# Patient Record
Sex: Male | Born: 1940 | Race: Black or African American | Hispanic: No | Marital: Single | Smoking: Former smoker
Health system: Southern US, Community
[De-identification: ages and names within clinical notes are randomized; demographics above are authoritative.]

## PROBLEM LIST (undated history)

## (undated) DIAGNOSIS — E785 Hyperlipidemia, unspecified: Secondary | ICD-10-CM

## (undated) DIAGNOSIS — H409 Unspecified glaucoma: Secondary | ICD-10-CM

## (undated) DIAGNOSIS — I10 Essential (primary) hypertension: Secondary | ICD-10-CM

## (undated) DIAGNOSIS — E119 Type 2 diabetes mellitus without complications: Secondary | ICD-10-CM

## (undated) HISTORY — PX: EYE SURGERY: SHX253

---

## 2008-04-17 ENCOUNTER — Emergency Department: Payer: Self-pay | Admitting: Emergency Medicine

## 2010-05-10 ENCOUNTER — Emergency Department: Payer: Self-pay | Admitting: Unknown Physician Specialty

## 2012-05-04 ENCOUNTER — Encounter: Payer: Self-pay | Admitting: Geriatric Medicine

## 2012-05-17 ENCOUNTER — Encounter: Payer: Self-pay | Admitting: Geriatric Medicine

## 2012-08-17 ENCOUNTER — Encounter (INDEPENDENT_AMBULATORY_CARE_PROVIDER_SITE_OTHER): Payer: Medicare Other | Admitting: Ophthalmology

## 2012-08-17 DIAGNOSIS — I10 Essential (primary) hypertension: Secondary | ICD-10-CM

## 2012-08-17 DIAGNOSIS — H2510 Age-related nuclear cataract, unspecified eye: Secondary | ICD-10-CM

## 2012-08-17 DIAGNOSIS — H348192 Central retinal vein occlusion, unspecified eye, stable: Secondary | ICD-10-CM

## 2012-08-17 DIAGNOSIS — H43819 Vitreous degeneration, unspecified eye: Secondary | ICD-10-CM

## 2012-08-17 DIAGNOSIS — E1139 Type 2 diabetes mellitus with other diabetic ophthalmic complication: Secondary | ICD-10-CM

## 2012-08-17 DIAGNOSIS — E11319 Type 2 diabetes mellitus with unspecified diabetic retinopathy without macular edema: Secondary | ICD-10-CM

## 2012-08-17 DIAGNOSIS — H35039 Hypertensive retinopathy, unspecified eye: Secondary | ICD-10-CM

## 2012-09-19 ENCOUNTER — Encounter (INDEPENDENT_AMBULATORY_CARE_PROVIDER_SITE_OTHER): Payer: Medicare Other | Admitting: Ophthalmology

## 2012-09-19 DIAGNOSIS — E1165 Type 2 diabetes mellitus with hyperglycemia: Secondary | ICD-10-CM

## 2012-09-19 DIAGNOSIS — H43819 Vitreous degeneration, unspecified eye: Secondary | ICD-10-CM

## 2012-09-19 DIAGNOSIS — H2510 Age-related nuclear cataract, unspecified eye: Secondary | ICD-10-CM

## 2012-09-19 DIAGNOSIS — E1139 Type 2 diabetes mellitus with other diabetic ophthalmic complication: Secondary | ICD-10-CM

## 2012-09-19 DIAGNOSIS — H348192 Central retinal vein occlusion, unspecified eye, stable: Secondary | ICD-10-CM

## 2012-09-19 DIAGNOSIS — E11319 Type 2 diabetes mellitus with unspecified diabetic retinopathy without macular edema: Secondary | ICD-10-CM

## 2012-09-19 DIAGNOSIS — H35039 Hypertensive retinopathy, unspecified eye: Secondary | ICD-10-CM

## 2012-09-19 DIAGNOSIS — I10 Essential (primary) hypertension: Secondary | ICD-10-CM

## 2012-09-20 ENCOUNTER — Encounter (INDEPENDENT_AMBULATORY_CARE_PROVIDER_SITE_OTHER): Payer: Medicare Other | Admitting: Ophthalmology

## 2012-10-21 ENCOUNTER — Encounter (INDEPENDENT_AMBULATORY_CARE_PROVIDER_SITE_OTHER): Payer: Medicare Other | Admitting: Ophthalmology

## 2012-10-21 DIAGNOSIS — E1139 Type 2 diabetes mellitus with other diabetic ophthalmic complication: Secondary | ICD-10-CM

## 2012-10-21 DIAGNOSIS — H35039 Hypertensive retinopathy, unspecified eye: Secondary | ICD-10-CM

## 2012-10-21 DIAGNOSIS — H348192 Central retinal vein occlusion, unspecified eye, stable: Secondary | ICD-10-CM

## 2012-10-21 DIAGNOSIS — H43819 Vitreous degeneration, unspecified eye: Secondary | ICD-10-CM

## 2012-10-21 DIAGNOSIS — E11319 Type 2 diabetes mellitus with unspecified diabetic retinopathy without macular edema: Secondary | ICD-10-CM

## 2012-10-21 DIAGNOSIS — I10 Essential (primary) hypertension: Secondary | ICD-10-CM

## 2012-12-05 ENCOUNTER — Encounter (INDEPENDENT_AMBULATORY_CARE_PROVIDER_SITE_OTHER): Payer: Medicare Other | Admitting: Ophthalmology

## 2012-12-05 DIAGNOSIS — H35039 Hypertensive retinopathy, unspecified eye: Secondary | ICD-10-CM

## 2012-12-05 DIAGNOSIS — I10 Essential (primary) hypertension: Secondary | ICD-10-CM

## 2012-12-05 DIAGNOSIS — H348192 Central retinal vein occlusion, unspecified eye, stable: Secondary | ICD-10-CM

## 2012-12-05 DIAGNOSIS — E1139 Type 2 diabetes mellitus with other diabetic ophthalmic complication: Secondary | ICD-10-CM

## 2012-12-05 DIAGNOSIS — H2510 Age-related nuclear cataract, unspecified eye: Secondary | ICD-10-CM

## 2012-12-05 DIAGNOSIS — E11319 Type 2 diabetes mellitus with unspecified diabetic retinopathy without macular edema: Secondary | ICD-10-CM

## 2012-12-05 DIAGNOSIS — H43819 Vitreous degeneration, unspecified eye: Secondary | ICD-10-CM

## 2013-12-20 ENCOUNTER — Ambulatory Visit (INDEPENDENT_AMBULATORY_CARE_PROVIDER_SITE_OTHER): Payer: Medicare Other | Admitting: Ophthalmology

## 2013-12-20 DIAGNOSIS — E11329 Type 2 diabetes mellitus with mild nonproliferative diabetic retinopathy without macular edema: Secondary | ICD-10-CM | POA: Diagnosis not present

## 2013-12-20 DIAGNOSIS — E11319 Type 2 diabetes mellitus with unspecified diabetic retinopathy without macular edema: Secondary | ICD-10-CM | POA: Diagnosis not present

## 2013-12-20 DIAGNOSIS — H35033 Hypertensive retinopathy, bilateral: Secondary | ICD-10-CM | POA: Diagnosis not present

## 2013-12-20 DIAGNOSIS — H34811 Central retinal vein occlusion, right eye: Secondary | ICD-10-CM

## 2013-12-20 DIAGNOSIS — H43813 Vitreous degeneration, bilateral: Secondary | ICD-10-CM | POA: Diagnosis not present

## 2013-12-20 DIAGNOSIS — I10 Essential (primary) hypertension: Secondary | ICD-10-CM

## 2015-01-04 ENCOUNTER — Ambulatory Visit (INDEPENDENT_AMBULATORY_CARE_PROVIDER_SITE_OTHER): Payer: Medicare Other | Admitting: Ophthalmology

## 2015-01-22 ENCOUNTER — Ambulatory Visit (INDEPENDENT_AMBULATORY_CARE_PROVIDER_SITE_OTHER): Payer: Medicare Other | Admitting: Ophthalmology

## 2015-01-22 DIAGNOSIS — H348112 Central retinal vein occlusion, right eye, stable: Secondary | ICD-10-CM | POA: Diagnosis not present

## 2015-01-22 DIAGNOSIS — H43813 Vitreous degeneration, bilateral: Secondary | ICD-10-CM | POA: Diagnosis not present

## 2015-01-22 DIAGNOSIS — H35033 Hypertensive retinopathy, bilateral: Secondary | ICD-10-CM

## 2015-01-22 DIAGNOSIS — I10 Essential (primary) hypertension: Secondary | ICD-10-CM

## 2015-01-22 DIAGNOSIS — E11319 Type 2 diabetes mellitus with unspecified diabetic retinopathy without macular edema: Secondary | ICD-10-CM | POA: Diagnosis not present

## 2015-01-22 DIAGNOSIS — E113293 Type 2 diabetes mellitus with mild nonproliferative diabetic retinopathy without macular edema, bilateral: Secondary | ICD-10-CM | POA: Diagnosis not present

## 2015-08-29 DIAGNOSIS — Z9181 History of falling: Secondary | ICD-10-CM | POA: Insufficient documentation

## 2015-09-18 DIAGNOSIS — I10 Essential (primary) hypertension: Secondary | ICD-10-CM | POA: Insufficient documentation

## 2015-09-18 DIAGNOSIS — I671 Cerebral aneurysm, nonruptured: Secondary | ICD-10-CM | POA: Insufficient documentation

## 2015-09-18 DIAGNOSIS — H8113 Benign paroxysmal vertigo, bilateral: Secondary | ICD-10-CM | POA: Insufficient documentation

## 2015-10-22 DIAGNOSIS — I700 Atherosclerosis of aorta: Secondary | ICD-10-CM | POA: Insufficient documentation

## 2015-10-28 ENCOUNTER — Encounter (INDEPENDENT_AMBULATORY_CARE_PROVIDER_SITE_OTHER): Payer: Medicare Other | Admitting: Ophthalmology

## 2015-12-04 ENCOUNTER — Ambulatory Visit: Payer: Medicare Other | Admitting: Podiatry

## 2016-01-23 ENCOUNTER — Ambulatory Visit (INDEPENDENT_AMBULATORY_CARE_PROVIDER_SITE_OTHER): Payer: Medicare Other | Admitting: Ophthalmology

## 2016-01-23 DIAGNOSIS — H43813 Vitreous degeneration, bilateral: Secondary | ICD-10-CM | POA: Diagnosis not present

## 2016-01-23 DIAGNOSIS — I10 Essential (primary) hypertension: Secondary | ICD-10-CM | POA: Diagnosis not present

## 2016-01-23 DIAGNOSIS — E11319 Type 2 diabetes mellitus with unspecified diabetic retinopathy without macular edema: Secondary | ICD-10-CM | POA: Diagnosis not present

## 2016-01-23 DIAGNOSIS — H348112 Central retinal vein occlusion, right eye, stable: Secondary | ICD-10-CM

## 2016-01-23 DIAGNOSIS — E113293 Type 2 diabetes mellitus with mild nonproliferative diabetic retinopathy without macular edema, bilateral: Secondary | ICD-10-CM | POA: Diagnosis not present

## 2016-01-23 DIAGNOSIS — H35033 Hypertensive retinopathy, bilateral: Secondary | ICD-10-CM | POA: Diagnosis not present

## 2017-01-27 ENCOUNTER — Ambulatory Visit (INDEPENDENT_AMBULATORY_CARE_PROVIDER_SITE_OTHER): Payer: Medicare Other | Admitting: Ophthalmology

## 2017-01-27 ENCOUNTER — Encounter (INDEPENDENT_AMBULATORY_CARE_PROVIDER_SITE_OTHER): Payer: Medicare Other | Admitting: Ophthalmology

## 2017-02-07 ENCOUNTER — Encounter: Payer: Self-pay | Admitting: Emergency Medicine

## 2017-02-07 ENCOUNTER — Ambulatory Visit
Admission: EM | Admit: 2017-02-07 | Discharge: 2017-02-07 | Disposition: A | Discharge status: Home / Self Care | Payer: Medicare Other | Attending: Emergency Medicine | Admitting: Emergency Medicine

## 2017-02-07 ENCOUNTER — Other Ambulatory Visit: Payer: Self-pay

## 2017-02-07 ENCOUNTER — Ambulatory Visit (INDEPENDENT_AMBULATORY_CARE_PROVIDER_SITE_OTHER): Payer: Medicare Other

## 2017-02-07 DIAGNOSIS — J069 Acute upper respiratory infection, unspecified: Secondary | ICD-10-CM | POA: Diagnosis not present

## 2017-02-07 DIAGNOSIS — R05 Cough: Secondary | ICD-10-CM | POA: Diagnosis not present

## 2017-02-07 HISTORY — DX: Type 2 diabetes mellitus without complications: E11.9

## 2017-02-07 HISTORY — DX: Unspecified glaucoma: H40.9

## 2017-02-07 HISTORY — DX: Hyperlipidemia, unspecified: E78.5

## 2017-02-07 HISTORY — DX: Essential (primary) hypertension: I10

## 2017-02-07 MED ORDER — BENZONATATE 200 MG PO CAPS: 200.0000 mg | ORAL_CAPSULE | Freq: Three times a day (TID) | ORAL | 0 refills | Status: DC | PRN

## 2017-02-07 MED ORDER — FLUTICASONE PROPIONATE 50 MCG/ACT NA SUSP: 2.0000 | Freq: Every day | NASAL | 0 refills | Status: DC

## 2017-02-07 MED ORDER — DOXYCYCLINE HYCLATE 100 MG PO CAPS: 100.0000 mg | ORAL_CAPSULE | Freq: Two times a day (BID) | ORAL | 0 refills | Status: AC

## 2017-02-07 NOTE — ED Provider Notes (Signed)
HPI  SUBJECTIVE:  Evan Gonzalez is a 76 y.o. male who presents with a nonproductive cough, nasal congestion starting yesterday.  He reports feeling feverish but has no documented fevers as he did not take his temperature.  He reports shortness of breath with the cough only denies any other shortness of breath.  He reports lower abdominal soreness from the cough.  States that he had a burning throat yesterday while coughing.  He tried Alka-Seltzer cold and flu and Vicks VapoRub.  The Alka-Seltzer helped.  No aggravating factors.  No rhinorrhea, postnasal drip, sinus pain or pressure.  No wheezing, chest pain.  No burning chest pain, belching, water brash.  No body aches, headaches.  No antibiotics in the past month.  No antipyretic in the past 6-8 hours.  Past medical history of obesity, diabetes, HTN for which he takes lisinopril.  No history of GERD, asthma, emphysema, COPD, smoking, CHF.  PMD: Harlow Ohms, MD  Past Medical History:  Diagnosis Date  . Diabetes mellitus without complication ([place])   . Glaucoma   . Hyperlipidemia   . Hypertension     Past Surgical History:  Procedure Laterality Date  . EYE SURGERY      Family History  Problem Relation Age of Onset  . Heart attack Mother   . Diabetes Father     Social History   Tobacco Use  . Smoking status: Former Smoker    Last attempt to quit: 02/08/1987    Years since quitting: 30.0  . Smokeless tobacco: Never Used  Substance Use Topics  . Alcohol use: No    Frequency: Never  . Drug use: No    No current facility-administered medications for this encounter.   Current Outpatient Medications:  .  dipyridamole-aspirin (AGGRENOX) 200-25 MG 12hr capsule, Take 1 capsule by mouth 2 (two) times daily., Disp: , Rfl:  .  dorzolamide (TRUSOPT) 2 % ophthalmic solution, Place 1 drop into both eyes 2 (two) times daily., Disp: , Rfl:  .  glipiZIDE (GLUCOTROL) 5 MG tablet, Take 5 mg by mouth daily before breakfast., Disp: , Rfl:   .  latanoprost (XALATAN) 0.005 % ophthalmic solution, Place 1 drop into both eyes at bedtime., Disp: , Rfl:  .  lisinopril (PRINIVIL,ZESTRIL) 5 MG tablet, Take 5 mg by mouth daily., Disp: , Rfl:  .  metFORMIN (GLUCOPHAGE) 500 MG tablet, Take 500 mg by mouth 2 (two) times daily with a meal., Disp: , Rfl:  .  simvastatin (ZOCOR) 20 MG tablet, Take 20 mg by mouth at bedtime., Disp: , Rfl:  .  benzonatate (TESSALON) 200 MG capsule, Take 1 capsule (200 mg total) by mouth 3 (three) times daily as needed for cough., Disp: 30 capsule, Rfl: 0 .  doxycycline (VIBRAMYCIN) 100 MG capsule, Take 1 capsule (100 mg total) by mouth 2 (two) times daily for 7 days., Disp: 14 capsule, Rfl: 0 .  fluticasone (FLONASE) 50 MCG/ACT nasal spray, Place 2 sprays into both nostrils daily., Disp: 16 g, Rfl: 0  No Known Allergies   ROS  As noted in HPI.   Physical Exam  BP 127/70 (BP Location: Left Arm)   Pulse 91   Temp 99.1 F (37.3 C) (Oral)   Resp 16   Ht 5\' 11"  (1.803 m)   Wt 266 lb (120.7 kg)   SpO2 98%   BMI 37.10 kg/m   Constitutional: Well developed, well nourished, no acute distress, obese Eyes:  EOMI, conjunctiva normal bilaterally HENT: Normocephalic, atraumatic,mucus membranes moist  positive clear rhinorrhea.  Slightly erythematous, swollen turbinates.  No sinus tenderness.  Positive cobblestoning with postnasal drip. Respiratory: Normal inspiratory effort, lungs clear bilaterally.  Good air movement.  No chest wall tenderness. Cardiovascular: Normal rate regular rhythm, no murmurs, rubs, gallops GI: nondistended soft.  No abdominal wall tenderness skin: No rash, skin intact Musculoskeletal: no deformities Neurologic: Alert & oriented x 3, no focal neuro deficits Psychiatric: Speech and behavior appropriate   ED Course   Medications - No data to display  Orders Placed This Encounter  Procedures  . DG Chest 2 View    Standing Status:   Standing    Number of Occurrences:   1    Order  Specific Question:   Reason for Exam (SYMPTOM  OR DIAGNOSIS REQUIRED)    Answer:   r/o PNA pulm edema effusion    No results found for this or any previous visit (from the past 24 hour(s)). Dg Chest 2 View  Result Date: 02/07/2017 CLINICAL DATA:  Patient in today c/o cough since yesterday. Patient states it was productive (green) last night, but has been a dry cough today. Patient felt feverish yesterday, but didn't check his temperature. Patient has tried Copywriter, advertising Flu/Cold but unsure if that has helped. Pt also states he has coughed so much his lower abdomen hurts. Former smoker. EXAM: CHEST  2 VIEW COMPARISON:  None. FINDINGS: Cardiac silhouette is normal in size. No mediastinal or hilar masses. No evidence of adenopathy. Lungs are clear.  No pleural effusion or pneumothorax. The skeletal structures are intact. IMPRESSION: No active cardiopulmonary disease. Electronically Signed   By: Lajean Manes M.D.   On: 02/07/2017 16:15    ED Clinical Impression  Upper respiratory tract infection, unspecified type   ED Assessment/Plan  Reviewed imaging independently and discussed with rads.  No acute cardiopulmonary disease per radiology.  see radiology report for full details. However I see a hyperdensity on the lateral x ray-may be chronic bronchitic changes per radiology.  Patient afebrile, satting well on room air, has not taken antipyretic in the past 6-8 hours.  Presentation consistent with a URI, will send home with Tessalon, Flonase.  We will send home with a wait-and-see prescription of doxycycline 100 mg p.o. twice daily for 7 days because of the x-ray.  He will follow-up with his primary care physician in several days, and will go to the ER if he gets worse despite the Tessalon, Flonase and the doxycycline.  Discussed imaging, MDM, plan and followup with patient. Discussed sn/sx that should prompt return to the ED. patient agrees with plan.   Meds ordered this encounter  Medications   . doxycycline (VIBRAMYCIN) 100 MG capsule    Sig: Take 1 capsule (100 mg total) by mouth 2 (two) times daily for 7 days.    Dispense:  14 capsule    Refill:  0  . benzonatate (TESSALON) 200 MG capsule    Sig: Take 1 capsule (200 mg total) by mouth 3 (three) times daily as needed for cough.    Dispense:  30 capsule    Refill:  0  . fluticasone (FLONASE) 50 MCG/ACT nasal spray    Sig: Place 2 sprays into both nostrils daily.    Dispense:  16 g    Refill:  0    *This clinic note was created using [employer]. Therefore, there may be occasional mistakes despite careful proofreading.   ?   Melynda Ripple, MD 02/07/17 1729

## 2017-02-07 NOTE — ED Triage Notes (Signed)
Patient states he has coughed so much, that his abdomen is sore. Rates pain 10/10

## 2017-02-07 NOTE — ED Triage Notes (Signed)
Patient in today c/o cough since yesterday. Patient states it was productive (green) last night, but has been a dry cough today. Patient felt feverish yesterday, but didn't check his temperature. Patient has tried Copywriter, advertising Flu/Cold but unsure if that has helped.

## 2017-02-07 NOTE — Discharge Instructions (Signed)
Start some Mucinex in addition to your cold medicines.  Tessalon for the cough, Flonase for the nasal congestion.  Wait to start the doxycycline.

## 2017-02-10 DIAGNOSIS — M75101 Unspecified rotator cuff tear or rupture of right shoulder, not specified as traumatic: Secondary | ICD-10-CM | POA: Insufficient documentation

## 2017-03-04 ENCOUNTER — Encounter (INDEPENDENT_AMBULATORY_CARE_PROVIDER_SITE_OTHER): Payer: Medicare Other | Admitting: Ophthalmology

## 2017-03-04 DIAGNOSIS — E113293 Type 2 diabetes mellitus with mild nonproliferative diabetic retinopathy without macular edema, bilateral: Secondary | ICD-10-CM | POA: Diagnosis not present

## 2017-03-04 DIAGNOSIS — I10 Essential (primary) hypertension: Secondary | ICD-10-CM | POA: Diagnosis not present

## 2017-03-04 DIAGNOSIS — H35033 Hypertensive retinopathy, bilateral: Secondary | ICD-10-CM | POA: Diagnosis not present

## 2017-03-04 DIAGNOSIS — E11319 Type 2 diabetes mellitus with unspecified diabetic retinopathy without macular edema: Secondary | ICD-10-CM

## 2017-03-04 DIAGNOSIS — H348112 Central retinal vein occlusion, right eye, stable: Secondary | ICD-10-CM | POA: Diagnosis not present

## 2017-03-04 DIAGNOSIS — H43813 Vitreous degeneration, bilateral: Secondary | ICD-10-CM | POA: Diagnosis not present

## 2017-09-14 ENCOUNTER — Ambulatory Visit
Admission: EM | Admit: 2017-09-14 | Discharge: 2017-09-14 | Disposition: A | Discharge status: Home / Self Care | Payer: Medicare Other | Attending: Family Medicine | Admitting: Family Medicine

## 2017-09-14 ENCOUNTER — Ambulatory Visit (INDEPENDENT_AMBULATORY_CARE_PROVIDER_SITE_OTHER): Payer: Medicare Other

## 2017-09-14 ENCOUNTER — Other Ambulatory Visit: Payer: Self-pay

## 2017-09-14 ENCOUNTER — Encounter: Payer: Self-pay | Admitting: Emergency Medicine

## 2017-09-14 DIAGNOSIS — M25559 Pain in unspecified hip: Secondary | ICD-10-CM

## 2017-09-14 DIAGNOSIS — M25551 Pain in right hip: Secondary | ICD-10-CM | POA: Diagnosis not present

## 2017-09-14 NOTE — ED Triage Notes (Signed)
Patient c/o right hip pain that goes down his right leg for the past 3 days.

## 2017-09-14 NOTE — ED Provider Notes (Signed)
[facility]    CSN: [id number] Arrival date & time: 09/14/17  1449     History   Chief Complaint Chief Complaint  Patient presents with  . Hip Pain    right    HPI Evan Gonzalez is a 77 y.o. male.   77 yo male with a c/o right hip pain radiating down the leg for 3 days. Denies any falls or other traumatic injury. Denies fevers, chills, or rash.   The history is provided by the patient.  Hip Pain     Past Medical History:  Diagnosis Date  . Diabetes mellitus without complication ([place])   . Glaucoma   . Hyperlipidemia   . Hypertension     There are no active problems to display for this patient.   Past Surgical History:  Procedure Laterality Date  . EYE SURGERY         Home Medications    Prior to Admission medications   Medication Sig Start Date End Date Taking? Authorizing Provider  dipyridamole-aspirin (AGGRENOX) 200-25 MG 12hr capsule Take 1 capsule by mouth 2 (two) times daily.   Yes [provider]  dorzolamide (TRUSOPT) 2 % ophthalmic solution Place 1 drop into both eyes 2 (two) times daily.   Yes [provider]  glipiZIDE (GLUCOTROL) 5 MG tablet Take 5 mg by mouth daily before breakfast.   Yes [provider]  latanoprost (XALATAN) 0.005 % ophthalmic solution Place 1 drop into both eyes at bedtime.   Yes [provider]  lisinopril (PRINIVIL,ZESTRIL) 5 MG tablet Take 5 mg by mouth daily.   Yes [provider]  metFORMIN (GLUCOPHAGE) 500 MG tablet Take 500 mg by mouth 2 (two) times daily with a meal.   Yes [provider]  simvastatin (ZOCOR) 20 MG tablet Take 20 mg by mouth at bedtime.   Yes [provider]  benzonatate (TESSALON) 200 MG capsule Take 1 capsule (200 mg total) by mouth 3 (three) times daily as needed for cough. 02/07/17   Melynda Ripple, MD  fluticasone (FLONASE) 50 MCG/ACT nasal spray Place 2 sprays into both nostrils daily. 02/07/17   Melynda Ripple,  MD    Family History Family History  Problem Relation Age of Onset  . Heart attack Mother   . Diabetes Father     Social History Social History   Tobacco Use  . Smoking status: Former Smoker    Last attempt to quit: 02/08/1987    Years since quitting: 30.6  . Smokeless tobacco: Never Used  Substance Use Topics  . Alcohol use: No    Frequency: Never  . Drug use: No     Allergies   Patient has no known allergies.   Review of Systems Review of Systems   Physical Exam Triage Vital Signs ED Triage Vitals  Enc Vitals Group     BP 09/14/17 1519 (!) 130/94     Pulse Rate 09/14/17 1519 63     Resp 09/14/17 1519 16     Temp 09/14/17 1519 98 F (36.7 C)     Temp Source 09/14/17 1519 Oral     SpO2 09/14/17 1519 98 %     Weight 09/14/17 1517 272 lb (123.4 kg)     Height 09/14/17 1517 5\' 11"  (1.803 m)     Head Circumference --      Peak Flow --      Pain Score 09/14/17 1517 10     Pain Loc --  Pain Edu? --      Excl. in [place]? --    No data found.  Updated Vital Signs BP (!) 130/94 (BP Location: Right Arm)   Pulse 63   Temp 98 F (36.7 C) (Oral)   Resp 16   Ht 5\' 11"  (1.803 m)   Wt 272 lb (123.4 kg)   SpO2 98%   BMI 37.94 kg/m   Visual Acuity Right Eye Distance:   Left Eye Distance:   Bilateral Distance:    Right Eye Near:   Left Eye Near:    Bilateral Near:     Physical Exam  Constitutional: He appears well-developed and well-nourished. No distress.  Musculoskeletal:       Right hip: He exhibits tenderness. He exhibits normal range of motion, normal strength, no bony tenderness, no swelling, no crepitus, no deformity and no laceration.  Skin: He is not diaphoretic.  Nursing note and vitals reviewed.    UC Treatments / Results  Labs (all labs ordered are listed, but only abnormal results are displayed) Labs Reviewed - No data to display  EKG None  Radiology Dg Hip Unilat With Pelvis 2-3 Views Right  Result Date: 09/14/2017 CLINICAL  DATA:  Right posterior hip pain EXAM: DG HIP (WITH OR WITHOUT PELVIS) 2-3V RIGHT COMPARISON:  None. FINDINGS: Mild SI joint degenerative change. No fracture or malalignment. Joint space is within normal limits. Pubic symphysis and rami are intact. IMPRESSION: No acute osseous abnormality. Electronically Signed   By: Donavan Foil M.D.   On: 09/14/2017 15:59    Procedures Procedures (including critical care time)  Medications Ordered in UC Medications - No data to display  Initial Impression / Assessment and Plan / UC Course  I have reviewed the triage vital signs and the nursing notes.  Pertinent labs & imaging results that were available during my care of the patient were reviewed by me and considered in my medical decision making (see chart for details).      Final Clinical Impressions(s) / UC Diagnoses   Final diagnoses:  Hip pain     Discharge Instructions     Over the counter tylenol/advil  Heat to area    ED Prescriptions    None     1. x-ray results and diagnosis reviewed with patient 2. rx as per orders above; reviewed possible side effects, interactions, risks and benefits  3. Recommend supportive treatment as above 4. Follow-up prn if symptoms worsen or don't improve   Controlled Substance Prescriptions [place] Controlled Substance Registry consulted? Not Applicable   Norval Gable, MD 09/14/17 [phone]

## 2017-09-14 NOTE — Discharge Instructions (Signed)
Over the counter tylenol/advil  Heat to area

## 2017-09-24 ENCOUNTER — Emergency Department
Admission: EM | Admit: 2017-09-24 | Discharge: 2017-09-24 | Disposition: A | Discharge status: Home / Self Care | Payer: Medicare Other | Attending: Emergency Medicine | Admitting: Emergency Medicine

## 2017-09-24 ENCOUNTER — Other Ambulatory Visit: Payer: Self-pay

## 2017-09-24 ENCOUNTER — Emergency Department: Payer: Medicare Other

## 2017-09-24 DIAGNOSIS — Z87891 Personal history of nicotine dependence: Secondary | ICD-10-CM | POA: Insufficient documentation

## 2017-09-24 DIAGNOSIS — I10 Essential (primary) hypertension: Secondary | ICD-10-CM | POA: Diagnosis not present

## 2017-09-24 DIAGNOSIS — Z7984 Long term (current) use of oral hypoglycemic drugs: Secondary | ICD-10-CM | POA: Diagnosis not present

## 2017-09-24 DIAGNOSIS — E119 Type 2 diabetes mellitus without complications: Secondary | ICD-10-CM | POA: Insufficient documentation

## 2017-09-24 DIAGNOSIS — R42 Dizziness and giddiness: Secondary | ICD-10-CM | POA: Diagnosis present

## 2017-09-24 LAB — URINALYSIS, ROUTINE W REFLEX MICROSCOPIC
BILIRUBIN URINE: NEGATIVE
GLUCOSE, UA: NEGATIVE mg/dL
HGB URINE DIPSTICK: NEGATIVE
Ketones, ur: NEGATIVE mg/dL
Leukocytes, UA: NEGATIVE
Nitrite: NEGATIVE
PROTEIN: NEGATIVE mg/dL
Specific Gravity, Urine: 1.031 — ABNORMAL HIGH (ref 1.005–1.030)
pH: 5 (ref 5.0–8.0)

## 2017-09-24 LAB — COMPREHENSIVE METABOLIC PANEL
ALBUMIN: 3.8 g/dL (ref 3.5–5.0)
ALT: 22 U/L (ref 0–44)
AST: 24 U/L (ref 15–41)
Alkaline Phosphatase: 36 U/L — ABNORMAL LOW (ref 38–126)
Anion gap: 7 (ref 5–15)
BUN: 10 mg/dL (ref 8–23)
CO2: 25 mmol/L (ref 22–32)
CREATININE: 0.88 mg/dL (ref 0.61–1.24)
Calcium: 8.8 mg/dL — ABNORMAL LOW (ref 8.9–10.3)
Chloride: 110 mmol/L (ref 98–111)
GFR calc non Af Amer: >60 mL/min (ref >60)
Glucose, Bld: 121 mg/dL — ABNORMAL HIGH (ref 70–99)
Potassium: 4 mmol/L (ref 3.5–5.1)
SODIUM: 142 mmol/L (ref 135–145)
TOTAL PROTEIN: 7.6 g/dL (ref 6.5–8.1)
Total Bilirubin: 0.7 mg/dL (ref 0.3–1.2)

## 2017-09-24 LAB — CBC
HCT: 41.7 % (ref 40.0–52.0)
Hemoglobin: 14.3 g/dL (ref 13.0–18.0)
MCH: 31.6 pg (ref 26.0–34.0)
MCHC: 34.2 g/dL (ref 32.0–36.0)
MCV: 92.2 fL (ref 80.0–100.0)
PLATELETS: 202 10*3/uL (ref 150–440)
RBC: 4.52 MIL/uL (ref 4.40–5.90)
RDW: 14.6 % — AB (ref 11.5–14.5)
WBC: 6 10*3/uL (ref 3.8–10.6)

## 2017-09-24 LAB — TROPONIN I: Troponin I: <0.03 ng/mL (ref <0.03)

## 2017-09-24 MED ORDER — MECLIZINE HCL 25 MG PO TABS: 25.0000 mg | ORAL_TABLET | Freq: Three times a day (TID) | ORAL | 0 refills | Status: DC | PRN

## 2017-09-24 MED ORDER — MECLIZINE HCL 25 MG PO TABS
25.0000 mg | ORAL_TABLET | Freq: Once | ORAL | Status: AC
  Administered 2017-09-24: 25 mg via ORAL
  Filled 2017-09-24: qty 1

## 2017-09-24 MED ORDER — SODIUM CHLORIDE 0.9 % IV BOLUS
1000.0000 mL | Freq: Once | INTRAVENOUS | Status: AC
  Administered 2017-09-24: 1000 mL via INTRAVENOUS

## 2017-09-24 NOTE — ED Notes (Signed)
This RN to bedside at this time, pt given another cup of water and encouraged to urinate. This RN offered in and out cath to get urine if patient was unable to provide specimen. Pt states he will drink water and attempt for urine sample.

## 2017-09-24 NOTE — ED Provider Notes (Signed)
[facility] Emergency Department Provider Note  Time seen: 9:23 AM  I have reviewed the triage vital signs and the nursing notes.   HISTORY  Chief Complaint Dizziness    HPI KYSON KUPPER is a 77 y.o. male with a past medical history of diabetes, hypertension, hyperlipidemia, CVA, presents to the emergency department for dizziness.  According to the patient he woke this morning around 6:00 with dizziness.  States he felt fine when he went to bed last night.  States he woke up and sat up on the side of the bed and felt very dizzy became nauseated and had an episode of vomiting.  Describes the dizziness as the room spinning around him.  Denies any headache, weakness or numbness of any arm or leg, confusion or difficulty speaking.  Patient states approximately 30 years ago he was told he had a small stroke which affected his right side at that time but completely resolved.  He states at that time he was also having dizziness but has not had any significant dizziness since then until this morning.  Patient denies any chest pain or trouble breathing, does state right hip pain but states this is a chronic issue for him.   Past Medical History:  Diagnosis Date  . Diabetes mellitus without complication ([place])   . Glaucoma   . Hyperlipidemia   . Hypertension     There are no active problems to display for this patient.   Past Surgical History:  Procedure Laterality Date  . EYE SURGERY      Prior to Admission medications   Medication Sig Start Date End Date Taking? Authorizing Provider  benzonatate (TESSALON) 200 MG capsule Take 1 capsule (200 mg total) by mouth 3 (three) times daily as needed for cough. 02/07/17   Melynda Ripple, MD  dipyridamole-aspirin (AGGRENOX) 200-25 MG 12hr capsule Take 1 capsule by mouth 2 (two) times daily.    [provider]  dorzolamide (TRUSOPT) 2 % ophthalmic solution Place 1 drop into both eyes 2 (two) times daily.     [provider]  fluticasone (FLONASE) 50 MCG/ACT nasal spray Place 2 sprays into both nostrils daily. 02/07/17   Melynda Ripple, MD  glipiZIDE (GLUCOTROL) 5 MG tablet Take 5 mg by mouth daily before breakfast.    [provider]  latanoprost (XALATAN) 0.005 % ophthalmic solution Place 1 drop into both eyes at bedtime.    [provider]  lisinopril (PRINIVIL,ZESTRIL) 5 MG tablet Take 5 mg by mouth daily.    [provider]  metFORMIN (GLUCOPHAGE) 500 MG tablet Take 500 mg by mouth 2 (two) times daily with a meal.    [provider]  simvastatin (ZOCOR) 20 MG tablet Take 20 mg by mouth at bedtime.    [provider]    No Known Allergies  Family History  Problem Relation Age of Onset  . Heart attack Mother   . Diabetes Father     Social History Social History   Tobacco Use  . Smoking status: Former Smoker    Last attempt to quit: 02/08/1987    Years since quitting: 30.6  . Smokeless tobacco: Never Used  Substance Use Topics  . Alcohol use: No    Frequency: Never  . Drug use: No    Review of Systems Constitutional: Negative for fever.  Positive for dizziness. Eyes: Felt like the room is spinning. ENT: Negative for recent illness/congestion or ear pain. Cardiovascular: Negative for chest pain. Respiratory: Negative for  shortness of breath. Gastrointestinal: Negative for abdominal pain.  One episode of nausea vomiting this morning. Genitourinary: Negative for urinary compaints Musculoskeletal: Chronic right hip pain Skin: Negative for skin complaints  Neurological: Negative for headache, denies weakness or numbness. All other ROS negative  ____________________________________________   PHYSICAL EXAM:  VITAL SIGNS: ED Triage Vitals  Enc Vitals Group     BP 09/24/17 0907 132/74     Pulse Rate 09/24/17 0905 (!) 57     Resp 09/24/17 0905 16     Temp 09/24/17 0905 98.5 F (36.9 C)     Temp Source 09/24/17 0905  Oral     SpO2 09/24/17 0905 97 %     Weight 09/24/17 0906 278 lb (126.1 kg)     Height 09/24/17 0906 5\' 11"  (1.803 m)     Head Circumference --      Peak Flow --      Pain Score 09/24/17 0906 0     Pain Loc --      Pain Edu? --      Excl. in [place]? --    Constitutional: Alert and oriented. Well appearing and in no distress. Eyes: Normal exam ENT   Head: Normocephalic and atraumatic.   Mouth/Throat: Mucous membranes are moist. Cardiovascular: Normal rate, regular rhythm. No murmur Respiratory: Normal respiratory effort without tachypnea nor retractions. Breath sounds are clear Gastrointestinal: Soft and nontender. No distention.  Musculoskeletal: Nontender with normal range of motion in all extremities.  Neurologic:  Normal speech and language. No gross focal neurologic deficits.  Equal grip strengths.  No pronator drift.  5/5 motor in all extremities.  Cranial nerves intact. Skin:  Skin is warm, dry and intact.  Psychiatric: Mood and affect are normal.   ____________________________________________    EKG  EKG reviewed and interpreted by myself shows normal sinus rhythm at 61 bpm with a narrow QRS, left axis deviation, largely normal intervals with no concerning ST changes.  ____________________________________________    RADIOLOGY  CT had negative  ____________________________________________   INITIAL IMPRESSION / ASSESSMENT AND PLAN / ED COURSE  Pertinent labs & imaging results that were available during my care of the patient were reviewed by me and considered in my medical decision making (see chart for details).  Patient presents to the emergency department acute onset of dizziness after sitting up on the side of his bed this morning.  Describes the dizziness as a room spinning sensation.  Denies any headache confusion slurred speech weakness or numbness.  Normal neurological examination.  NIH stroke scale of 0 currently.  We will check labs, CT scan of the head  and continue to closely monitor.  Overall the patient appears well.  We will dose meclizine in the emergency department.  CT had negative for acute abnormality.  Labs are largely at baseline.  Highly suspect vertigo given the patient's description of symptoms.  States the dizziness has completely resolved at this time.  We will discharge with a course of vertigo.  I did discuss strict return precautions.  ____________________________________________   FINAL CLINICAL IMPRESSION(S) / ED DIAGNOSES  Dizziness    Harvest Dark, MD 09/24/17 1447

## 2017-09-24 NOTE — ED Notes (Signed)
Return from CT.  AAOx3.  Skin warm and dry. NAD

## 2017-09-24 NOTE — ED Notes (Signed)
This RN to bedside, pt back to bed, states unable to produce urine sample. Pt is alert and oriented, NAD noted at this time. Pt denies any needs. Will continue to monitor for further patient needs.

## 2017-09-24 NOTE — ED Notes (Signed)
Bladder scan performed per MD request, 109cc's in patient's bladder at this time. Pt denies any further needs. Encouraged urine sample when patient is able. Pt states understanding. MD made aware.

## 2017-09-24 NOTE — ED Notes (Signed)
UA collected by this RN, noted to be amber in appearance. MD notified.

## 2017-09-24 NOTE — ED Triage Notes (Signed)
Pt states when he was getting up out of bed this morning he had dizziness with N/V.Marland Kitchen Denies any pain or numbness.

## 2017-09-24 NOTE — ED Notes (Signed)
NAD noted at time of D/C. Pt denies questions or concerns. Pt ambulatory to the lobby at this time. PT refused wheelchair to the lobby.

## 2017-09-24 NOTE — ED Notes (Signed)
This RN to bedside to introduce self to patient, patient currently still in the bathroom with family at bedside. Will continue to monitor for further patient needs.

## 2017-10-06 DIAGNOSIS — M5441 Lumbago with sciatica, right side: Secondary | ICD-10-CM | POA: Insufficient documentation

## 2017-10-06 DIAGNOSIS — H6122 Impacted cerumen, left ear: Secondary | ICD-10-CM | POA: Insufficient documentation

## 2017-10-13 ENCOUNTER — Ambulatory Visit: Payer: Medicare Other | Attending: Gastroenterology | Admitting: Physical Therapy

## 2017-10-13 DIAGNOSIS — M25651 Stiffness of right hip, not elsewhere classified: Secondary | ICD-10-CM

## 2017-10-13 DIAGNOSIS — M5441 Lumbago with sciatica, right side: Secondary | ICD-10-CM | POA: Diagnosis present

## 2017-10-13 DIAGNOSIS — G8929 Other chronic pain: Secondary | ICD-10-CM | POA: Diagnosis present

## 2017-10-13 DIAGNOSIS — R262 Difficulty in walking, not elsewhere classified: Secondary | ICD-10-CM

## 2017-10-13 DIAGNOSIS — M6281 Muscle weakness (generalized): Secondary | ICD-10-CM | POA: Diagnosis present

## 2017-10-13 NOTE — Patient Instructions (Signed)
Access Code: [email or web address]  URL: [email or web address]  Date: 10/13/2017  Prepared by: Dorcas Carrow   Exercises  Standing Hamstring Stretch with Step - 3 reps - 1 sets - 20 hold - 1x daily - 7x weekly  Supine Active Straight Leg Raise - 15 reps - 2 sets - 1x daily - 7x weekly  Supine Lower Trunk Rotation - 10 reps - 1 sets - 1x daily - 7x weekly  Supine Figure 4 Piriformis Stretch - 3 reps - 1 sets - 20 hold - 1x daily - 7x weekly  Supine Piriformis Stretch with Leg Straight - 3 reps - 1 sets - 20 hold - 1x daily - 7x weekly

## 2017-10-15 NOTE — Therapy (Addendum)
[facility] [address] [place], [place], [address] Phone: [phone]   Fax:  [phone]  Physical Therapy Evaluation  Patient Details  Name: Evan Gonzalez MRN: [id number] Date of Birth: [birth year] Referring Provider: Rolene Arbour, NP   Encounter Date: 10/13/2017  PT End of Session - 10/24/17 1744    Visit Number  1    Number of Visits  8    Date for PT Re-Evaluation  11/10/17    PT Start Time  2878    PT Stop Time  1619    PT Time Calculation (min)  67 min    Activity Tolerance  Patient tolerated treatment well;No increased pain    Behavior During Therapy  WFL for tasks assessed/performed       Past Medical History:  Diagnosis Date  . Diabetes mellitus without complication ([place])   . Glaucoma   . Hyperlipidemia   . Hypertension     Past Surgical History:  Procedure Laterality Date  . EYE SURGERY      There were no vitals filed for this visit.   Subjective Assessment - 10/24/17 1248    Subjective  Pt. reports increase R sided low back pain.  Pt. reports no MOI and states he exercises at [employer] several days/week.  Pt. reports using 2 pillows on couch and plays X-box on a regular basis.      Pertinent History  Pt. received PT in past for shoulder issues with good results.  Pt. very motivated to get better/ pain-free.      Limitations  Sitting;Lifting;Standing;Walking    Patient Stated Goals  Decrease back pain/ return to gym with no limitations.      Currently in Pain?  Yes    Pain Score  2     Pain Location  Back    Pain Orientation  Right           See HEP    PT Education - 10/24/17 1744    Education Details  See HEP    Person(s) Educated  Patient    Methods  Explanation;Demonstration;Handout    Comprehension  Verbalized understanding;Returned demonstration          PT Long Term Goals - 10/24/17 1755      PT LONG TERM GOAL #1   Title  Pt. I with HEP to increase B hip  flexion strength 1/2 muscle grade to improve walking/ functional mobility.     Baseline  B hip flexion strength grossly 4/5 MMT    Time  4    Period  Weeks    Status  New    Target Date  11/10/17      PT LONG TERM GOAL #2   Title  Pt. will increase FOTO to 68 to improve pain-free mobility.     Baseline  FOTO baseline on 8/28 is 52.      Time  4    Period  Weeks    Status  New    Target Date  11/10/17      PT LONG TERM GOAL #3   Title  Pt. able to complete gym based ex. program at [employer] with no c/o R low back pain to promote return to PLOF.      Baseline  pain limited with lumbar flexion/ certain ex. at gym.     Time  4    Period  Weeks    Status  New    Target Date  11/10/17  Plan - 10/24/17 1745    Clinical Impression Statement  Pt. is a pleasant 77 y/o active male with acute right-sided low back pain with right-sided sciatica.  Pt. c/o 0-2/10 R low back pain at rest and 10/10 at worst.  Lumbar AROM: flexion 25% limited/ extension 50% limited/ increase R hip discomfort with lumbar rotn/ lateral flexion.  B LE AROM WFL and strength grossly 5/5 MMT except hip flexion 4/5 MMT.  (-) SLR.  B hamstring flexibility 50 deg.  Minimal tenderness to R lumbar paraspinals/ R hip greater trochanter.  Slight R antalgic gait pattern with initial steps after standing.  PT addressed proper seated/standing/sleeping posture.  Pt. prefers L side sleeping.  Pt. will benefit from short-term skilled PT services to increase lumbar/LE flexibility and core stability to improve pain-free mobility.      Clinical Presentation  Stable    Clinical Decision Making  Low    Rehab Potential  Good    PT Frequency  2x / week    PT Duration  4 weeks    PT Treatment/Interventions  ADLs/Self Care Home Management;Aquatic Therapy;Cryotherapy;Electrical Stimulation;Moist Heat;Stair training;Functional mobility training;Gait training;Neuromuscular re-education;Balance training;Therapeutic  exercise;Therapeutic activities;Patient/family education;Manual techniques;Dry needling;Passive range of motion    PT Next Visit Plan  Reassess HEP/ progress as tolerated.      PT Home Exercise Plan  See handouts.        Patient will benefit from skilled therapeutic intervention in order to improve the following deficits and impairments:  Abnormal gait, Decreased endurance, Decreased mobility, Hypomobility, Obesity, Decreased activity tolerance, Decreased strength, Impaired flexibility, Postural dysfunction, Pain  Visit Diagnosis: Chronic right-sided low back pain with right-sided sciatica  Difficulty in walking, not elsewhere classified  Muscle weakness (generalized)  Hip joint stiffness, right     Problem List There are no active problems to display for this patient.  Pura Spice, PT, DPT # [phone] 10/24/2017, 5:59 PM  [facility] [address] [place], [place], [address] Phone: [phone]   Fax:  [phone]  Name: Evan Gonzalez MRN: [id number] Date of Birth: [birth year]

## 2017-10-20 ENCOUNTER — Ambulatory Visit: Payer: Medicare Other | Attending: Gastroenterology | Admitting: Physical Therapy

## 2017-10-20 DIAGNOSIS — R262 Difficulty in walking, not elsewhere classified: Secondary | ICD-10-CM | POA: Insufficient documentation

## 2017-10-20 DIAGNOSIS — M25651 Stiffness of right hip, not elsewhere classified: Secondary | ICD-10-CM | POA: Diagnosis present

## 2017-10-20 DIAGNOSIS — M5441 Lumbago with sciatica, right side: Secondary | ICD-10-CM | POA: Insufficient documentation

## 2017-10-20 DIAGNOSIS — G8929 Other chronic pain: Secondary | ICD-10-CM | POA: Insufficient documentation

## 2017-10-20 DIAGNOSIS — M6281 Muscle weakness (generalized): Secondary | ICD-10-CM | POA: Insufficient documentation

## 2017-10-24 ENCOUNTER — Encounter: Payer: Self-pay | Admitting: Physical Therapy

## 2017-10-24 NOTE — Addendum Note (Signed)
Addended by: Pura Spice on: 10/24/2017 06:03 PM   Modules accepted: Orders

## 2017-10-24 NOTE — Therapy (Signed)
[facility] [address] [place], [place], [address] Phone: [phone]   Fax:  [phone]  Physical Therapy Treatment  Patient Details  Name: Evan Gonzalez MRN: [id number] Date of Birth: [birth year] Referring Provider: Rolene Arbour, NP   Encounter Date: 10/20/2017  PT End of Session - 10/24/17 1811    Visit Number  2    Number of Visits  8    Date for PT Re-Evaluation  11/10/17    PT Start Time  0807    PT Stop Time  0902    PT Time Calculation (min)  55 min    Activity Tolerance  Patient tolerated treatment well;No increased pain    Behavior During Therapy  WFL for tasks assessed/performed       Past Medical History:  Diagnosis Date  . Diabetes mellitus without complication ([place])   . Glaucoma   . Hyperlipidemia   . Hypertension     Past Surgical History:  Procedure Laterality Date  . EYE SURGERY      There were no vitals filed for this visit.  Subjective Assessment - 10/24/17 1808    Subjective  Pt. states he is doing better since starting HEP/ stretches.  Pt. went to [employer] yesterday and had several questions (PT answered).      Pertinent History  Pt. received PT in past for shoulder issues with good results.  Pt. very motivated to get better/ pain-free.      Limitations  Sitting;Lifting;Standing;Walking    Patient Stated Goals  Decrease back pain/ return to gym with no limitations.      Currently in Pain?  No/denies           Manual tx.:  Supine LE/lumbar stretches (generalized with focus on R hip mobility)- 19 min.    There.ex.:  Reviewed HEP.  Scifit L7 10 min. B UE/LE (consistent cadence)- no pain reported.     PT Long Term Goals - 10/24/17 1755      PT LONG TERM GOAL #1   Title  Pt. I with HEP to increase B hip flexion strength 1/2 muscle grade to improve walking/ functional mobility.     Baseline  B hip flexion strength grossly 4/5 MMT    Time  4    Period  Weeks    Status  New     Target Date  11/10/17      PT LONG TERM GOAL #2   Title  Pt. will increase FOTO to 68 to improve pain-free mobility.     Baseline  FOTO baseline on 8/28 is 52.      Time  4    Period  Weeks    Status  New    Target Date  11/10/17      PT LONG TERM GOAL #3   Title  Pt. able to complete gym based ex. program at [employer] with no c/o R low back pain to promote return to PLOF.      Baseline  pain limited with lumbar flexion/ certain ex. at gym.     Time  4    Period  Weeks    Status  New    Target Date  11/10/17            Plan - 10/24/17 1811    Clinical Impression Statement  Good technique with hip/lumbar stretches in supine position.  Pt. had several questions about gym based ex./ [employer] machines that PT answered to ensure proper  posture/ back support.  No change to HEP at this time.      Clinical Presentation  Stable    Clinical Decision Making  Low    Rehab Potential  Good    PT Frequency  2x / week    PT Duration  4 weeks    PT Treatment/Interventions  ADLs/Self Care Home Management;Aquatic Therapy;Cryotherapy;Electrical Stimulation;Moist Heat;Stair training;Functional mobility training;Gait training;Neuromuscular re-education;Balance training;Therapeutic exercise;Therapeutic activities;Patient/family education;Manual techniques;Dry needling;Passive range of motion    PT Next Visit Plan  Reassess HEP/ progress as tolerated.  Discuss pool ex. program.      PT Home Exercise Plan  See handouts.        Patient will benefit from skilled therapeutic intervention in order to improve the following deficits and impairments:  Abnormal gait, Decreased endurance, Decreased mobility, Hypomobility, Obesity, Decreased activity tolerance, Decreased strength, Impaired flexibility, Postural dysfunction, Pain  Visit Diagnosis: Chronic right-sided low back pain with right-sided sciatica  Difficulty in walking, not elsewhere classified  Muscle weakness (generalized)  Hip  joint stiffness, right     Problem List There are no active problems to display for this patient.  Pura Spice, PT, DPT # [phone] 10/24/2017, 6:14 PM  [facility] [address] [place], [place], [address] Phone: [phone]   Fax:  [phone]  Name: Evan Gonzalez MRN: [id number] Date of Birth: May 30, 1940

## 2017-10-25 ENCOUNTER — Ambulatory Visit: Payer: Medicare Other | Admitting: Physical Therapy

## 2017-10-25 ENCOUNTER — Encounter: Payer: Self-pay | Admitting: Physical Therapy

## 2017-10-25 DIAGNOSIS — M5441 Lumbago with sciatica, right side: Principal | ICD-10-CM

## 2017-10-25 DIAGNOSIS — R262 Difficulty in walking, not elsewhere classified: Secondary | ICD-10-CM

## 2017-10-25 DIAGNOSIS — G8929 Other chronic pain: Secondary | ICD-10-CM

## 2017-10-25 DIAGNOSIS — M25651 Stiffness of right hip, not elsewhere classified: Secondary | ICD-10-CM

## 2017-10-25 DIAGNOSIS — M6281 Muscle weakness (generalized): Secondary | ICD-10-CM

## 2017-10-25 NOTE — Therapy (Addendum)
[facility] [address] [place], [place], [address] Phone: [phone]   Fax:  [phone]  Physical Therapy Treatment  Patient Details  Name: Evan Gonzalez MRN: [id number] Date of Birth: 1940/12/28 Referring Provider: Rolene Arbour, NP   Encounter Date: 10/25/2017    Treatment: 3 of 8.  Recert date:  1/95/09  8:08AM to 9:02AM    Past Medical History:  Diagnosis Date  . Diabetes mellitus without complication ([place])   . Glaucoma   . Hyperlipidemia   . Hypertension     Past Surgical History:  Procedure Laterality Date  . EYE SURGERY      There were no vitals filed for this visit.     Pt. reports no low back pain at this time. Pt. states when he sits too long watching TV his back pain with increase to 5/10.        There.ex.:  Scifit L7 10 min. B UE/LE (no charge/ warm-up). Standing lumbar AROM (all planes) Standing hip ex. Program (no wt.) focusing on proper technique (hip flexion/ abduction/ extension/ knee flexion/ heel raises) 20x each. Reviewed gym based ex. At [place] tx.:  Supine LE/lumbar stretches (19 min.) L/R sidelying STM to lumbar paraspinals.  Pt. Limited with prone position.            Pt. has slight back discomfort with bed mobility on mat table and returning to seated posture. PT instructed pt. in proper rolling technique with UE assist to sit on edge of mat table with proper posture. Pt. able to ambulate around PT clinic with no c/o back discomfort reported. PT reviewed gym based ex. program and discussed the importance of trying aquatic ex.     PT Long Term Goals - 10/24/17 1755      PT LONG TERM GOAL #1   Title  Pt. I with HEP to increase B hip flexion strength 1/2 muscle grade to improve walking/ functional mobility.     Baseline  B hip flexion strength grossly 4/5 MMT    Time  4    Period  Weeks    Status  New    Target Date  11/10/17      PT LONG  TERM GOAL #2   Title  Pt. will increase FOTO to 68 to improve pain-free mobility.     Baseline  FOTO baseline on 8/28 is 52.      Time  4    Period  Weeks    Status  New    Target Date  11/10/17      PT LONG TERM GOAL #3   Title  Pt. able to complete gym based ex. program at [employer] with no c/o R low back pain to promote return to PLOF.      Baseline  pain limited with lumbar flexion/ certain ex. at gym.     Time  4    Period  Weeks    Status  New    Target Date  11/10/17        Patient will benefit from skilled therapeutic intervention in order to improve the following deficits and impairments:  Abnormal gait, Decreased endurance, Decreased mobility, Hypomobility, Obesity, Decreased activity tolerance, Decreased strength, Impaired flexibility, Postural dysfunction, Pain  Visit Diagnosis: Chronic right-sided low back pain with right-sided sciatica  Difficulty in walking, not elsewhere classified  Muscle weakness (generalized)  Hip joint stiffness, right     Problem List There are no active problems  to display for this patient.  Pura Spice, PT, DPT # [phone] 11/03/2017, 7:10 AM  [facility] [address] [place], [place], [address] Phone: [phone]   Fax:  [phone]  Name: Evan Gonzalez MRN: [id number] Date of Birth: [birth year]

## 2017-11-01 ENCOUNTER — Ambulatory Visit: Payer: Medicare Other | Admitting: Physical Therapy

## 2017-11-01 ENCOUNTER — Encounter: Payer: Self-pay | Admitting: Physical Therapy

## 2017-11-01 DIAGNOSIS — M5441 Lumbago with sciatica, right side: Principal | ICD-10-CM

## 2017-11-01 DIAGNOSIS — M25651 Stiffness of right hip, not elsewhere classified: Secondary | ICD-10-CM

## 2017-11-01 DIAGNOSIS — G8929 Other chronic pain: Secondary | ICD-10-CM

## 2017-11-01 DIAGNOSIS — R262 Difficulty in walking, not elsewhere classified: Secondary | ICD-10-CM

## 2017-11-01 DIAGNOSIS — M6281 Muscle weakness (generalized): Secondary | ICD-10-CM

## 2017-11-03 NOTE — Therapy (Signed)
[place] [facility] [address] [place], [place], [address] Phone: [phone]   Fax:  [phone]  Physical Therapy Treatment  Patient Details  Name: Evan Gonzalez MRN: [id number] Date of Birth: 06-16-1940 Referring Provider: Rolene Arbour, NP   Encounter Date: 11/01/2017  PT End of Session - 11/03/17 0743    Visit Number  4    Number of Visits  8    Date for PT Re-Evaluation  11/10/17    PT Start Time  0814    PT Stop Time  0910    PT Time Calculation (min)  56 min    Activity Tolerance  Patient tolerated treatment well;No increased pain    Behavior During Therapy  WFL for tasks assessed/performed       Past Medical History:  Diagnosis Date  . Diabetes mellitus without complication ([place])   . Glaucoma   . Hyperlipidemia   . Hypertension     Past Surgical History:  Procedure Laterality Date  . EYE SURGERY      There were no vitals filed for this visit.  Subjective Assessment - 11/03/17 0716    Subjective  Pt. reports going to [employer] yesterday (Sunday) morning to complete bike/ LE ex. program.  No pain at this time.      Pertinent History  Pt. received PT in past for shoulder issues with good results.  Pt. very motivated to get better/ pain-free.      Limitations  Sitting;Lifting;Standing;Walking    Patient Stated Goals  Decrease back pain/ return to gym with no limitations.      Currently in Pain?  No/denies        There.ex.:  Scifit L7 10 min. B UE/LE (no charge/ warm-up). Standing hip ex. 4# ankle wts. (all planes)- 20x each Supine TrA ex. Program (page #1-2).     Manual tx.:  Supine LE/lumbar stretches (16 min.)       PT Long Term Goals - 10/24/17 1755      PT LONG TERM GOAL #1   Title  Pt. I with HEP to increase B hip flexion strength 1/2 muscle grade to improve walking/ functional mobility.     Baseline  B hip flexion strength grossly 4/5 MMT    Time  4    Period  Weeks    Status   New    Target Date  11/10/17      PT LONG TERM GOAL #2   Title  Pt. will increase FOTO to 68 to improve pain-free mobility.     Baseline  FOTO baseline on 8/28 is 52.      Time  4    Period  Weeks    Status  New    Target Date  11/10/17      PT LONG TERM GOAL #3   Title  Pt. able to complete gym based ex. program at [employer] with no c/o R low back pain to promote return to PLOF.      Baseline  pain limited with lumbar flexion/ certain ex. at gym.     Time  4    Period  Weeks    Status  New    Target Date  11/10/17          Plan - 11/03/17 0743    Clinical Impression Statement  Great tx. session with progression of core/LE ex. program.  Pt. requires cuing for TrA muscle contraction to progression of LE ex.  Pt. improving  B hamstring flexibility with no increase c/o back pain.  Minimal tenderness with palpation along L4-5 belt line.      Clinical Presentation  Stable    Clinical Decision Making  Low    Rehab Potential  Good    PT Frequency  2x / week    PT Duration  4 weeks    PT Treatment/Interventions  ADLs/Self Care Home Management;Aquatic Therapy;Cryotherapy;Electrical Stimulation;Moist Heat;Stair training;Functional mobility training;Gait training;Neuromuscular re-education;Balance training;Therapeutic exercise;Therapeutic activities;Patient/family education;Manual techniques;Dry needling;Passive range of motion    PT Next Visit Plan  Reassess HEP/ progress as tolerated.  Discuss pool ex. program.      PT Home Exercise Plan  See handouts.        Patient will benefit from skilled therapeutic intervention in order to improve the following deficits and impairments:  Abnormal gait, Decreased endurance, Decreased mobility, Hypomobility, Obesity, Decreased activity tolerance, Decreased strength, Impaired flexibility, Postural dysfunction, Pain  Visit Diagnosis: Chronic right-sided low back pain with right-sided sciatica  Difficulty in walking, not elsewhere  classified  Muscle weakness (generalized)  Hip joint stiffness, right     Problem List There are no active problems to display for this patient.  Pura Spice, PT, DPT # [phone] 11/03/2017, 7:48 AM  [place] [facility] [address] [place], [place], [address] Phone: [phone]   Fax:  [phone]  Name: Evan Gonzalez MRN: [id number] Date of Birth: Jun 23, 1940

## 2017-11-08 ENCOUNTER — Encounter: Payer: Self-pay | Admitting: Physical Therapy

## 2017-11-08 ENCOUNTER — Ambulatory Visit: Payer: Medicare Other | Admitting: Physical Therapy

## 2017-11-08 DIAGNOSIS — M6281 Muscle weakness (generalized): Secondary | ICD-10-CM

## 2017-11-08 DIAGNOSIS — M5441 Lumbago with sciatica, right side: Secondary | ICD-10-CM | POA: Diagnosis not present

## 2017-11-08 DIAGNOSIS — R262 Difficulty in walking, not elsewhere classified: Secondary | ICD-10-CM

## 2017-11-08 DIAGNOSIS — G8929 Other chronic pain: Secondary | ICD-10-CM

## 2017-11-08 DIAGNOSIS — M25651 Stiffness of right hip, not elsewhere classified: Secondary | ICD-10-CM

## 2017-11-08 NOTE — Therapy (Signed)
[place] [facility] [address] [place], [place], [address] Phone: [phone]   Fax:  [phone]  Physical Therapy Treatment  Patient Details  Name: Evan Gonzalez MRN: [id number] Date of Birth: 09/11/1940 Referring Provider: Elizabeth Prata, NP   Encounter Date: 11/08/2017  PT End of Session - 11/08/17 0807    Visit Number  5    Number of Visits  8    Date for PT Re-Evaluation  11/10/17    PT Start Time  0807    PT Stop Time  0850    PT Time Calculation (min)  43 min    Activity Tolerance  Patient tolerated treatment well;No increased pain    Behavior During Therapy  WFL for tasks assessed/performed       Past Medical History:  Diagnosis Date  . Diabetes mellitus without complication ([facility])   . Glaucoma   . Hyperlipidemia   . Hypertension     Past Surgical History:  Procedure Laterality Date  . EYE SURGERY      There were no vitals filed for this visit.  Subjective Assessment - 11/08/17 0802    Subjective  Pt. states he is doing well.  Pt. went to [employer] this morning already and reports no pain issue.      Pertinent History  Pt. received PT in past for shoulder issues with good results.  Pt. very motivated to get better/ pain-free.      Limitations  Sitting;Lifting;Standing;Walking    Patient Stated Goals  Decrease back pain/ return to gym with no limitations.      Currently in Pain?  No/denies         There.ex.:  Scifit L7 8 min. B UE/LE (no charge/ warm-up). Standing lumbar flexion/ ext./ rotn.   Resisted walking in //-bars: 2BTB 7x lateral/ forward with light to no UE assist. Supine TrA ex. Program (page #1-2)- reviewed HEP.   Discuss [employer] ex. Equipment/ Aquatic ex.       Manual tx.:  Supine LE/lumbar stretches (14 min.).  Piriformis/ hamstring/ ITB/ trunk rotn.   STM to R hip/low back pain 5 min.     PT Long Term Goals - 11/08/17 0850      PT LONG TERM GOAL #1   Title  Pt. I  with HEP to increase B hip flexion strength 1/2 muscle grade to improve walking/ functional mobility.     Baseline  B hip flexion strength grossly 4+/5 MMT.  Pt. independent with [employer] ex. program.      Time  4    Period  Weeks    Status  Achieved    Target Date  11/08/17      PT LONG TERM GOAL #2   Title  Pt. will increase FOTO to 68 to improve pain-free mobility.     Baseline  FOTO baseline on 8/28 is 52.   9/23: 75 (marked improvement above goal)    Time  4    Period  Weeks    Status  Achieved    Target Date  11/08/17      PT LONG TERM GOAL #3   Title  Pt. able to complete gym based ex. program at [employer] with no c/o R low back pain to promote return to PLOF.      Baseline  no pain over past week    Time  4    Period  Weeks    Status  Achieved    Target Date    11/08/17         Plan - 11/08/17 0807    Clinical Impression Statement  Pt. moving around clinic with no issues and no c/o pain.  Pt. has shown marked improvement in FOTO and is able to ambulate/ move around PT clinic with no c/o R hip/back pain.  Pt. very motivated in losing wt. and working out at gym.  Goals met.  Pt. instructed to contact PT over next several weeks if any issues/ regression in pain symptoms.      Clinical Presentation  Stable    Clinical Decision Making  Low    Rehab Potential  Good    PT Frequency  2x / week    PT Duration  4 weeks    PT Treatment/Interventions  ADLs/Self Care Home Management;Aquatic Therapy;Cryotherapy;Electrical Stimulation;Moist Heat;Stair training;Functional mobility training;Gait training;Neuromuscular re-education;Balance training;Therapeutic exercise;Therapeutic activities;Patient/family education;Manual techniques;Dry needling;Passive range of motion    PT Next Visit Plan  Discharge visit       Patient will benefit from skilled therapeutic intervention in order to improve the following deficits and impairments:  Abnormal gait, Decreased endurance, Decreased  mobility, Hypomobility, Obesity, Decreased activity tolerance, Decreased strength, Impaired flexibility, Postural dysfunction, Pain  Visit Diagnosis: Chronic right-sided low back pain with right-sided sciatica  Difficulty in walking, not elsewhere classified  Muscle weakness (generalized)  Hip joint stiffness, right     Problem List There are no active problems to display for this patient.  Michael C Sherk, PT, DPT # [id number] 11/08/2017, 9:00 AM  [place] [facility] [address] [place], [place], [address] Phone: [phone]   Fax:  [phone]  Name: Evan Gonzalez MRN: [id number] Date of Birth: 02/12/1941   

## 2018-03-09 ENCOUNTER — Encounter (INDEPENDENT_AMBULATORY_CARE_PROVIDER_SITE_OTHER): Payer: Medicare Other | Admitting: Ophthalmology

## 2018-03-09 DIAGNOSIS — E113293 Type 2 diabetes mellitus with mild nonproliferative diabetic retinopathy without macular edema, bilateral: Secondary | ICD-10-CM

## 2018-03-09 DIAGNOSIS — H35033 Hypertensive retinopathy, bilateral: Secondary | ICD-10-CM

## 2018-03-09 DIAGNOSIS — E11319 Type 2 diabetes mellitus with unspecified diabetic retinopathy without macular edema: Secondary | ICD-10-CM

## 2018-03-09 DIAGNOSIS — I10 Essential (primary) hypertension: Secondary | ICD-10-CM

## 2018-03-09 DIAGNOSIS — H348112 Central retinal vein occlusion, right eye, stable: Secondary | ICD-10-CM

## 2018-03-09 DIAGNOSIS — H43813 Vitreous degeneration, bilateral: Secondary | ICD-10-CM

## 2018-03-23 DIAGNOSIS — R42 Dizziness and giddiness: Secondary | ICD-10-CM | POA: Insufficient documentation

## 2019-01-30 DIAGNOSIS — R109 Unspecified abdominal pain: Secondary | ICD-10-CM | POA: Insufficient documentation

## 2019-01-30 DIAGNOSIS — R10A1 Flank pain, right side: Secondary | ICD-10-CM | POA: Insufficient documentation

## 2019-02-06 ENCOUNTER — Ambulatory Visit: Payer: Medicare Other | Attending: Geriatric Medicine | Admitting: Physical Therapy

## 2019-02-06 ENCOUNTER — Encounter: Payer: Self-pay | Admitting: Physical Therapy

## 2019-02-06 ENCOUNTER — Other Ambulatory Visit: Payer: Self-pay

## 2019-02-06 DIAGNOSIS — G8929 Other chronic pain: Secondary | ICD-10-CM | POA: Insufficient documentation

## 2019-02-06 DIAGNOSIS — R262 Difficulty in walking, not elsewhere classified: Secondary | ICD-10-CM | POA: Diagnosis present

## 2019-02-06 DIAGNOSIS — M25651 Stiffness of right hip, not elsewhere classified: Secondary | ICD-10-CM | POA: Diagnosis present

## 2019-02-06 DIAGNOSIS — M5441 Lumbago with sciatica, right side: Secondary | ICD-10-CM | POA: Insufficient documentation

## 2019-02-06 DIAGNOSIS — M6281 Muscle weakness (generalized): Secondary | ICD-10-CM | POA: Insufficient documentation

## 2019-02-06 NOTE — Therapy (Signed)
[facility] [address] [place], [place], [address] Phone: [phone]   Fax:  [phone]  Physical Therapy Evaluation  Patient Details  Name: Evan Gonzalez MRN: [id number] Date of Birth: May 29, 1940 Referring Provider (PT): Dr. Joella Prince   Encounter Date: 02/06/2019  PT End of Session - 02/06/19 1046    Visit Number  1    Number of Visits  8    Date for PT Re-Evaluation  03/06/19    Authorization - Visit Number  1    Authorization - Number of Visits  10    PT Start Time  0902    PT Stop Time  1000    PT Time Calculation (min)  58 min    Activity Tolerance  Patient tolerated treatment well;Patient limited by pain    Behavior During Therapy  [facility] for tasks assessed/performed       Past Medical History:  Diagnosis Date  . Diabetes mellitus without complication ([place])   . Glaucoma   . Hyperlipidemia   . Hypertension     Past Surgical History:  Procedure Laterality Date  . EYE SURGERY      There were no vitals filed for this visit.   Subjective Assessment - 02/06/19 0903    Subjective  Pt. reports significant R sided low back pain over past couple weeks.  Pt. has chronic h/o R sided LBP, esp. while getting out of bed in morning.  Pt. states he likes to sleep on R side but unable to secondary to pain.  Pt. states he hasn't been able to attend the gym due to Covid virus.    Pertinent History  Pt. received PT in past for shoulder issues with good results.  Pt. very motivated to get better/ pain-free.      Limitations  Sitting;Lifting;Standing;Walking    Diagnostic tests  Recent X-ray (see [facility])    Patient Stated Goals  Decrease back pain/ return to gym with no limitations.      Currently in Pain?  Yes    Pain Score  5     Pain Location  Back    Pain Orientation  Right    Pain Descriptors / Indicators  Aching         Unable to go to gym due to Covid.  Pts. MD is retiring at end of month.     LLD: R 96 cm/ L 95 cm.    SLR: R 62 deg./ 62 deg.   Objective measurements completed on examination: See above findings.    There.ex.:  Nustep L4 10 min. B UE/LE  Supine trunk rotn/ SLR 10x each. Supine bridging (partial) 10x - limited by R sided discomfort  Reviewed standing there.ex. at home.       PT Long Term Goals - 02/06/19 1123      PT LONG TERM GOAL #1   Title  Pt. I with HEP to increase B hip flexion strength 1/2 muscle grade to improve walking/ functional mobility.     Baseline  B hip flexion strength grossly 4+/5 MMT.  Pt. currently not participating with ex. program secondary to not going to gym due to Covid    Time  4    Period  Weeks    Status  New    Target Date  03/06/19      PT LONG TERM GOAL #2   Title  Pt. will increase FOTO to 73 to improve pain-free mobility.    Baseline  Initial  FOTO: 60    Time  4    Period  Weeks    Status  New    Target Date  03/06/19      PT LONG TERM GOAL #3   Title  Pt. able to roll in bed/ get out of bed with no c/o R low back pain to promote return to PLOF.    Baseline  10/10 R sided low back pain reported while getting out of bed    Time  4    Period  Weeks    Status  New    Target Date  03/06/19             Plan - 02/06/19 1048    Clinical Impression Statement  Pt. is a pleasant 78 y/o male with chronic right-sided low back pain with right-sided sciatica. Pt. c/o 5/10 R low back pain at rest and 10/10 at worst (getting out of bed last week). Lumbar AROM: flexion 25% limited/ extension 50% limited/ increase R hip/side discomfort with lumbar rotn/ lateral flexion. B LE AROM WFL and strength grossly 5/5 MMT except hip flexion 4+/5 MMT. (-) SLR.  B hamstring flexibility 62 deg.  Moderate B hip hypomobility noted, esp. with adduction/ IR.  Minimal tenderness to R lumbar paraspinals/ R hip (generalized).  Slight LLD noted with R>L by 1 cm. and increase R hip height noted with standing/ gait.  R antalgic gait pattern with initial steps after  standing. FOTO: initial 60/ goal 73.  Pt. will benefit from skilled PT services to increase lumbar/LE flexibility and core stability to improve pain-free mobility.    Stability/Clinical Decision Making  Stable/Uncomplicated    Clinical Decision Making  Low    Rehab Potential  Good    PT Frequency  2x / week    PT Duration  4 weeks    PT Treatment/Interventions  ADLs/Self Care Home Management;Aquatic Therapy;Cryotherapy;Electrical Stimulation;Moist Heat;Stair training;Functional mobility training;Gait training;Neuromuscular re-education;Balance training;Therapeutic exercise;Therapeutic activities;Patient/family education;Manual techniques;Dry needling;Passive range of motion    PT Next Visit Plan  Issue HEP next tx. session/  Assess prone position       Patient will benefit from skilled therapeutic intervention in order to improve the following deficits and impairments:  Abnormal gait, Decreased endurance, Decreased mobility, Hypomobility, Obesity, Decreased activity tolerance, Decreased strength, Impaired flexibility, Postural dysfunction, Pain  Visit Diagnosis: Chronic right-sided low back pain with right-sided sciatica  Difficulty in walking, not elsewhere classified  Muscle weakness (generalized)  Hip joint stiffness, right     Problem List There are no problems to display for this patient.  Pura Spice, PT, DPT # [phone] 02/06/2019, 11:27 AM  [facility] [address] [place], [place], [address] Phone: [phone]   Fax:  [phone]  Name: Evan Gonzalez MRN: [id number] Date of Birth: 1941-01-18

## 2019-02-08 ENCOUNTER — Ambulatory Visit: Payer: Medicare Other | Admitting: Physical Therapy

## 2019-02-08 ENCOUNTER — Encounter: Payer: Self-pay | Admitting: Physical Therapy

## 2019-02-08 ENCOUNTER — Other Ambulatory Visit: Payer: Self-pay

## 2019-02-08 DIAGNOSIS — M6281 Muscle weakness (generalized): Secondary | ICD-10-CM

## 2019-02-08 DIAGNOSIS — M5441 Lumbago with sciatica, right side: Secondary | ICD-10-CM

## 2019-02-08 DIAGNOSIS — M25651 Stiffness of right hip, not elsewhere classified: Secondary | ICD-10-CM

## 2019-02-08 DIAGNOSIS — R262 Difficulty in walking, not elsewhere classified: Secondary | ICD-10-CM

## 2019-02-08 DIAGNOSIS — G8929 Other chronic pain: Secondary | ICD-10-CM

## 2019-02-08 NOTE — Patient Instructions (Signed)
Access Code: [email or web address]  URL: [email or web address]  Date: 02/08/2019  Prepared by: Dorcas Carrow   Exercises  Standing Hamstring Stretch with Step - 3 reps - 1 sets - 20 seconds hold - 1x daily - 7x weekly  Supine Piriformis Stretch with Leg Straight - 3 reps - 1 sets - 20 seconds hold - 1x daily - 7x weekly  Hooklying Transversus Abdominis Palpation - 10 reps - 1 sets - 1x daily - 7x weekly  Supine Transversus Abdominis Bracing with Double Leg Fallout - 10 reps - 1 sets - 1x daily - 7x weekly  Supine March - 10 reps - 1 sets - 1x daily - 7x weekly  Supine Bridge - 10 reps - 1 sets - 1x daily - 7x weekly  Supine Lower Trunk Rotation - 10 reps - 1 sets - 1x daily - 7x weekly

## 2019-02-08 NOTE — Therapy (Signed)
[facility] [address] [place], [place], [address] Phone: [phone]   Fax:  [phone]  Physical Therapy Treatment  Patient Details  Name: Evan Gonzalez MRN: [id number] Date of Birth: [birth year] Referring Provider (PT): Dr. Joella Prince   Encounter Date: 02/08/2019  PT End of Session - 02/08/19 0928    Visit Number  2    Number of Visits  8    Date for PT Re-Evaluation  03/06/19    Authorization - Visit Number  2    Authorization - Number of Visits  10    PT Start Time  0844    PT Stop Time  0928    PT Time Calculation (min)  44 min    Activity Tolerance  Patient tolerated treatment well;Patient limited by pain    Behavior During Therapy  [facility] for tasks assessed/performed       Past Medical History:  Diagnosis Date  . Diabetes mellitus without complication ([place])   . Glaucoma   . Hyperlipidemia   . Hypertension     Past Surgical History:  Procedure Laterality Date  . EYE SURGERY      There were no vitals filed for this visit.  Subjective Assessment - 02/08/19 0857    Subjective  Pt. states R side of back/ hip are doing some better since initial PT evaluation.  Pt. reports a little soreness in LE after Nustep the other day but "felt good".  Pt. misses being able to work out at the gym.    Pertinent History  Pt. received PT in past for shoulder issues with good results.  Pt. very motivated to get better/ pain-free.      Limitations  Sitting;Lifting;Standing;Walking    Diagnostic tests  Recent X-ray (see [facility])    Patient Stated Goals  Decrease back pain/ return to gym with no limitations.      Currently in Pain?  Yes    Pain Score  8     Pain Location  Back    Pain Orientation  Right    Pain Descriptors / Indicators  Aching        Manual tx.:  Supine LE/lumbar stretches (focus on proximal hamstring/ piriformis/ trunk rotn.)- 10 min. L sidelying R flank/hip STM (as tolerated)- minimal c/o tenderness.    There.ex.:  See HEP (handouts provided) Nustep L5 10 min. B UE/LE   PT Education - 02/08/19 1696    Education Details  See HEP    Person(s) Educated  Patient    Methods  Explanation;Demonstration;Handout    Comprehension  Verbalized understanding;Returned demonstration          PT Long Term Goals - 02/06/19 1123      PT LONG TERM GOAL #1   Title  Pt. I with HEP to increase B hip flexion strength 1/2 muscle grade to improve walking/ functional mobility.     Baseline  B hip flexion strength grossly 4+/5 MMT.  Pt. currently not participating with ex. program secondary to not going to gym due to Covid    Time  4    Period  Weeks    Status  New    Target Date  03/06/19      PT LONG TERM GOAL #2   Title  Pt. will increase FOTO to 73 to improve pain-free mobility.    Baseline  Initial FOTO: 60    Time  4    Period  Weeks    Status  New  Target Date  03/06/19      PT LONG TERM GOAL #3   Title  Pt. able to roll in bed/ get out of bed with no c/o R low back pain to promote return to PLOF.    Baseline  10/10 R sided low back pain reported while getting out of bed    Time  4    Period  Weeks    Status  New    Target Date  03/06/19           Plan - 02/08/19 7425    Clinical Impression Statement  R sided flank discomfort while lying completely supine with knees extended.  Pts. pain improves quickly with knees in flexed posture.  R flank during L LE rotn. on mat table as compared to R LE rotn.  Pt. demonstrates good technique with stretching/ core stability ex.    Stability/Clinical Decision Making  Stable/Uncomplicated    Clinical Decision Making  Low    Rehab Potential  Good    PT Frequency  2x / week    PT Duration  4 weeks    PT Treatment/Interventions  ADLs/Self Care Home Management;Aquatic Therapy;Cryotherapy;Electrical Stimulation;Moist Heat;Stair training;Functional mobility training;Gait training;Neuromuscular re-education;Balance training;Therapeutic  exercise;Therapeutic activities;Patient/family education;Manual techniques;Dry needling;Passive range of motion    PT Next Visit Plan  Assess prone position/ discuss HEP       Patient will benefit from skilled therapeutic intervention in order to improve the following deficits and impairments:  Abnormal gait, Decreased endurance, Decreased mobility, Hypomobility, Obesity, Decreased activity tolerance, Decreased strength, Impaired flexibility, Postural dysfunction, Pain  Visit Diagnosis: Chronic right-sided low back pain with right-sided sciatica  Difficulty in walking, not elsewhere classified  Muscle weakness (generalized)  Hip joint stiffness, right     Problem List There are no problems to display for this patient.  Pura Spice, PT, DPT # [phone] 02/08/2019, 9:31 AM  [facility] [address] [place], [place], [address] Phone: [phone]   Fax:  [phone]  Name: STANFORD STRAUCH MRN: [id number] Date of Birth: [birth year]

## 2019-02-13 ENCOUNTER — Ambulatory Visit: Payer: Medicare Other | Admitting: Physical Therapy

## 2019-02-15 ENCOUNTER — Ambulatory Visit: Payer: Medicare Other | Admitting: Physical Therapy

## 2019-02-15 ENCOUNTER — Ambulatory Visit
Admission: EM | Admit: 2019-02-15 | Discharge: 2019-02-15 | Disposition: A | Discharge status: Home / Self Care | Payer: Medicare Other | Attending: Family Medicine | Admitting: Family Medicine

## 2019-02-15 ENCOUNTER — Encounter: Payer: Self-pay | Admitting: Emergency Medicine

## 2019-02-15 ENCOUNTER — Ambulatory Visit (INDEPENDENT_AMBULATORY_CARE_PROVIDER_SITE_OTHER)
Admit: 2019-02-15 | Discharge: 2019-02-15 | Disposition: A | Discharge status: Home / Self Care | Payer: Medicare Other | Attending: Family Medicine | Admitting: Family Medicine

## 2019-02-15 ENCOUNTER — Other Ambulatory Visit: Payer: Self-pay

## 2019-02-15 DIAGNOSIS — R109 Unspecified abdominal pain: Secondary | ICD-10-CM

## 2019-02-15 DIAGNOSIS — R590 Localized enlarged lymph nodes: Secondary | ICD-10-CM | POA: Insufficient documentation

## 2019-02-15 DIAGNOSIS — M899 Disorder of bone, unspecified: Secondary | ICD-10-CM | POA: Insufficient documentation

## 2019-02-15 DIAGNOSIS — R935 Abnormal findings on diagnostic imaging of other abdominal regions, including retroperitoneum: Secondary | ICD-10-CM | POA: Insufficient documentation

## 2019-02-15 LAB — URINALYSIS, COMPLETE (UACMP) WITH MICROSCOPIC
Bacteria, UA: NONE SEEN
Bilirubin Urine: NEGATIVE
Glucose, UA: NEGATIVE mg/dL
Hgb urine dipstick: NEGATIVE
Ketones, ur: NEGATIVE mg/dL
Leukocytes,Ua: NEGATIVE
Nitrite: NEGATIVE
Protein, ur: NEGATIVE mg/dL
Specific Gravity, Urine: 1.025 (ref 1.005–1.030)
WBC, UA: NONE SEEN WBC/hpf (ref 0–5)
pH: 5.5 (ref 5.0–8.0)

## 2019-02-15 LAB — CBC WITH DIFFERENTIAL/PLATELET
Abs Immature Granulocytes: 0.01 10*3/uL (ref 0.00–0.07)
Basophils Absolute: 0 10*3/uL (ref 0.0–0.1)
Basophils Relative: 1 %
Eosinophils Absolute: 0.1 10*3/uL (ref 0.0–0.5)
Eosinophils Relative: 2 %
HCT: 42.6 % (ref 39.0–52.0)
Hemoglobin: 14.1 g/dL (ref 13.0–17.0)
Immature Granulocytes: 0 %
Lymphocytes Relative: 40 %
Lymphs Abs: 2.6 10*3/uL (ref 0.7–4.0)
MCH: 31.2 pg (ref 26.0–34.0)
MCHC: 33.1 g/dL (ref 30.0–36.0)
MCV: 94.2 fL (ref 80.0–100.0)
Monocytes Absolute: 0.6 10*3/uL (ref 0.1–1.0)
Monocytes Relative: 9 %
Neutro Abs: 3.2 10*3/uL (ref 1.7–7.7)
Neutrophils Relative %: 48 %
Platelets: 204 10*3/uL (ref 150–400)
RBC: 4.52 MIL/uL (ref 4.22–5.81)
RDW: 13.4 % (ref 11.5–15.5)
WBC: 6.5 10*3/uL (ref 4.0–10.5)
nRBC: 0 % (ref 0.0–0.2)

## 2019-02-15 LAB — COMPREHENSIVE METABOLIC PANEL
ALT: 22 U/L (ref 0–44)
AST: 26 U/L (ref 15–41)
Albumin: 4.1 g/dL (ref 3.5–5.0)
Alkaline Phosphatase: 39 U/L (ref 38–126)
Anion gap: 5 (ref 5–15)
BUN: 10 mg/dL (ref 8–23)
CO2: 24 mmol/L (ref 22–32)
Calcium: 8.7 mg/dL — ABNORMAL LOW (ref 8.9–10.3)
Chloride: 104 mmol/L (ref 98–111)
Creatinine, Ser: 0.95 mg/dL (ref 0.61–1.24)
GFR calc Af Amer: >60 mL/min (ref >60)
GFR calc non Af Amer: >60 mL/min (ref >60)
Glucose, Bld: 93 mg/dL (ref 70–99)
Potassium: 4.6 mmol/L (ref 3.5–5.1)
Sodium: 133 mmol/L — ABNORMAL LOW (ref 135–145)
Total Bilirubin: 0.9 mg/dL (ref 0.3–1.2)
Total Protein: 7.8 g/dL (ref 6.5–8.1)

## 2019-02-15 MED ORDER — KETOROLAC TROMETHAMINE 60 MG/2ML IM SOLN
30.0000 mg | Freq: Once | INTRAMUSCULAR | Status: AC
  Administered 2019-02-15: 12:00:00 30 mg via INTRAMUSCULAR

## 2019-02-15 MED ORDER — KETOROLAC TROMETHAMINE 30 MG/ML IJ SOLN: 30.0000 mg | Freq: Once | INTRAMUSCULAR | Status: DC

## 2019-02-15 MED ORDER — IOHEXOL 300 MG/ML  SOLN
100.0000 mL | Freq: Once | INTRAMUSCULAR | Status: AC | PRN
  Administered 2019-02-15: 100 mL via INTRAVENOUS

## 2019-02-15 MED ORDER — HYDROCODONE-ACETAMINOPHEN 5-325 MG PO TABS: ORAL_TABLET | ORAL | 0 refills | Status: DC

## 2019-02-15 NOTE — ED Triage Notes (Signed)
Pt c/o right sided flank pain. Started about 10 days ago after having PT. He had hematuria that same day but has not had any since then.  Later during triage pt states that he has seen his PCP for this and has had xrays done and this was the reason he was at PT

## 2019-02-15 NOTE — ED Provider Notes (Signed)
[facility]    CSN: [id number] Arrival date & time: 02/15/19  0847      History   Chief Complaint Chief Complaint  Patient presents with  . Flank Pain    HPI Evan Gonzalez is a 78 y.o. male.   78 yo male with a c/o right sided flank pain, back pain and right lower abdominal pain for approximately the past 2 weeks. States about 10 days ago he had one day that he noticed blood in the urine then resolved spontaneously. Denies any falls or injuries, dysuria, diarrhea, constipation, cough, fevers, chills. States he saw his PCP for the last time 2 weeks ago, had a lumbar spine x-ray done, and referred for physical therapy. States his PCP was leaving, so he's looking for a new PCP.      Past Medical History:  Diagnosis Date  . Diabetes mellitus without complication ([place])   . Glaucoma   . Hyperlipidemia   . Hypertension     There are no problems to display for this patient.   Past Surgical History:  Procedure Laterality Date  . EYE SURGERY         Home Medications    Prior to Admission medications   Medication Sig Start Date End Date Taking? Authorizing Provider  acetaminophen (TYLENOL) 650 MG CR tablet Take 650 mg by mouth 2 (two) times daily.   Yes [provider]  dipyridamole-aspirin (AGGRENOX) 200-25 MG 12hr capsule Take 1 capsule by mouth 2 (two) times daily.   Yes [provider]  dorzolamide (TRUSOPT) 2 % ophthalmic solution Place 1 drop into both eyes 2 (two) times daily.   Yes [provider]  fluticasone (FLONASE) 50 MCG/ACT nasal spray Place 2 sprays into both nostrils daily. 02/07/17  Yes Melynda Ripple, MD  glipiZIDE (GLUCOTROL) 5 MG tablet Take 5 mg by mouth daily before breakfast.   Yes [provider]  latanoprost (XALATAN) 0.005 % ophthalmic solution Place 1 drop into both eyes at bedtime.   Yes [provider]  lisinopril (PRINIVIL,ZESTRIL) 5 MG tablet Take 5 mg by mouth daily as needed  (hypertension).    Yes [provider]  meclizine (ANTIVERT) 25 MG tablet Take 1 tablet (25 mg total) by mouth 3 (three) times daily as needed for dizziness. 09/24/17  Yes Harvest Dark, MD  metFORMIN (GLUCOPHAGE) 500 MG tablet Take 500 mg by mouth 2 (two) times daily with a meal.   Yes [provider]  simvastatin (ZOCOR) 20 MG tablet Take 20 mg by mouth at bedtime.   Yes [provider]  benzonatate (TESSALON) 200 MG capsule Take 1 capsule (200 mg total) by mouth 3 (three) times daily as needed for cough. Patient not taking: Reported on 09/24/2017 02/07/17   Melynda Ripple, MD  HYDROcodone-acetaminophen (NORCO/VICODIN) 5-325 MG tablet 1-2 tabs po q 6 hours prn 02/15/19   Norval Gable, MD    Family History Family History  Problem Relation Age of Onset  . Heart attack Mother   . Diabetes Father     Social History Social History   Tobacco Use  . Smoking status: Former Smoker    Quit date: 02/08/1987    Years since quitting: 32.0  . Smokeless tobacco: Never Used  Substance Use Topics  . Alcohol use: No  . Drug use: No     Allergies   Patient has no known allergies.   Review of Systems Review of Systems   Physical Exam Triage Vital Signs ED Triage Vitals  Enc Vitals Group     BP 02/15/19 0926 (!) 149/83     Pulse Rate 02/15/19 0926 75     Resp 02/15/19 0926 18     Temp 02/15/19 0926 98 F (36.7 C)     Temp Source 02/15/19 0926 Oral     SpO2 02/15/19 0926 99 %     Weight 02/15/19 0920 260 lb (117.9 kg)     Height 02/15/19 0920 5\' 11"  (1.803 m)     Head Circumference --      Peak Flow --      Pain Score 02/15/19 0919 10     Pain Loc --      Pain Edu? --      Excl. in [place]? --    No data found.  Updated Vital Signs BP (!) 149/83 (BP Location: Left Arm)   Pulse 75   Temp 98 F (36.7 C) (Oral)   Resp 18   Ht 5\' 11"  (1.803 m)   Wt 117.9 kg   SpO2 99%   BMI 36.26 kg/m   Visual Acuity Right Eye Distance:   Left Eye  Distance:   Bilateral Distance:    Right Eye Near:   Left Eye Near:    Bilateral Near:     Physical Exam Vitals and nursing note reviewed.  Constitutional:      General: He is not in acute distress.    Appearance: He is not toxic-appearing or diaphoretic.  Cardiovascular:     Rate and Rhythm: Regular rhythm.  Pulmonary:     Effort: Pulmonary effort is normal.     Breath sounds: Normal breath sounds.  Abdominal:     General: Bowel sounds are normal. There is no distension.     Palpations: Abdomen is soft. There is no mass.     Tenderness: There is abdominal tenderness (right lower quadrant). There is right CVA tenderness. There is no left CVA tenderness, guarding or rebound.     Hernia: No hernia is present.  Neurological:     Mental Status: He is alert.      UC Treatments / Results  Labs (all labs ordered are listed, but only abnormal results are displayed) Labs Reviewed  COMPREHENSIVE METABOLIC PANEL - Abnormal; Notable for the following components:      Result Value   Sodium 133 (*)    Calcium 8.7 (*)    All other components within normal limits  URINALYSIS, COMPLETE (UACMP) WITH MICROSCOPIC  CBC WITH DIFFERENTIAL/PLATELET    EKG   Radiology No results found.  Procedures Procedures (including critical care time)  Medications Ordered in UC Medications  ketorolac (TORADOL) injection 30 mg (30 mg Intramuscular Given 02/15/19 1206)    Initial Impression / Assessment and Plan / UC Course  I have reviewed the triage vital signs and the nursing notes.  Pertinent labs & imaging results that were available during my care of the patient were reviewed by me and considered in my medical decision making (see chart for details).      Final Clinical Impressions(s) / UC Diagnoses   Final diagnoses:  Abdominal lymphadenopathy  Lesion of thoracic vertebra  Abnormal CT of the abdomen     Discharge Instructions     Follow up with oncologist (cancer  specialist)    ED Prescriptions    Medication Sig Dispense Auth. Provider   HYDROcodone-acetaminophen (NORCO/VICODIN) 5-325 MG tablet 1-2 tabs po q 6 hours prn 15 tablet Norval Gable, MD      1.  Abdominal/pelvic CT scan results and diagnosis reviewed with patient 2. rx as per orders above; reviewed possible side effects, interactions, risks and benefits  3. Recommend patient establish care with a new PCP 4. Recommend follow up with Oncology for further evaluation and management; patient will be seen next week at [facility]     I have reviewed the PDMP during this encounter.   Norval Gable, MD 02/15/19 1640

## 2019-02-15 NOTE — Discharge Instructions (Signed)
Follow up with oncologist (cancer specialist)

## 2019-02-15 NOTE — ED Notes (Signed)
Called [facility] [employer] for prior authorization for CPT [phone]. Patient does not require prior authorization per [facility].

## 2019-02-20 ENCOUNTER — Ambulatory Visit: Payer: Medicare Other | Admitting: Physical Therapy

## 2019-02-21 ENCOUNTER — Other Ambulatory Visit: Payer: Self-pay

## 2019-02-21 ENCOUNTER — Encounter: Payer: Self-pay | Admitting: Oncology

## 2019-02-21 ENCOUNTER — Inpatient Hospital Stay: Payer: Medicare Other

## 2019-02-21 ENCOUNTER — Inpatient Hospital Stay: Payer: Medicare Other | Attending: Oncology | Admitting: Oncology

## 2019-02-21 VITALS — BP 141/90 | Temp 96.0°F | Ht 71.0 in | Wt 268.4 lb

## 2019-02-21 DIAGNOSIS — C61 Malignant neoplasm of prostate: Secondary | ICD-10-CM | POA: Diagnosis not present

## 2019-02-21 DIAGNOSIS — Z79899 Other long term (current) drug therapy: Secondary | ICD-10-CM | POA: Insufficient documentation

## 2019-02-21 DIAGNOSIS — E119 Type 2 diabetes mellitus without complications: Secondary | ICD-10-CM | POA: Insufficient documentation

## 2019-02-21 DIAGNOSIS — I700 Atherosclerosis of aorta: Secondary | ICD-10-CM | POA: Diagnosis not present

## 2019-02-21 DIAGNOSIS — I2510 Atherosclerotic heart disease of native coronary artery without angina pectoris: Secondary | ICD-10-CM | POA: Diagnosis not present

## 2019-02-21 DIAGNOSIS — Z7984 Long term (current) use of oral hypoglycemic drugs: Secondary | ICD-10-CM | POA: Insufficient documentation

## 2019-02-21 DIAGNOSIS — Z87891 Personal history of nicotine dependence: Secondary | ICD-10-CM | POA: Diagnosis not present

## 2019-02-21 DIAGNOSIS — E785 Hyperlipidemia, unspecified: Secondary | ICD-10-CM | POA: Diagnosis not present

## 2019-02-21 DIAGNOSIS — I10 Essential (primary) hypertension: Secondary | ICD-10-CM | POA: Insufficient documentation

## 2019-02-21 DIAGNOSIS — M899 Disorder of bone, unspecified: Secondary | ICD-10-CM | POA: Insufficient documentation

## 2019-02-21 DIAGNOSIS — R591 Generalized enlarged lymph nodes: Secondary | ICD-10-CM | POA: Insufficient documentation

## 2019-02-21 DIAGNOSIS — C7951 Secondary malignant neoplasm of bone: Secondary | ICD-10-CM | POA: Diagnosis not present

## 2019-02-21 DIAGNOSIS — K760 Fatty (change of) liver, not elsewhere classified: Secondary | ICD-10-CM | POA: Diagnosis not present

## 2019-02-21 DIAGNOSIS — R972 Elevated prostate specific antigen [PSA]: Secondary | ICD-10-CM | POA: Diagnosis not present

## 2019-02-21 LAB — PSA: Prostatic Specific Antigen: 228 ng/mL — ABNORMAL HIGH (ref 0.00–4.00)

## 2019-02-21 MED ORDER — HYDROCODONE-ACETAMINOPHEN 5-325 MG PO TABS: 1.0000 | ORAL_TABLET | Freq: Four times a day (QID) | ORAL | 0 refills | Status: DC | PRN

## 2019-02-21 NOTE — Progress Notes (Signed)
[place]  [phone] [phone]  ID: [name] OB: 07/16/1940  MR#: [id number]  PPI#:[id number]  Patient Care Team: Harlow Ohms, MD as PCP - General (Geriatric Medicine)  CHIEF COMPLAINT: Lymphadenopathy, T12 bony lesion  INTERVAL HISTORY: Patient is a 79 year old male who recently presented to the emergency room with increasing right flank pain.  Subsequent imaging revealed a destructive T12 lesion as well as a large external iliac lymph node.  He continues to have pain, but otherwise feels well.  He denies any other pain.  He has no neurologic complaints.  He denies any recent fevers or illnesses.  He has a good appetite and denies weight loss.  He has no chest pain, shortness of breath, cough, or hemoptysis.  He denies any nausea, vomiting, constipation, or diarrhea.  He has no urinary complaints.  Patient otherwise feels well and offers no further specific complaints today.  REVIEW OF SYSTEMS:   Review of Systems  Constitutional: Negative.  Negative for fever and malaise/fatigue.  Respiratory: Negative.  Negative for cough and shortness of breath.   Cardiovascular: Negative.  Negative for chest pain and leg swelling.  Gastrointestinal: Negative.  Negative for abdominal pain.  Genitourinary: Positive for flank pain.  Musculoskeletal: Negative for back pain.  Skin: Negative.  Negative for rash.  Neurological: Negative.  Negative for dizziness, focal weakness, weakness and headaches.  Psychiatric/Behavioral: Negative.  The patient is not nervous/anxious.     As per HPI. Otherwise, a complete review of systems is negative.  PAST MEDICAL HISTORY: Past Medical History:  Diagnosis Date  . Diabetes mellitus without complication ([place])   . Glaucoma   . Hyperlipidemia   . Hypertension     PAST SURGICAL HISTORY: Past Surgical History:  Procedure Laterality Date  . EYE SURGERY      FAMILY HISTORY: Family History  Problem  Relation Age of Onset  . Heart attack Mother   . Diabetes Father     ADVANCED DIRECTIVES (Y/N):  N  HEALTH MAINTENANCE: Social History   Tobacco Use  . Smoking status: Former Smoker    Quit date: 02/08/1987    Years since quitting: 32.0  . Smokeless tobacco: Never Used  Substance Use Topics  . Alcohol use: No  . Drug use: No     Colonoscopy:  PAP:  Bone density:  Lipid panel:  No Known Allergies  Current Outpatient Medications  Medication Sig Dispense Refill  . acetaminophen (TYLENOL) 650 MG CR tablet Take 650 mg by mouth 2 (two) times daily.    . Brinzolamide-Brimonidine (SIMBRINZA) 1-0.2 % SUSP Simbrinza 1 %-0.2 % eye drops,suspension    . dipyridamole-aspirin (AGGRENOX) 200-25 MG 12hr capsule Take 1 capsule by mouth 2 (two) times daily.    . dorzolamide (TRUSOPT) 2 % ophthalmic solution Place 1 drop into both eyes 2 (two) times daily.    . fluticasone (FLONASE) 50 MCG/ACT nasal spray Place 2 sprays into both nostrils daily. 16 g 0  . glipiZIDE (GLUCOTROL) 5 MG tablet Take 5 mg by mouth daily before breakfast.    . latanoprost (XALATAN) 0.005 % ophthalmic solution Place 1 drop into both eyes at bedtime.    Marland Kitchen lisinopril (PRINIVIL,ZESTRIL) 5 MG tablet Take 5 mg by mouth daily as needed (hypertension).     . metFORMIN (GLUCOPHAGE) 500 MG tablet Take 500 mg by mouth 2 (two) times daily with a meal.    . simvastatin (ZOCOR) 20 MG tablet Take 20 mg by mouth at bedtime.    Marland Kitchen  benzonatate (TESSALON) 200 MG capsule Take 1 capsule (200 mg total) by mouth 3 (three) times daily as needed for cough. (Patient not taking: Reported on 02/21/2019) 30 capsule 0  . HYDROcodone-acetaminophen (NORCO) 5-325 MG tablet Take 1-2 tablets by mouth every 6 (six) hours as needed for moderate pain. 40 tablet 0  . meclizine (ANTIVERT) 25 MG tablet Take 1 tablet (25 mg total) by mouth 3 (three) times daily as needed for dizziness. (Patient not taking: Reported on 02/21/2019) 30 tablet 0   No current  facility-administered medications for this visit.    OBJECTIVE: Vitals:   02/21/19 1108  BP: (!) 141/90  Temp: (!) 96 F (35.6 C)     Body mass index is 37.43 kg/m.    ECOG FS:1 - Symptomatic but completely ambulatory  General: Well-developed, well-nourished, no acute distress. Eyes: Pink conjunctiva, anicteric sclera. HEENT: Normocephalic, moist mucous membranes. Lungs: No audible wheezing or coughing. Heart: Regular rate and rhythm. Abdomen: Soft, nontender, no obvious distention. Musculoskeletal: No edema, cyanosis, or clubbing. Neuro: Alert, answering all questions appropriately. Cranial nerves grossly intact. Skin: No rashes or petechiae noted. Psych: Normal affect. Lymphatics: No cervical, calvicular, axillary or inguinal LAD.   LAB RESULTS:  Lab Results  Component Value Date   NA 133 (L) 02/15/2019   K 4.6 02/15/2019   CL 104 02/15/2019   CO2 24 02/15/2019   GLUCOSE 93 02/15/2019   BUN 10 02/15/2019   CREATININE 0.95 02/15/2019   CALCIUM 8.7 (L) 02/15/2019   PROT 7.8 02/15/2019   ALBUMIN 4.1 02/15/2019   AST 26 02/15/2019   ALT 22 02/15/2019   ALKPHOS 39 02/15/2019   BILITOT 0.9 02/15/2019   GFRNONAA >60 02/15/2019   GFRAA >60 02/15/2019    Lab Results  Component Value Date   WBC 6.5 02/15/2019   NEUTROABS 3.2 02/15/2019   HGB 14.1 02/15/2019   HCT 42.6 02/15/2019   MCV 94.2 02/15/2019   PLT 204 02/15/2019     STUDIES: CT ABDOMEN PELVIS W CONTRAST  Result Date: 02/15/2019 CLINICAL DATA:  Right flank pain for approximately 10 days. The patient had hematuria at the onset of flank pain which has subsequently resolved. EXAM: CT ABDOMEN AND PELVIS WITH CONTRAST TECHNIQUE: Multidetector CT imaging of the abdomen and pelvis was performed using the standard protocol following bolus administration of intravenous contrast. CONTRAST:  100 mL OMNIPAQUE IOHEXOL 300 MG/ML  SOLN COMPARISON:  None. FINDINGS: Lower chest: No pleural or pericardial effusion. Lung  bases are clear. Hepatobiliary: The liver is diffusely low attenuating consistent with fatty infiltration. A cyst or hemangioma in the dome of the liver measuring 2.6 cm is noted. There is also a second small cyst inferior in the right hepatic lobe measuring 0.8 cm. No other focal liver lesion. Gallbladder and biliary tree appear normal. Pancreas: Unremarkable. No pancreatic ductal dilatation or surrounding inflammatory changes. Spleen: Normal in size without focal abnormality. Adrenals/Urinary Tract: The adrenal glands appear normal. There is a 0.5 cm nonobstructing stone in the lower pole the left kidney. No other urinary tract stones are identified. The urinary bladder is unremarkable. Stomach/Bowel: Stomach is within normal limits. Appendix appears normal. No evidence of bowel wall thickening, distention, or inflammatory changes. Diverticulosis without diverticulitis noted. Vascular/Lymphatic: There is a left external iliac lymph node measuring 3.9 x 4.1 cm on image 6 series 2. No other lymphadenopathy is identified. Atherosclerosis noted. Reproductive: The prostate gland is enlarged with an irregular, lobulated border throughout. Other: None. Musculoskeletal: There is a destructive lesion in  the T12 vertebral body with an associated mild pathologic compression fracture. No bony retropulsion or involvement of the posterior elements. No other focal bony lesion. IMPRESSION: Findings consistent with neoplastic process including a large left external iliac lymph node and a destructive lesion in T12. Prostate cancer is suspected but the primary lesion is not definitely identified. Punctate nonobstructing stone lower pole left kidney. Atherosclerosis. Fatty infiltration of the liver. Diverticulosis without diverticulitis. These results will be called to the ordering clinician or representative by the Radiologist Assistant, and communication documented in the PACS or zVision Dashboard. Electronically Signed   By:  Inge Rise M.D.   On: 02/15/2019 12:13    ASSESSMENT: Lymphadenopathy, T12 bony lesion  PLAN:    1. Lymphadenopathy, T12 bony lesion: Highly suspicious for underlying malignancy, possibly prostate cancer.  PSA, SPEP, CA 19-9, and CEA are pending at time of dictation.  We will also get a CT of the chest as well as a nuclear med bone scan to complete the staging work-up.  Ultimately, patient will likely require a biopsy to confirm the diagnosis.  Return to clinic in 1 to 2 weeks after his imaging is completed to discuss the results and treatment planning if necessary. 2.  Pain: Patient was given a refill of his Vicodin today.  Ultimately, he likely will require XRT to the T12 lesion.  I spent a total of 60 minutes face-to-face with the patient and reviewing chart data of which greater than 50% of the visit was spent in counseling and coordination of care as detailed above.   Patient expressed understanding and was in agreement with this plan. He also understands that He can call clinic at any time with any questions, concerns, or complaints.   Cancer Staging No matching staging information was found for the patient.  Lloyd Huger, MD   02/21/2019 12:22 PM

## 2019-02-21 NOTE — Progress Notes (Signed)
Pt reports having pain in right iliac area, severe pain, finished pain med on Sunday.  Rates pain as a 9 today and a 10 every morning.

## 2019-02-22 ENCOUNTER — Encounter: Payer: Medicaid Other | Admitting: Physical Therapy

## 2019-02-22 LAB — PROTEIN ELECTROPHORESIS, SERUM
A/G Ratio: 1.1 (ref 0.7–1.7)
Albumin ELP: 3.8 g/dL (ref 2.9–4.4)
Alpha-1-Globulin: 0.3 g/dL (ref 0.0–0.4)
Alpha-2-Globulin: 0.7 g/dL (ref 0.4–1.0)
Beta Globulin: 1.1 g/dL (ref 0.7–1.3)
Gamma Globulin: 1.3 g/dL (ref 0.4–1.8)
Globulin, Total: 3.4 g/dL (ref 2.2–3.9)
Total Protein ELP: 7.2 g/dL (ref 6.0–8.5)

## 2019-02-22 LAB — CANCER ANTIGEN 19-9: CA 19-9: 21 U/mL (ref 0–35)

## 2019-02-22 LAB — CEA: CEA: 3.3 ng/mL (ref 0.0–4.7)

## 2019-02-25 NOTE — Progress Notes (Deleted)
[place]  [phone] [phone]  ID: Evan Gonzalez OB: 12-03-1940  MR#: [id number]  GUR#:[id number]  Patient Care Team: Harlow Ohms, MD as PCP - General (Geriatric Medicine)  CHIEF COMPLAINT: Lymphadenopathy, T12 bony lesion  INTERVAL HISTORY: Patient is a 79 year old male who recently presented to the emergency room with increasing right flank pain.  Subsequent imaging revealed a destructive T12 lesion as well as a large external iliac lymph node.  He continues to have pain, but otherwise feels well.  He denies any other pain.  He has no neurologic complaints.  He denies any recent fevers or illnesses.  He has a good appetite and denies weight loss.  He has no chest pain, shortness of breath, cough, or hemoptysis.  He denies any nausea, vomiting, constipation, or diarrhea.  He has no urinary complaints.  Patient otherwise feels well and offers no further specific complaints today.  REVIEW OF SYSTEMS:   Review of Systems  Constitutional: Negative.  Negative for fever and malaise/fatigue.  Respiratory: Negative.  Negative for cough and shortness of breath.   Cardiovascular: Negative.  Negative for chest pain and leg swelling.  Gastrointestinal: Negative.  Negative for abdominal pain.  Genitourinary: Positive for flank pain.  Musculoskeletal: Negative for back pain.  Skin: Negative.  Negative for rash.  Neurological: Negative.  Negative for dizziness, focal weakness, weakness and headaches.  Psychiatric/Behavioral: Negative.  The patient is not nervous/anxious.     As per HPI. Otherwise, a complete review of systems is negative.  PAST MEDICAL HISTORY: Past Medical History:  Diagnosis Date  . Diabetes mellitus without complication ([place])   . Glaucoma   . Hyperlipidemia   . Hypertension     PAST SURGICAL HISTORY: Past Surgical History:  Procedure Laterality Date  . EYE SURGERY      FAMILY HISTORY: Family History  Problem  Relation Age of Onset  . Heart attack Mother   . Diabetes Father     ADVANCED DIRECTIVES (Y/N):  N  HEALTH MAINTENANCE: Social History   Tobacco Use  . Smoking status: Former Smoker    Quit date: 02/08/1987    Years since quitting: 32.0  . Smokeless tobacco: Never Used  Substance Use Topics  . Alcohol use: No  . Drug use: No     Colonoscopy:  PAP:  Bone density:  Lipid panel:  No Known Allergies  Current Outpatient Medications  Medication Sig Dispense Refill  . acetaminophen (TYLENOL) 650 MG CR tablet Take 650 mg by mouth 2 (two) times daily.    . benzonatate (TESSALON) 200 MG capsule Take 1 capsule (200 mg total) by mouth 3 (three) times daily as needed for cough. (Patient not taking: Reported on 02/21/2019) 30 capsule 0  . Brinzolamide-Brimonidine (SIMBRINZA) 1-0.2 % SUSP Simbrinza 1 %-0.2 % eye drops,suspension    . dipyridamole-aspirin (AGGRENOX) 200-25 MG 12hr capsule Take 1 capsule by mouth 2 (two) times daily.    . dorzolamide (TRUSOPT) 2 % ophthalmic solution Place 1 drop into both eyes 2 (two) times daily.    . fluticasone (FLONASE) 50 MCG/ACT nasal spray Place 2 sprays into both nostrils daily. 16 g 0  . glipiZIDE (GLUCOTROL) 5 MG tablet Take 5 mg by mouth daily before breakfast.    . HYDROcodone-acetaminophen (NORCO) 5-325 MG tablet Take 1-2 tablets by mouth every 6 (six) hours as needed for moderate pain. 40 tablet 0  . latanoprost (XALATAN) 0.005 % ophthalmic solution Place 1 drop into both eyes at bedtime.    Marland Kitchen  lisinopril (PRINIVIL,ZESTRIL) 5 MG tablet Take 5 mg by mouth daily as needed (hypertension).     . meclizine (ANTIVERT) 25 MG tablet Take 1 tablet (25 mg total) by mouth 3 (three) times daily as needed for dizziness. (Patient not taking: Reported on 02/21/2019) 30 tablet 0  . metFORMIN (GLUCOPHAGE) 500 MG tablet Take 500 mg by mouth 2 (two) times daily with a meal.    . simvastatin (ZOCOR) 20 MG tablet Take 20 mg by mouth at bedtime.     No current  facility-administered medications for this visit.    OBJECTIVE: There were no vitals filed for this visit.   There is no height or weight on file to calculate BMI.    ECOG FS:1 - Symptomatic but completely ambulatory  General: Well-developed, well-nourished, no acute distress. Eyes: Pink conjunctiva, anicteric sclera. HEENT: Normocephalic, moist mucous membranes. Lungs: No audible wheezing or coughing. Heart: Regular rate and rhythm. Abdomen: Soft, nontender, no obvious distention. Musculoskeletal: No edema, cyanosis, or clubbing. Neuro: Alert, answering all questions appropriately. Cranial nerves grossly intact. Skin: No rashes or petechiae noted. Psych: Normal affect. Lymphatics: No cervical, calvicular, axillary or inguinal LAD.   LAB RESULTS:  Lab Results  Component Value Date   NA 133 (L) 02/15/2019   K 4.6 02/15/2019   CL 104 02/15/2019   CO2 24 02/15/2019   GLUCOSE 93 02/15/2019   BUN 10 02/15/2019   CREATININE 0.95 02/15/2019   CALCIUM 8.7 (L) 02/15/2019   PROT 7.8 02/15/2019   ALBUMIN 4.1 02/15/2019   AST 26 02/15/2019   ALT 22 02/15/2019   ALKPHOS 39 02/15/2019   BILITOT 0.9 02/15/2019   GFRNONAA >60 02/15/2019   GFRAA >60 02/15/2019    Lab Results  Component Value Date   WBC 6.5 02/15/2019   NEUTROABS 3.2 02/15/2019   HGB 14.1 02/15/2019   HCT 42.6 02/15/2019   MCV 94.2 02/15/2019   PLT 204 02/15/2019     STUDIES: CT ABDOMEN PELVIS W CONTRAST  Result Date: 02/15/2019 CLINICAL DATA:  Right flank pain for approximately 10 days. The patient had hematuria at the onset of flank pain which has subsequently resolved. EXAM: CT ABDOMEN AND PELVIS WITH CONTRAST TECHNIQUE: Multidetector CT imaging of the abdomen and pelvis was performed using the standard protocol following bolus administration of intravenous contrast. CONTRAST:  100 mL OMNIPAQUE IOHEXOL 300 MG/ML  SOLN COMPARISON:  None. FINDINGS: Lower chest: No pleural or pericardial effusion. Lung bases are  clear. Hepatobiliary: The liver is diffusely low attenuating consistent with fatty infiltration. A cyst or hemangioma in the dome of the liver measuring 2.6 cm is noted. There is also a second small cyst inferior in the right hepatic lobe measuring 0.8 cm. No other focal liver lesion. Gallbladder and biliary tree appear normal. Pancreas: Unremarkable. No pancreatic ductal dilatation or surrounding inflammatory changes. Spleen: Normal in size without focal abnormality. Adrenals/Urinary Tract: The adrenal glands appear normal. There is a 0.5 cm nonobstructing stone in the lower pole the left kidney. No other urinary tract stones are identified. The urinary bladder is unremarkable. Stomach/Bowel: Stomach is within normal limits. Appendix appears normal. No evidence of bowel wall thickening, distention, or inflammatory changes. Diverticulosis without diverticulitis noted. Vascular/Lymphatic: There is a left external iliac lymph node measuring 3.9 x 4.1 cm on image 6 series 2. No other lymphadenopathy is identified. Atherosclerosis noted. Reproductive: The prostate gland is enlarged with an irregular, lobulated border throughout. Other: None. Musculoskeletal: There is a destructive lesion in the T12 vertebral body with  an associated mild pathologic compression fracture. No bony retropulsion or involvement of the posterior elements. No other focal bony lesion. IMPRESSION: Findings consistent with neoplastic process including a large left external iliac lymph node and a destructive lesion in T12. Prostate cancer is suspected but the primary lesion is not definitely identified. Punctate nonobstructing stone lower pole left kidney. Atherosclerosis. Fatty infiltration of the liver. Diverticulosis without diverticulitis. These results will be called to the ordering clinician or representative by the Radiologist Assistant, and communication documented in the PACS or zVision Dashboard. Electronically Signed   By: Inge Rise M.D.   On: 02/15/2019 12:13    ASSESSMENT: Lymphadenopathy, T12 bony lesion  PLAN:    1. Lymphadenopathy, T12 bony lesion: Highly suspicious for underlying malignancy, possibly prostate cancer.  PSA, SPEP, CA 19-9, and CEA are pending at time of dictation.  We will also get a CT of the chest as well as a nuclear med bone scan to complete the staging work-up.  Ultimately, patient will likely require a biopsy to confirm the diagnosis.  Return to clinic in 1 to 2 weeks after his imaging is completed to discuss the results and treatment planning if necessary. 2.  Pain: Patient was given a refill of his Vicodin today.  Ultimately, he likely will require XRT to the T12 lesion.  I spent a total of 60 minutes face-to-face with the patient and reviewing chart data of which greater than 50% of the visit was spent in counseling and coordination of care as detailed above.   Patient expressed understanding and was in agreement with this plan. He also understands that He can call clinic at any time with any questions, concerns, or complaints.   Cancer Staging No matching staging information was found for the patient.  Lloyd Huger, MD   02/25/2019 8:43 AM

## 2019-02-27 ENCOUNTER — Ambulatory Visit: Payer: Medicare Other | Admitting: Physical Therapy

## 2019-02-28 ENCOUNTER — Ambulatory Visit: Admission: RE | Admit: 2019-02-28 | Payer: Medicare Other | Source: Ambulatory Visit

## 2019-03-01 ENCOUNTER — Ambulatory Visit: Payer: Medicare Other | Admitting: Physical Therapy

## 2019-03-02 ENCOUNTER — Inpatient Hospital Stay: Payer: Medicare Other | Admitting: Oncology

## 2019-03-03 ENCOUNTER — Other Ambulatory Visit: Payer: Self-pay

## 2019-03-03 ENCOUNTER — Ambulatory Visit: Payer: Medicare Other | Admitting: Internal Medicine

## 2019-03-03 ENCOUNTER — Encounter
Admission: RE | Admit: 2019-03-03 | Discharge: 2019-03-03 | Disposition: A | Discharge status: Home / Self Care | Payer: Medicare Other | Source: Ambulatory Visit | Attending: Oncology | Admitting: Oncology

## 2019-03-03 DIAGNOSIS — R591 Generalized enlarged lymph nodes: Secondary | ICD-10-CM | POA: Diagnosis not present

## 2019-03-03 MED ORDER — TECHNETIUM TC 99M MEDRONATE IV KIT
20.0000 | PACK | Freq: Once | INTRAVENOUS | Status: AC | PRN
  Administered 2019-03-03: 22.559 via INTRAVENOUS

## 2019-03-06 ENCOUNTER — Encounter: Payer: Medicaid Other | Admitting: Physical Therapy

## 2019-03-08 ENCOUNTER — Encounter: Payer: Medicaid Other | Admitting: Physical Therapy

## 2019-03-10 ENCOUNTER — Ambulatory Visit
Admission: RE | Admit: 2019-03-10 | Discharge: 2019-03-10 | Disposition: A | Discharge status: Home / Self Care | Payer: Medicare Other | Source: Ambulatory Visit | Attending: Oncology | Admitting: Oncology

## 2019-03-10 ENCOUNTER — Other Ambulatory Visit: Payer: Self-pay

## 2019-03-10 DIAGNOSIS — R591 Generalized enlarged lymph nodes: Secondary | ICD-10-CM | POA: Diagnosis not present

## 2019-03-10 MED ORDER — IOHEXOL 300 MG/ML  SOLN
75.0000 mL | Freq: Once | INTRAMUSCULAR | Status: AC | PRN
  Administered 2019-03-10: 75 mL via INTRAVENOUS

## 2019-03-10 NOTE — Progress Notes (Signed)
Patient pre screened for office appointment, no questions or concerns today. Patient reminded of upcoming appointment time and date. 

## 2019-03-11 DIAGNOSIS — C61 Malignant neoplasm of prostate: Secondary | ICD-10-CM | POA: Insufficient documentation

## 2019-03-11 NOTE — Progress Notes (Signed)
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  INTERVAL HISTORY: Patient returns to clinic today for further evaluation and discussion of his imaging results.  He continues to have back and right flank pain, but otherwise feels well.  He denies any other pain.  He has no neurologic complaints.  He denies any recent fevers or illnesses.  He has a good appetite and denies weight loss.  He has no chest pain, shortness of breath, cough, or hemoptysis.  He denies any nausea, vomiting, constipation, or diarrhea.  He has no urinary complaints.  Patient offers no further specific complaints today.  REVIEW OF SYSTEMS:   Review of Systems  Constitutional: Negative.  Negative for fever and malaise/fatigue.  Respiratory: Negative.  Negative for cough and shortness of breath.   Cardiovascular: Negative.  Negative for chest pain and leg swelling.  Gastrointestinal: Negative.  Negative for abdominal pain.  Genitourinary: Positive for flank pain.  Musculoskeletal: Positive for back pain.  Skin: Negative.  Negative for rash.  Neurological: Negative.  Negative for dizziness, focal weakness, weakness and headaches.  Psychiatric/Behavioral: Negative.  The patient is not nervous/anxious.     As per HPI. Otherwise, a complete review of systems is negative.  PAST MEDICAL HISTORY: Past Medical History:  Diagnosis Date  . Diabetes mellitus without complication ([place])   . Glaucoma   . Hyperlipidemia   . Hypertension     PAST SURGICAL HISTORY: Past Surgical History:  Procedure Laterality Date  . EYE SURGERY      FAMILY HISTORY: Family History  Problem Relation Age of Onset  . Heart attack Mother   . Diabetes Father      ADVANCED DIRECTIVES (Y/N):  N  HEALTH MAINTENANCE: Social History   Tobacco Use  . Smoking status: Former Smoker    Quit date: 02/08/1987    Years since quitting: 32.1  . Smokeless tobacco: Never Used  Substance Use Topics  . Alcohol use: No  . Drug use: No     Colonoscopy:  PAP:  Bone density:  Lipid panel:  No Known Allergies  Current Outpatient Medications  Medication Sig Dispense Refill  . acetaminophen (TYLENOL) 650 MG CR tablet Take 650 mg by mouth 2 (two) times daily.    . benzonatate (TESSALON) 200 MG capsule Take 1 capsule (200 mg total) by mouth 3 (three) times daily as needed for cough. 30 capsule 0  . Brinzolamide-Brimonidine (SIMBRINZA) 1-0.2 % SUSP Simbrinza 1 %-0.2 % eye drops,suspension    . dipyridamole-aspirin (AGGRENOX) 200-25 MG 12hr capsule Take 1 capsule by mouth 2 (two) times daily.    . dorzolamide (TRUSOPT) 2 % ophthalmic solution Place 1 drop into both eyes 2 (two) times daily.    . fluticasone (FLONASE) 50 MCG/ACT nasal spray Place 2 sprays into both nostrils daily. 16 g 0  . glipiZIDE (GLUCOTROL) 5 MG tablet Take 5 mg by mouth daily before breakfast.    . HYDROcodone-acetaminophen (NORCO) 5-325 MG tablet Take 1 tablet by mouth every 6 (six) hours as needed for moderate pain. Take 2 at night as needed. 90 tablet 0  . latanoprost (XALATAN) 0.005 % ophthalmic solution Place 1 drop into both eyes at bedtime.    Marland Kitchen lisinopril (PRINIVIL,ZESTRIL) 5 MG tablet Take 5 mg by mouth daily as needed (hypertension).     Marland Kitchen  meclizine (ANTIVERT) 25 MG tablet Take 1 tablet (25 mg total) by mouth 3 (three) times daily as needed for dizziness. 30 tablet 0  . metFORMIN (GLUCOPHAGE) 500 MG tablet Take 500 mg by mouth 2 (two) times daily with a meal.    . simvastatin (ZOCOR) 20 MG tablet Take 20 mg by mouth at bedtime.     No current facility-administered medications for this visit.    OBJECTIVE: Vitals:   03/13/19 0904  BP: (!) 144/86  Pulse: 66  Resp: 18   Temp: (!) 96.8 F (36 C)     Body mass index is 37.1 kg/m.    ECOG FS:1 - Symptomatic but completely ambulatory  General: Well-developed, well-nourished, no acute distress. Eyes: Pink conjunctiva, anicteric sclera. HEENT: Normocephalic, moist mucous membranes. Lungs: No audible wheezing or coughing. Heart: Regular rate and rhythm. Abdomen: Soft, nontender, no obvious distention. Musculoskeletal: No edema, cyanosis, or clubbing. Neuro: Alert, answering all questions appropriately. Cranial nerves grossly intact. Skin: No rashes or petechiae noted. Psych: Normal affect.  LAB RESULTS:  Lab Results  Component Value Date   NA 133 (L) 02/15/2019   K 4.6 02/15/2019   CL 104 02/15/2019   CO2 24 02/15/2019   GLUCOSE 93 02/15/2019   BUN 10 02/15/2019   CREATININE 0.95 02/15/2019   CALCIUM 8.7 (L) 02/15/2019   PROT 7.8 02/15/2019   ALBUMIN 4.1 02/15/2019   AST 26 02/15/2019   ALT 22 02/15/2019   ALKPHOS 39 02/15/2019   BILITOT 0.9 02/15/2019   GFRNONAA >60 02/15/2019   GFRAA >60 02/15/2019    Lab Results  Component Value Date   WBC 6.5 02/15/2019   NEUTROABS 3.2 02/15/2019   HGB 14.1 02/15/2019   HCT 42.6 02/15/2019   MCV 94.2 02/15/2019   PLT 204 02/15/2019     STUDIES: CT Chest W Contrast  Result Date: 03/10/2019 CLINICAL DATA:  Abdominal CT demonstrating left pelvic adenopathy and a destructive lesion at T12. Abnormal bone scan. EXAM: CT CHEST WITH CONTRAST TECHNIQUE: Multidetector CT imaging of the chest was performed during intravenous contrast administration. CONTRAST:  8mL OMNIPAQUE IOHEXOL 300 MG/ML  SOLN COMPARISON:  Bone scan 03/03/2019. Abdominopelvic CT 02/15/2019. Chest radiograph 02/07/2017. FINDINGS: Cardiovascular: Aortic atherosclerosis. Tortuous thoracic aorta. Borderline cardiomegaly, without pericardial effusion. Multivessel coronary artery atherosclerosis. No central pulmonary embolism, on this non-dedicated study. Pulmonary artery enlargement, outflow  tract 3.6 cm Mediastinum/Nodes: No supraclavicular adenopathy. No mediastinal or hilar adenopathy. Lungs/Pleura: No pleural fluid. Moderate centrilobular and paraseptal emphysema. 3 mm left upper lobe pulmonary nodule on 53/3. A more inferior subpleural left upper lobe 5 mm nodule on 71/3. Mild left hemidiaphragm elevation. Subjacent left lower lobe mild atelectasis and volume loss. More cephalad left lower lobe scarring. Upper Abdomen: Hepatic steatosis. High right hepatic lobe cyst. Smaller right hepatic lobe lesions are less conspicuous today. Normal imaged portions of the spleen, stomach, pancreas, gallbladder, adrenal glands, kidneys. Abdominal aortic atherosclerosis. Musculoskeletal: Lytic lesion within the right-side of T12, including at 4.0 cm on 142/2. Mild secondary compression deformity. This extends into the right pedicle. IMPRESSION: 1. Lytic T12 osseous lesion, as before. 2. No evidence of soft tissue metastasis or primary malignancy within the chest. 3. Small pulmonary nodules, nonspecific. 4. Aortic atherosclerosis (ICD10-I70.0), coronary artery atherosclerosis and emphysema (ICD10-J43.9). 5. Hepatic steatosis. Electronically Signed   By: Abigail Miyamoto M.D.   On: 03/10/2019 15:56   NM Bone Scan Whole Body  Result Date: 03/03/2019 CLINICAL DATA:  Bone lesion and lymphadenopathy in the pelvis. EXAM: NUCLEAR MEDICINE  WHOLE BODY BONE SCAN TECHNIQUE: Whole body anterior and posterior images were obtained approximately 3 hours after intravenous injection of radiopharmaceutical. RADIOPHARMACEUTICALS:  22.7 mCi Technetium-69m MDP IV COMPARISON:  CT 02/15/2019 FINDINGS: Focal uptake in the L1 vertebral body corresponds to lytic lesion on comparison CT and consistent with bone metastasis. Small focus of uptake within the RIGHT scapula body. No additional foci radiotracer uptake to suggest skeletal metastasis. 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 Indeterminate lesion in the RIGHT scapula  body. Electronically Signed   By: Suzy Bouchard M.D.   On: 03/03/2019 16:10   CT ABDOMEN PELVIS W CONTRAST  Result Date: 02/15/2019 CLINICAL DATA:  Right flank pain for approximately 10 days. The patient had hematuria at the onset of flank pain which has subsequently resolved. EXAM: CT ABDOMEN AND PELVIS WITH CONTRAST TECHNIQUE: Multidetector CT imaging of the abdomen and pelvis was performed using the standard protocol following bolus administration of intravenous contrast. CONTRAST:  100 mL OMNIPAQUE IOHEXOL 300 MG/ML  SOLN COMPARISON:  None. FINDINGS: Lower chest: No pleural or pericardial effusion. Lung bases are clear. Hepatobiliary: The liver is diffusely low attenuating consistent with fatty infiltration. A cyst or hemangioma in the dome of the liver measuring 2.6 cm is noted. There is also a second small cyst inferior in the right hepatic lobe measuring 0.8 cm. No other focal liver lesion. Gallbladder and biliary tree appear normal. Pancreas: Unremarkable. No pancreatic ductal dilatation or surrounding inflammatory changes. Spleen: Normal in size without focal abnormality. Adrenals/Urinary Tract: The adrenal glands appear normal. There is a 0.5 cm nonobstructing stone in the lower pole the left kidney. No other urinary tract stones are identified. The urinary bladder is unremarkable. Stomach/Bowel: Stomach is within normal limits. Appendix appears normal. No evidence of bowel wall thickening, distention, or inflammatory changes. Diverticulosis without diverticulitis noted. Vascular/Lymphatic: There is a left external iliac lymph node measuring 3.9 x 4.1 cm on image 6 series 2. No other lymphadenopathy is identified. Atherosclerosis noted. Reproductive: The prostate gland is enlarged with an irregular, lobulated border throughout. Other: None. Musculoskeletal: There is a destructive lesion in the T12 vertebral body with an associated mild pathologic compression fracture. No bony retropulsion or  involvement of the posterior elements. No other focal bony lesion. IMPRESSION: Findings consistent with neoplastic process including a large left external iliac lymph node and a destructive lesion in T12. Prostate cancer is suspected but the primary lesion is not definitely identified. Punctate nonobstructing stone lower pole left kidney. Atherosclerosis. Fatty infiltration of the liver. Diverticulosis without diverticulitis. These results will be called to the ordering clinician or representative by the Radiologist Assistant, and communication documented in the PACS or zVision Dashboard. Electronically Signed   By: Inge Rise M.D.   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  PLAN:    1. Lymphadenopathy, T12 bony lesion: CT scan and bone scan results reviewed independently and reported as above.  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  Patient will have appointments with interventional radiology as well as urology for biopsy and confirmation of diagnosis.  Ultimately, he will require XRT to his T12/L1 lesion for palliative treatment of his pain.  He will also benefit from Lupron in the near future.  Return to clinic approximately 1 week after his biopsy for discussion of his results and treatment planning. 2.  Pain: Patient states it is worse at night, therefore he was given a refill of his  Vicodin today and instructed to take 2 prior to going to bed.  Ultimately he will require XRT as above.  I spent a total of 30 minutes reviewing chart data, face-to-face evaluation with the patient, counseling and coordination of care as detailed above.   Patient expressed understanding and was in agreement with this plan. He also understands that He can call clinic at any time with any questions, concerns, or complaints.   Cancer Staging No matching staging information was found for the patient.  Lloyd Huger, MD   03/13/2019 10:07 AM

## 2019-03-13 ENCOUNTER — Encounter (INDEPENDENT_AMBULATORY_CARE_PROVIDER_SITE_OTHER): Payer: Medicare Other | Admitting: Ophthalmology

## 2019-03-13 ENCOUNTER — Other Ambulatory Visit: Payer: Self-pay | Admitting: Emergency Medicine

## 2019-03-13 ENCOUNTER — Encounter: Payer: Self-pay | Admitting: Oncology

## 2019-03-13 ENCOUNTER — Other Ambulatory Visit: Payer: Self-pay

## 2019-03-13 ENCOUNTER — Inpatient Hospital Stay (HOSPITAL_BASED_OUTPATIENT_CLINIC_OR_DEPARTMENT_OTHER): Payer: Medicare Other | Admitting: Oncology

## 2019-03-13 ENCOUNTER — Other Ambulatory Visit: Payer: Self-pay | Admitting: Interventional Radiology

## 2019-03-13 DIAGNOSIS — S22000A Wedge compression fracture of unspecified thoracic vertebra, initial encounter for closed fracture: Secondary | ICD-10-CM

## 2019-03-13 DIAGNOSIS — M899 Disorder of bone, unspecified: Secondary | ICD-10-CM | POA: Diagnosis not present

## 2019-03-13 DIAGNOSIS — C61 Malignant neoplasm of prostate: Secondary | ICD-10-CM

## 2019-03-13 DIAGNOSIS — S32000A Wedge compression fracture of unspecified lumbar vertebra, initial encounter for closed fracture: Secondary | ICD-10-CM

## 2019-03-13 DIAGNOSIS — R591 Generalized enlarged lymph nodes: Secondary | ICD-10-CM | POA: Diagnosis not present

## 2019-03-13 MED ORDER — HYDROCODONE-ACETAMINOPHEN 5-325 MG PO TABS: 1.0000 | ORAL_TABLET | Freq: Four times a day (QID) | ORAL | 0 refills | Status: DC | PRN

## 2019-03-13 NOTE — Progress Notes (Signed)
Patients only concern today is finding out what is going on with his right hip area pain

## 2019-03-14 ENCOUNTER — Encounter: Payer: Medicaid Other | Admitting: Physical Therapy

## 2019-03-16 ENCOUNTER — Ambulatory Visit: Admission: RE | Admit: 2019-03-16 | Payer: Medicare Other | Source: Ambulatory Visit

## 2019-03-16 ENCOUNTER — Ambulatory Visit: Payer: Medicare Other

## 2019-03-16 ENCOUNTER — Encounter: Payer: Medicaid Other | Admitting: Physical Therapy

## 2019-03-19 ENCOUNTER — Ambulatory Visit: Payer: Medicare Other

## 2019-03-19 ENCOUNTER — Ambulatory Visit
Admission: RE | Admit: 2019-03-19 | Discharge: 2019-03-19 | Disposition: A | Discharge status: Home / Self Care | Payer: Medicare Other | Source: Ambulatory Visit | Attending: Interventional Radiology | Admitting: Interventional Radiology

## 2019-03-19 DIAGNOSIS — S32000A Wedge compression fracture of unspecified lumbar vertebra, initial encounter for closed fracture: Secondary | ICD-10-CM | POA: Insufficient documentation

## 2019-03-19 DIAGNOSIS — S22000A Wedge compression fracture of unspecified thoracic vertebra, initial encounter for closed fracture: Secondary | ICD-10-CM | POA: Insufficient documentation

## 2019-03-19 MED ORDER — GADOBUTROL 1 MMOL/ML IV SOLN
10.0000 mL | Freq: Once | INTRAVENOUS | Status: AC | PRN
  Administered 2019-03-19: 17:00:00 10 mL via INTRAVENOUS

## 2019-03-20 ENCOUNTER — Telehealth: Payer: Self-pay | Admitting: Emergency Medicine

## 2019-03-20 NOTE — Telephone Encounter (Signed)
Called pt to let him know that we would schedule him for f/u once his biopsy is scheduled. Currently, pt has only had MRI and is to have appt with Dr. Pascal Lux tomorrow. Pt verbalized understanding and didn't have any further questions or concerns.

## 2019-03-21 ENCOUNTER — Other Ambulatory Visit (HOSPITAL_COMMUNITY): Payer: Medicare Other

## 2019-03-21 ENCOUNTER — Other Ambulatory Visit (HOSPITAL_COMMUNITY): Payer: Self-pay | Admitting: Interventional Radiology

## 2019-03-21 ENCOUNTER — Encounter: Payer: Self-pay | Admitting: *Deleted

## 2019-03-21 ENCOUNTER — Other Ambulatory Visit: Payer: Self-pay

## 2019-03-21 ENCOUNTER — Ambulatory Visit
Admission: RE | Admit: 2019-03-21 | Discharge: 2019-03-21 | Disposition: A | Discharge status: Home / Self Care | Payer: Medicare Other | Source: Ambulatory Visit | Attending: Interventional Radiology | Admitting: Interventional Radiology

## 2019-03-21 DIAGNOSIS — S32000A Wedge compression fracture of unspecified lumbar vertebra, initial encounter for closed fracture: Secondary | ICD-10-CM

## 2019-03-21 DIAGNOSIS — S22080A Wedge compression fracture of T11-T12 vertebra, initial encounter for closed fracture: Secondary | ICD-10-CM

## 2019-03-21 DIAGNOSIS — S22000A Wedge compression fracture of unspecified thoracic vertebra, initial encounter for closed fracture: Secondary | ICD-10-CM

## 2019-03-21 HISTORY — PX: IR RADIOLOGIST EVAL & MGMT: IMG5224

## 2019-03-21 NOTE — Consult Note (Signed)
Chief Complaint: Back Pain   Referring Physician(s): Dr. Grayland Ormond  History of Present Illness: Evan Gonzalez is a 79 y.o. male presenting as a scheduled consultation to [facility] today, kindly referred by Dr. Grayland Ormond, for evaluation of candidacy for possible RFA and vertebral augmentation, with biopsy, of symptomatic T12 pathologic fracture.   Evan Gonzalez joins [place] today by telemedicine visit.  I confirmed his identify with 2 identifiers.  He tells me that he has severe pain in his lower back, that is referred from the mid low back to the hips, of several weeks now. This pain brought him to the ED on 02/15/2019, and subsequent imaging workup reveals pathologic replacement of T12 vertebral body on MRI.  His suspected diagnosis is prostate carcinoma, with PSA of 228, CEA normal, CA 19-9 normal, and no findings on protein electrophoresis to suggest MxMyeloma.   He describes his pain as 10/10 intensity, most days of the week, which is slightly improved by vicodin.  The pain does affect his day to day activity, by way of not allowing him to perform his ADL's comfortably.     Past Medical History:  Diagnosis Date  . Diabetes mellitus without complication ([place])   . Glaucoma   . Hyperlipidemia   . Hypertension     Past Surgical History:  Procedure Laterality Date  . EYE SURGERY      Allergies: Patient has no known allergies.  Medications: Prior to Admission medications   Medication Sig Start Date End Date Taking? Authorizing Provider  acetaminophen (TYLENOL) 650 MG CR tablet Take 650 mg by mouth 2 (two) times daily.    [provider]  benzonatate (TESSALON) 200 MG capsule Take 1 capsule (200 mg total) by mouth 3 (three) times daily as needed for cough. 02/07/17   Melynda Ripple, MD  Brinzolamide-Brimonidine [facility]) 1-0.2 % SUSP Simbrinza 1 %-0.2 % eye drops,suspension    [provider]  dipyridamole-aspirin (AGGRENOX) 200-25 MG 12hr capsule Take  1 capsule by mouth 2 (two) times daily.    [provider]  dorzolamide (TRUSOPT) 2 % ophthalmic solution Place 1 drop into both eyes 2 (two) times daily.    [provider]  fluticasone (FLONASE) 50 MCG/ACT nasal spray Place 2 sprays into both nostrils daily. 02/07/17   Melynda Ripple, MD  glipiZIDE (GLUCOTROL) 5 MG tablet Take 5 mg by mouth daily before breakfast.    [provider]  HYDROcodone-acetaminophen (NORCO) 5-325 MG tablet Take 1 tablet by mouth every 6 (six) hours as needed for moderate pain. Take 2 at night as needed. 03/13/19   Lloyd Huger, MD  latanoprost (XALATAN) 0.005 % ophthalmic solution Place 1 drop into both eyes at bedtime.    [provider]  lisinopril (PRINIVIL,ZESTRIL) 5 MG tablet Take 5 mg by mouth daily as needed (hypertension).     [provider]  meclizine (ANTIVERT) 25 MG tablet Take 1 tablet (25 mg total) by mouth 3 (three) times daily as needed for dizziness. 09/24/17   Harvest Dark, MD  metFORMIN (GLUCOPHAGE) 500 MG tablet Take 500 mg by mouth 2 (two) times daily with a meal.    [provider]  simvastatin (ZOCOR) 20 MG tablet Take 20 mg by mouth at bedtime.    [provider]     Family History  Problem Relation Age of Onset  . Heart attack Mother   . Diabetes Father     Social History   Socioeconomic History  . Marital status: Single  Spouse name: Not on file  . Number of children: Not on file  . Years of education: Not on file  . Highest education level: Not on file  Occupational History  . Not on file  Tobacco Use  . Smoking status: Former Smoker    Quit date: 02/08/1987    Years since quitting: 32.1  . Smokeless tobacco: Never Used  Substance and Sexual Activity  . Alcohol use: No  . Drug use: No  . Sexual activity: Not on file  Other Topics Concern  . Not on file  Social History Narrative  . Not on file   Social Determinants of Health   Financial  Resource Strain:   . Difficulty of Paying Living Expenses: Not on file  Food Insecurity:   . Worried About Charity fundraiser in the Last Year: Not on file  . Ran Out of Food in the Last Year: Not on file  Transportation Needs:   . Lack of Transportation (Medical): Not on file  . Lack of Transportation (Non-Medical): Not on file  Physical Activity:   . Days of Exercise per Week: Not on file  . Minutes of Exercise per Session: Not on file  Stress:   . Feeling of Stress : Not on file  Social Connections:   . Frequency of Communication with Friends and Family: Not on file  . Frequency of Social Gatherings with Friends and Family: Not on file  . Attends Religious Services: Not on file  . Active Member of Clubs or Organizations: Not on file  . Attends Archivist Meetings: Not on file  . Marital Status: Not on file    ECOG Status: 1 - Symptomatic but completely ambulatory  Review of Systems  Review of Systems: A 12 point ROS discussed and pertinent positives are indicated in the HPI above.  All other systems are negative.  Physical Exam No direct physical exam was performed (except for noted visual exam findings with Video Visits).    Vital Signs: There were no vitals taken for this visit.  Imaging: CT Chest W Contrast  Result Date: 03/10/2019 CLINICAL DATA:  Abdominal CT demonstrating left pelvic adenopathy and a destructive lesion at T12. Abnormal bone scan. EXAM: CT CHEST WITH CONTRAST TECHNIQUE: Multidetector CT imaging of the chest was performed during intravenous contrast administration. CONTRAST:  35mL OMNIPAQUE IOHEXOL 300 MG/ML  SOLN COMPARISON:  Bone scan 03/03/2019. Abdominopelvic CT 02/15/2019. Chest radiograph 02/07/2017. FINDINGS: Cardiovascular: Aortic atherosclerosis. Tortuous thoracic aorta. Borderline cardiomegaly, without pericardial effusion. Multivessel coronary artery atherosclerosis. No central pulmonary embolism, on this non-dedicated study.  Pulmonary artery enlargement, outflow tract 3.6 cm Mediastinum/Nodes: No supraclavicular adenopathy. No mediastinal or hilar adenopathy. Lungs/Pleura: No pleural fluid. Moderate centrilobular and paraseptal emphysema. 3 mm left upper lobe pulmonary nodule on 53/3. A more inferior subpleural left upper lobe 5 mm nodule on 71/3. Mild left hemidiaphragm elevation. Subjacent left lower lobe mild atelectasis and volume loss. More cephalad left lower lobe scarring. Upper Abdomen: Hepatic steatosis. High right hepatic lobe cyst. Smaller right hepatic lobe lesions are less conspicuous today. Normal imaged portions of the spleen, stomach, pancreas, gallbladder, adrenal glands, kidneys. Abdominal aortic atherosclerosis. Musculoskeletal: Lytic lesion within the right-side of T12, including at 4.0 cm on 142/2. Mild secondary compression deformity. This extends into the right pedicle. IMPRESSION: 1. Lytic T12 osseous lesion, as before. 2. No evidence of soft tissue metastasis or primary malignancy within the chest. 3. Small pulmonary nodules, nonspecific. 4. Aortic atherosclerosis (ICD10-I70.0), coronary artery atherosclerosis and  emphysema (ICD10-J43.9). 5. Hepatic steatosis. Electronically Signed   By: Abigail Miyamoto M.D.   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 EXAM: MRI THORACIC AND LUMBAR SPINE WITHOUT AND WITH CONTRAST TECHNIQUE: Multiplanar and multiecho pulse sequences of the thoracic and lumbar spine were obtained without and with intravenous contrast. CONTRAST:  77mL GADAVIST GADOBUTROL 1 MMOL/ML IV SOLN COMPARISON:  Comparison made with prior CT from 02/15/2019 as well as prior bone scan from 03/03/2019. FINDINGS: MRI THORACIC SPINE FINDINGS Alignment: Vertebral bodies normally aligned with preservation of the normal thoracic kyphosis. No  listhesis or subluxation. Vertebrae: Destructive metastatic lesion seen involving the T12 vertebral body, corresponding with findings seen on previous examinations. There is an associated pathologic compression fracture with mild 20% height loss. Mild convex bowing of the posterior margin of the vertebral body related to tumor without significant bony retropulsion. No significant stenosis. Lesion does partially extend into the right pedicle of T12 (series 21, image 5). No epidural or significant paraspinous extension. No other metastatic lesions seen elsewhere within the thoracic spine. Underlying bone marrow signal intensity diffusely heterogeneous. Note made of a 13 mm benign hemangioma within the T1 vertebral body. Adjacent T1 hypointensity without associated STIR correlate or enhancement noted (series 19, image 9), indeterminate, but most likely benign. No other discrete or worrisome osseous lesions. Vertebral body height otherwise maintained without acute or chronic fracture. No other abnormal marrow edema or enhancement. Cord: Signal intensity within the thoracic spinal cord is normal. Normal cord caliber and morphology. No epidural tumor or abnormal enhancement. Paraspinal and other soft tissues: Paraspinous soft tissues demonstrate no acute finding. Partially visualized lungs are grossly clear. Visualized visceral structures within normal limits. Disc levels: Mild for age scattered multilevel disc bulging seen within the mid and lower thoracic spine. Scattered multilevel facet hypertrophy noted as well, most notable at T9-10 through T11-12. No significant canal or foraminal stenosis. No neural impingement. MRI LUMBAR SPINE FINDINGS Segmentation: Transitional lumbosacral anatomy with partial sacralization of the L5 vertebral body. Alignment: Mild levoscoliosis. 3 mm chronic facet mediated anterolisthesis of L4 on L5. Alignment otherwise normal with preservation of the normal lumbar lordosis. Vertebrae:  Vertebral body height maintained without evidence for acute or chronic fracture. Bone marrow signal intensity diffusely heterogeneous. No discrete or worrisome osseous lesions. No abnormal marrow edema or enhancement. No other evidence for metastatic disease within the lumbar spine or visualized sacrum/pelvis. Conus medullaris: Extends to the L2 level and appears normal. No abnormal enhancement or epidural tumor. Paraspinal and other soft tissues: Paraspinous soft tissues demonstrate no acute finding. No paraspinous mass or abnormal enhancement. Enlarged 3.9 cm left external iliac node noted, corresponding with abnormality seen on prior CT. Remainder the visualized visceral structures otherwise unremarkable. Disc levels: L1-2: Normal interspace. Mild bilateral facet hypertrophy. No canal or foraminal stenosis. L2-3: Chronic intervertebral disc space narrowing with diffuse disc bulge and disc desiccation. Associated mild reactive endplate changes. Superimposed shallow left extraforaminal disc protrusion closely approximates the exiting left L2 nerve root as it courses of the left neural foramen (series 4, image 20). Mild to moderate facet hypertrophy. No significant spinal stenosis. Foramina remain patent. L3-4: Chronic intervertebral disc space narrowing with diffuse disc bulge and disc desiccation. Superimposed left foraminal disc protrusion seen at the inferior aspect of the left neural foramen (series 4, image 26). Moderate facet and ligament flavum hypertrophy.  Resultant mild spinal stenosis. Foramina remain patent. L4-5: Trace anterolisthesis. Chronic intervertebral disc space narrowing with diffuse disc bulge and disc desiccation. Reactive endplate changes with marginal endplate osteophytic spurring. Moderate to severe right worse than left facet hypertrophy with ligament flavum thickening. Resultant mild canal with bilateral lateral recess stenosis. Mild right worse than left L4 foraminal narrowing. L5-S1:  Transitional lumbosacral anatomy with somewhat rudimentary L5-S1 disc. Mild disc bulge with reactive endplate changes. Moderate left greater than right facet hypertrophy. Resultant mild left greater than right lateral recess stenosis. Moderate bilateral L5 foraminal narrowing, right greater than left. IMPRESSION: 1. 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 No significant bony retropulsion or stenosis. No significant epidural or paraspinous tumor at this time. 2. No other evidence for osseous metastatic disease within the thoracic or lumbar spine. 3. 3.9 cm left external iliac node, consistent with nodal metastasis. 4. Small left foraminal to extraforaminal disc protrusions at L2-3 and L3-4, closely approximating and potentially irritating either of the exiting left L2 or L3 nerve roots respectively. 5. Multifactorial degenerative changes at L4-5 and L5-S1 with resultant mild to moderate bilateral neural foraminal narrowing, with mild spinal stenosis at the L4-5 level. Electronically Signed   By: Jeannine Boga M.D.   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 EXAM: MRI THORACIC AND LUMBAR SPINE WITHOUT AND WITH CONTRAST TECHNIQUE: Multiplanar and multiecho pulse sequences of the thoracic and lumbar spine were obtained without and with intravenous contrast. CONTRAST:  69mL GADAVIST GADOBUTROL 1 MMOL/ML IV SOLN COMPARISON:  Comparison made with prior CT from 02/15/2019 as well as prior bone scan from 03/03/2019. FINDINGS: MRI THORACIC SPINE FINDINGS Alignment: Vertebral bodies normally aligned with preservation of the normal thoracic kyphosis. No listhesis or subluxation. Vertebrae: Destructive metastatic lesion seen involving the T12 vertebral body,  corresponding with findings seen on previous examinations. There is an associated pathologic compression fracture with mild 20% height loss. Mild convex bowing of the posterior margin of the vertebral body related to tumor without significant bony retropulsion. No significant stenosis. Lesion does partially extend into the right pedicle of T12 (series 21, image 5). No epidural or significant paraspinous extension. No other metastatic lesions seen elsewhere within the thoracic spine. Underlying bone marrow signal intensity diffusely heterogeneous. Note made of a 13 mm benign hemangioma within the T1 vertebral body. Adjacent T1 hypointensity without associated STIR correlate or enhancement noted (series 19, image 9), indeterminate, but most likely benign. No other discrete or worrisome osseous lesions. Vertebral body height otherwise maintained without acute or chronic fracture. No other abnormal marrow edema or enhancement. Cord: Signal intensity within the thoracic spinal cord is normal. Normal cord caliber and morphology. No epidural tumor or abnormal enhancement. Paraspinal and other soft tissues: Paraspinous soft tissues demonstrate no acute finding. Partially visualized lungs are grossly clear. Visualized visceral structures within normal limits. Disc levels: Mild for age scattered multilevel disc bulging seen within the mid and lower thoracic spine. Scattered multilevel facet hypertrophy noted as well, most notable at T9-10 through T11-12. No significant canal or foraminal stenosis. No neural impingement. MRI LUMBAR SPINE FINDINGS Segmentation: Transitional lumbosacral anatomy with partial sacralization of the L5 vertebral body. Alignment: Mild levoscoliosis. 3 mm chronic facet mediated anterolisthesis of L4 on L5. Alignment otherwise normal with preservation of the normal lumbar lordosis.  Vertebrae: Vertebral body height maintained without evidence for acute or chronic fracture. Bone marrow signal intensity  diffusely heterogeneous. No discrete or worrisome osseous lesions. No abnormal marrow edema or enhancement. No other evidence for metastatic disease within the lumbar spine or visualized sacrum/pelvis. Conus medullaris: Extends to the L2 level and appears normal. No abnormal enhancement or epidural tumor. Paraspinal and other soft tissues: Paraspinous soft tissues demonstrate no acute finding. No paraspinous mass or abnormal enhancement. Enlarged 3.9 cm left external iliac node noted, corresponding with abnormality seen on prior CT. Remainder the visualized visceral structures otherwise unremarkable. Disc levels: L1-2: Normal interspace. Mild bilateral facet hypertrophy. No canal or foraminal stenosis. L2-3: Chronic intervertebral disc space narrowing with diffuse disc bulge and disc desiccation. Associated mild reactive endplate changes. Superimposed shallow left extraforaminal disc protrusion closely approximates the exiting left L2 nerve root as it courses of the left neural foramen (series 4, image 20). Mild to moderate facet hypertrophy. No significant spinal stenosis. Foramina remain patent. L3-4: Chronic intervertebral disc space narrowing with diffuse disc bulge and disc desiccation. Superimposed left foraminal disc protrusion seen at the inferior aspect of the left neural foramen (series 4, image 26). Moderate facet and ligament flavum hypertrophy. Resultant mild spinal stenosis. Foramina remain patent. L4-5: Trace anterolisthesis. Chronic intervertebral disc space narrowing with diffuse disc bulge and disc desiccation. Reactive endplate changes with marginal endplate osteophytic spurring. Moderate to severe right worse than left facet hypertrophy with ligament flavum thickening. Resultant mild canal with bilateral lateral recess stenosis. Mild right worse than left L4 foraminal narrowing. 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 Mild disc bulge with reactive endplate  changes. Moderate left greater than right facet hypertrophy. Resultant mild left greater than right lateral recess stenosis. Moderate bilateral L5 foraminal narrowing, right greater than left. IMPRESSION: 1. 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 No significant bony retropulsion or stenosis. No significant epidural or paraspinous tumor at this time. 2. No other evidence for osseous metastatic disease within the thoracic or lumbar spine. 3. 3.9 cm left external iliac node, consistent with nodal metastasis. 4. Small left foraminal to extraforaminal disc protrusions at L2-3 and L3-4, closely approximating and potentially irritating either of the exiting left L2 or L3 nerve roots respectively. 5. Multifactorial degenerative changes at L4-5 and L5-S1 with resultant mild to moderate bilateral neural foraminal narrowing, with mild spinal stenosis at the L4-5 level. Electronically Signed   By: Jeannine Boga M.D.   On: 03/19/2019 19:12   NM Bone Scan Whole Body  Result Date: 03/03/2019 CLINICAL DATA:  Bone lesion and lymphadenopathy in the pelvis. EXAM: NUCLEAR MEDICINE WHOLE BODY BONE SCAN TECHNIQUE: Whole body anterior and posterior images were obtained approximately 3 hours after intravenous injection of radiopharmaceutical. RADIOPHARMACEUTICALS:  22.7 mCi Technetium-46m MDP IV COMPARISON:  CT 02/15/2019 FINDINGS: Focal uptake in the L1 vertebral body corresponds to lytic lesion on comparison CT and consistent with bone metastasis. Small focus of uptake within the RIGHT scapula body. No additional foci radiotracer uptake to suggest skeletal metastasis. 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 Indeterminate lesion in the RIGHT scapula body. Electronically Signed   By: Suzy Bouchard M.D.   On: 03/03/2019 16:10    Labs:  CBC: Recent Labs    02/15/19 1043  WBC 6.5  HGB 14.1  HCT 42.6  PLT 204     COAGS: No results for input(s): INR, APTT in the last 8760 hours.  BMP: Recent Labs    02/15/19 1043  NA 133*  K 4.6  CL 104  CO2 24  GLUCOSE 93  BUN 10  CALCIUM 8.7*  CREATININE 0.95  GFRNONAA >60  GFRAA >60    LIVER FUNCTION TESTS: Recent Labs    02/15/19 1043  BILITOT 0.9  AST 26  ALT 22  ALKPHOS 39  PROT 7.8  ALBUMIN 4.1    TUMOR MARKERS: No results for input(s): AFPTM, CEA, CA199, CHROMGRNA in the last 8760 hours.  Assessment and Plan:  Evan Gonzalez is a 79 yo male with imaging diagnosis of metastatic lesion of T12 as etiology for life-style limiting back pain.    My impression is that he is a good candidate for RFA ablation (Osteocool), with vertebral augmentation.    I had a lengthy discussion with Evan Gonzalez regarding the pathology/pathophysiology, and treatment options for such pathology, focusing on image guided ablation and vertebral augmentation.  Risk/benefit analysis was provided, with specific risks to include: bleeding, infection, local injury including nerve injury, unremitting pain, need for hospitalization, need for further procedure/surgery, embolization, anesthesia risk, cardiopulmonary collapse, death.   I did let him know that a person like Evan Gonzalez with 10/10 non-remitting pain stands to have a very good chance of relief with treatment.  Sometimes the relief takes a few days to take effect, secondary to post-op soreness/inflammation.  After our discussion, he would like to proceed with therapy.   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 Earleen Newport - Biopsy of the lesion at the time of treatment - possible on table confirmation of the site of pain with fluoro guided provocative maneuvers - I have advised him to observe his other physician appointments   Thank you for this interesting consult.  I greatly enjoyed meeting Evan Gonzalez and look forward to participating in their care.   A copy of this report was sent to the requesting provider on this date.  Electronically Signed: Corrie Mckusick 03/21/2019, 9:41 AM   I spent a total of  40 Minutes   in remote  clinical consultation, greater than 50% of which was counseling/coordinating care for T12 pathology fracture causing life-style limiting pain, possible RFA ablation, possible kyphoplasty.    Visit type: Audio only (telephone). Audio (no video) only due to patient's lack of internet/smartphone capability. Alternative for in-person consultation at [facility], [address], [place], [place]. This visit type was conducted due to national recommendations for restrictions regarding the COVID-19 Pandemic (e.g. social distancing).  This format is felt to be most appropriate for this patient at this time.  All issues noted in this document were discussed and addressed.

## 2019-03-22 ENCOUNTER — Telehealth (HOSPITAL_COMMUNITY): Payer: Self-pay

## 2019-03-22 ENCOUNTER — Telehealth: Payer: Self-pay | Admitting: *Deleted

## 2019-03-22 NOTE — Telephone Encounter (Signed)
I see that he is scheduled for RFA on the 9th.

## 2019-03-22 NOTE — Telephone Encounter (Signed)
Pt called with questions regarding ablation. Answered all questions and gave him directions. AW

## 2019-03-22 NOTE — Telephone Encounter (Signed)
We can do a video visit if pt wants.

## 2019-03-22 NOTE — Telephone Encounter (Signed)
Not sure what he is referring to.

## 2019-03-22 NOTE — Telephone Encounter (Signed)
Patient called stating he got a call from his [facility] doctor who wants to do surgery on him. He wants to discuss this with Dr Grayland Ormond before he proceeds and requests to speak or see Dr Grayland Ormond. Please call after 11 this morning

## 2019-03-23 ENCOUNTER — Telehealth: Payer: Medicare Other

## 2019-03-23 ENCOUNTER — Other Ambulatory Visit: Payer: Self-pay

## 2019-03-23 ENCOUNTER — Encounter: Payer: Self-pay | Admitting: Oncology

## 2019-03-23 NOTE — Telephone Encounter (Signed)
Pt doesn't have the ability to complete a Video visit. He would like to come in.

## 2019-03-23 NOTE — Telephone Encounter (Signed)
Pt is scheduled for 10:45 tomorrow.

## 2019-03-23 NOTE — Telephone Encounter (Signed)
Schedule message sent. 

## 2019-03-23 NOTE — Telephone Encounter (Signed)
That's fine

## 2019-03-23 NOTE — Progress Notes (Signed)
Patient pre screened for office appointment, no questions or concerns today. Patient reminded of upcoming appointment time and date. 

## 2019-03-24 ENCOUNTER — Other Ambulatory Visit: Payer: Self-pay

## 2019-03-24 ENCOUNTER — Inpatient Hospital Stay: Payer: Medicare Other | Attending: Oncology | Admitting: Oncology

## 2019-03-24 VITALS — BP 152/77 | HR 89 | Temp 98.1°F | Resp 18 | Wt 265.0 lb

## 2019-03-24 DIAGNOSIS — E785 Hyperlipidemia, unspecified: Secondary | ICD-10-CM | POA: Insufficient documentation

## 2019-03-24 DIAGNOSIS — C61 Malignant neoplasm of prostate: Secondary | ICD-10-CM | POA: Insufficient documentation

## 2019-03-24 DIAGNOSIS — M48061 Spinal stenosis, lumbar region without neurogenic claudication: Secondary | ICD-10-CM | POA: Diagnosis not present

## 2019-03-24 DIAGNOSIS — Z79899 Other long term (current) drug therapy: Secondary | ICD-10-CM | POA: Diagnosis not present

## 2019-03-24 DIAGNOSIS — Z79818 Long term (current) use of other agents affecting estrogen receptors and estrogen levels: Secondary | ICD-10-CM | POA: Diagnosis not present

## 2019-03-24 DIAGNOSIS — Z7984 Long term (current) use of oral hypoglycemic drugs: Secondary | ICD-10-CM | POA: Insufficient documentation

## 2019-03-24 DIAGNOSIS — Z87891 Personal history of nicotine dependence: Secondary | ICD-10-CM | POA: Diagnosis not present

## 2019-03-24 DIAGNOSIS — M549 Dorsalgia, unspecified: Secondary | ICD-10-CM | POA: Diagnosis not present

## 2019-03-24 DIAGNOSIS — I10 Essential (primary) hypertension: Secondary | ICD-10-CM | POA: Diagnosis not present

## 2019-03-24 DIAGNOSIS — C7951 Secondary malignant neoplasm of bone: Secondary | ICD-10-CM | POA: Insufficient documentation

## 2019-03-24 DIAGNOSIS — E119 Type 2 diabetes mellitus without complications: Secondary | ICD-10-CM | POA: Diagnosis not present

## 2019-03-24 DIAGNOSIS — M5126 Other intervertebral disc displacement, lumbar region: Secondary | ICD-10-CM | POA: Diagnosis not present

## 2019-03-24 DIAGNOSIS — I700 Atherosclerosis of aorta: Secondary | ICD-10-CM | POA: Diagnosis not present

## 2019-03-24 DIAGNOSIS — K760 Fatty (change of) liver, not elsewhere classified: Secondary | ICD-10-CM | POA: Insufficient documentation

## 2019-03-24 DIAGNOSIS — I2510 Atherosclerotic heart disease of native coronary artery without angina pectoris: Secondary | ICD-10-CM | POA: Diagnosis not present

## 2019-03-24 NOTE — Progress Notes (Signed)
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  INTERVAL HISTORY: Patient returns to clinic today as an add-on for further discussion of his RFA scheduled for next week.  He continues to have significant back and right flank pain, but otherwise feels well.  He denies any other pain.  He has no neurologic complaints.  He denies any recent fevers or illnesses.  He has a good appetite and denies weight loss.  He has no chest pain, shortness of breath, cough, or hemoptysis.  He denies any nausea, vomiting, constipation, or diarrhea.  He has no urinary complaints.  Patient offers no further specific complaints today.  REVIEW OF SYSTEMS:   Review of Systems  Constitutional: Negative.  Negative for fever and malaise/fatigue.  Respiratory: Negative.  Negative for cough and shortness of breath.   Cardiovascular: Negative.  Negative for chest pain and leg swelling.  Gastrointestinal: Negative.  Negative for abdominal pain.  Genitourinary: Positive for flank pain.  Musculoskeletal: Positive for back pain.  Skin: Negative.  Negative for rash.  Neurological: Negative.  Negative for dizziness, focal weakness, weakness and headaches.  Psychiatric/Behavioral: Negative.  The patient is not nervous/anxious.     As per HPI. Otherwise, a complete review of systems is negative.  PAST MEDICAL HISTORY: Past Medical History:  Diagnosis Date  . Diabetes mellitus without complication ([place])   . Glaucoma   . Hyperlipidemia   . Hypertension     PAST SURGICAL HISTORY: Past Surgical History:  Procedure Laterality Date  . EYE SURGERY    . IR RADIOLOGIST EVAL & MGMT  03/21/2019    FAMILY HISTORY: Family History  Problem Relation Age of  Onset  . Heart attack Mother   . Diabetes Father     ADVANCED DIRECTIVES (Y/N):  N  HEALTH MAINTENANCE: Social History   Tobacco Use  . Smoking status: Former Smoker    Quit date: 02/08/1987    Years since quitting: 32.1  . Smokeless tobacco: Never Used  Substance Use Topics  . Alcohol use: No  . Drug use: No     Colonoscopy:  PAP:  Bone density:  Lipid panel:  No Known Allergies  Current Outpatient Medications  Medication Sig Dispense Refill  . acetaminophen (TYLENOL) 650 MG CR tablet Take 650 mg by mouth 2 (two) times daily.    . benzonatate (TESSALON) 200 MG capsule Take 1 capsule (200 mg total) by mouth 3 (three) times daily as needed for cough. 30 capsule 0  . Brinzolamide-Brimonidine (SIMBRINZA) 1-0.2 % SUSP Simbrinza 1 %-0.2 % eye drops,suspension    . dipyridamole-aspirin (AGGRENOX) 200-25 MG 12hr capsule Take 1 capsule by mouth 2 (two) times daily.    . dorzolamide (TRUSOPT) 2 % ophthalmic solution Place 1 drop into both eyes 2 (two) times daily.    . fluticasone (FLONASE) 50 MCG/ACT nasal spray Place 2 sprays into both nostrils daily. 16 g 0  . glipiZIDE (GLUCOTROL) 5 MG tablet Take 5 mg by mouth daily before breakfast.    . HYDROcodone-acetaminophen (NORCO) 5-325 MG tablet Take 1 tablet by mouth every 6 (six) hours as needed for moderate pain. Take 2 at night as needed. 90 tablet 0  . latanoprost (XALATAN) 0.005 % ophthalmic solution Place 1 drop into both eyes at bedtime.    Marland Kitchen  lisinopril (PRINIVIL,ZESTRIL) 5 MG tablet Take 5 mg by mouth daily as needed (hypertension).     . meclizine (ANTIVERT) 25 MG tablet Take 1 tablet (25 mg total) by mouth 3 (three) times daily as needed for dizziness. 30 tablet 0  . metFORMIN (GLUCOPHAGE) 500 MG tablet Take 500 mg by mouth 2 (two) times daily with a meal.    . simvastatin (ZOCOR) 20 MG tablet Take 20 mg by mouth at bedtime.     No current facility-administered medications for this visit.    OBJECTIVE: Vitals:    03/24/19 1036  BP: (!) 152/77  Pulse: 89  Resp: 18  Temp: 98.1 F (36.7 C)  SpO2: 98%     Body mass index is 36.96 kg/m.    ECOG FS:1 - Symptomatic but completely ambulatory  General: Well-developed, well-nourished, no acute distress. Eyes: Pink conjunctiva, anicteric sclera. HEENT: Normocephalic, moist mucous membranes. Lungs: No audible wheezing or coughing. Heart: Regular rate and rhythm. Abdomen: Soft, nontender, no obvious distention. Musculoskeletal: No edema, cyanosis, or clubbing. Neuro: Alert, answering all questions appropriately. Cranial nerves grossly intact. Skin: No rashes or petechiae noted. Psych: Normal affect.   LAB RESULTS:  Lab Results  Component Value Date   NA 133 (L) 02/15/2019   K 4.6 02/15/2019   CL 104 02/15/2019   CO2 24 02/15/2019   GLUCOSE 93 02/15/2019   BUN 10 02/15/2019   CREATININE 0.95 02/15/2019   CALCIUM 8.7 (L) 02/15/2019   PROT 7.8 02/15/2019   ALBUMIN 4.1 02/15/2019   AST 26 02/15/2019   ALT 22 02/15/2019   ALKPHOS 39 02/15/2019   BILITOT 0.9 02/15/2019   GFRNONAA >60 02/15/2019   GFRAA >60 02/15/2019    Lab Results  Component Value Date   WBC 6.5 02/15/2019   NEUTROABS 3.2 02/15/2019   HGB 14.1 02/15/2019   HCT 42.6 02/15/2019   MCV 94.2 02/15/2019   PLT 204 02/15/2019     STUDIES: CT Chest W Contrast  Result Date: 03/10/2019 CLINICAL DATA:  Abdominal CT demonstrating left pelvic adenopathy and a destructive lesion at T12. Abnormal bone scan. EXAM: CT CHEST WITH CONTRAST TECHNIQUE: Multidetector CT imaging of the chest was performed during intravenous contrast administration. CONTRAST:  56mL OMNIPAQUE IOHEXOL 300 MG/ML  SOLN COMPARISON:  Bone scan 03/03/2019. Abdominopelvic CT 02/15/2019. Chest radiograph 02/07/2017. FINDINGS: Cardiovascular: Aortic atherosclerosis. Tortuous thoracic aorta. Borderline cardiomegaly, without pericardial effusion. Multivessel coronary artery atherosclerosis. No central pulmonary embolism,  on this non-dedicated study. Pulmonary artery enlargement, outflow tract 3.6 cm Mediastinum/Nodes: No supraclavicular adenopathy. No mediastinal or hilar adenopathy. Lungs/Pleura: No pleural fluid. Moderate centrilobular and paraseptal emphysema. 3 mm left upper lobe pulmonary nodule on 53/3. A more inferior subpleural left upper lobe 5 mm nodule on 71/3. Mild left hemidiaphragm elevation. Subjacent left lower lobe mild atelectasis and volume loss. More cephalad left lower lobe scarring. Upper Abdomen: Hepatic steatosis. High right hepatic lobe cyst. Smaller right hepatic lobe lesions are less conspicuous today. Normal imaged portions of the spleen, stomach, pancreas, gallbladder, adrenal glands, kidneys. Abdominal aortic atherosclerosis. Musculoskeletal: Lytic lesion within the right-side of T12, including at 4.0 cm on 142/2. Mild secondary compression deformity. This extends into the right pedicle. IMPRESSION: 1. Lytic T12 osseous lesion, as before. 2. No evidence of soft tissue metastasis or primary malignancy within the chest. 3. Small pulmonary nodules, nonspecific. 4. Aortic atherosclerosis (ICD10-I70.0), coronary artery atherosclerosis and emphysema (ICD10-J43.9). 5. Hepatic steatosis. Electronically Signed   By: Abigail Miyamoto M.D.   On: 03/10/2019 15:56  MR THORACIC SPINE W WO CONTRAST  Result Date: 03/19/2019 CLINICAL DATA:  79 year old male with history of probable metastatic prostate cancer, T12 destructive lesion with compression fracture. EXAM: MRI THORACIC AND LUMBAR SPINE WITHOUT AND WITH CONTRAST TECHNIQUE: Multiplanar and multiecho pulse sequences of the thoracic and lumbar spine were obtained without and with intravenous contrast. CONTRAST:  75mL GADAVIST GADOBUTROL 1 MMOL/ML IV SOLN COMPARISON:  Comparison made with prior CT from 02/15/2019 as well as prior bone scan from 03/03/2019. FINDINGS: MRI THORACIC SPINE FINDINGS Alignment: Vertebral bodies normally aligned with preservation of the  normal thoracic kyphosis. No listhesis or subluxation. Vertebrae: Destructive metastatic lesion seen involving the T12 vertebral body, corresponding with findings seen on previous examinations. There is an associated pathologic compression fracture with mild 20% height loss. Mild convex bowing of the posterior margin of the vertebral body related to tumor without significant bony retropulsion. No significant stenosis. Lesion does partially extend into the right pedicle of T12 (series 21, image 5). No epidural or significant paraspinous extension. No other metastatic lesions seen elsewhere within the thoracic spine. Underlying bone marrow signal intensity diffusely heterogeneous. Note made of a 13 mm benign hemangioma within the T1 vertebral body. Adjacent T1 hypointensity without associated STIR correlate or enhancement noted (series 19, image 9), indeterminate, but most likely benign. No other discrete or worrisome osseous lesions. Vertebral body height otherwise maintained without acute or chronic fracture. No other abnormal marrow edema or enhancement. Cord: Signal intensity within the thoracic spinal cord is normal. Normal cord caliber and morphology. No epidural tumor or abnormal enhancement. Paraspinal and other soft tissues: Paraspinous soft tissues demonstrate no acute finding. Partially visualized lungs are grossly clear. Visualized visceral structures within normal limits. Disc levels: Mild for age scattered multilevel disc bulging seen within the mid and lower thoracic spine. Scattered multilevel facet hypertrophy noted as well, most notable at T9-10 through T11-12. No significant canal or foraminal stenosis. No neural impingement. MRI LUMBAR SPINE FINDINGS Segmentation: Transitional lumbosacral anatomy with partial sacralization of the L5 vertebral body. Alignment: Mild levoscoliosis. 3 mm chronic facet mediated anterolisthesis of L4 on L5. Alignment otherwise normal with preservation of the normal  lumbar lordosis. Vertebrae: Vertebral body height maintained without evidence for acute or chronic fracture. Bone marrow signal intensity diffusely heterogeneous. No discrete or worrisome osseous lesions. No abnormal marrow edema or enhancement. No other evidence for metastatic disease within the lumbar spine or visualized sacrum/pelvis. Conus medullaris: Extends to the L2 level and appears normal. No abnormal enhancement or epidural tumor. Paraspinal and other soft tissues: Paraspinous soft tissues demonstrate no acute finding. No paraspinous mass or abnormal enhancement. Enlarged 3.9 cm left external iliac node noted, corresponding with abnormality seen on prior CT. Remainder the visualized visceral structures otherwise unremarkable. Disc levels: L1-2: Normal interspace. Mild bilateral facet hypertrophy. No canal or foraminal stenosis. L2-3: Chronic intervertebral disc space narrowing with diffuse disc bulge and disc desiccation. Associated mild reactive endplate changes. Superimposed shallow left extraforaminal disc protrusion closely approximates the exiting left L2 nerve root as it courses of the left neural foramen (series 4, image 20). Mild to moderate facet hypertrophy. No significant spinal stenosis. Foramina remain patent. L3-4: Chronic intervertebral disc space narrowing with diffuse disc bulge and disc desiccation. Superimposed left foraminal disc protrusion seen at the inferior aspect of the left neural foramen (series 4, image 26). Moderate facet and ligament flavum hypertrophy. Resultant mild spinal stenosis. Foramina remain patent. L4-5: Trace anterolisthesis. Chronic intervertebral disc space narrowing with diffuse disc bulge and disc  desiccation. Reactive endplate changes with marginal endplate osteophytic spurring. Moderate to severe right worse than left facet hypertrophy with ligament flavum thickening. Resultant mild canal with bilateral lateral recess stenosis. Mild right worse than left L4  foraminal narrowing. 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 Mild disc bulge with reactive endplate changes. Moderate left greater than right facet hypertrophy. Resultant mild left greater than right lateral recess stenosis. Moderate bilateral L5 foraminal narrowing, right greater than left. IMPRESSION: 1. 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 No significant bony retropulsion or stenosis. No significant epidural or paraspinous tumor at this time. 2. No other evidence for osseous metastatic disease within the thoracic or lumbar spine. 3. 3.9 cm left external iliac node, consistent with nodal metastasis. 4. Small left foraminal to extraforaminal disc protrusions at L2-3 and L3-4, closely approximating and potentially irritating either of the exiting left L2 or L3 nerve roots respectively. 5. Multifactorial degenerative changes at L4-5 and L5-S1 with resultant mild to moderate bilateral neural foraminal narrowing, with mild spinal stenosis at the L4-5 level. Electronically Signed   By: Jeannine Boga M.D.   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 EXAM: MRI THORACIC AND LUMBAR SPINE WITHOUT AND WITH CONTRAST TECHNIQUE: Multiplanar and multiecho pulse sequences of the thoracic and lumbar spine were obtained without and with intravenous contrast. CONTRAST:  56mL GADAVIST GADOBUTROL 1 MMOL/ML IV SOLN COMPARISON:  Comparison made with prior CT from 02/15/2019 as well as prior bone scan from 03/03/2019. FINDINGS: MRI THORACIC SPINE FINDINGS Alignment: Vertebral bodies normally aligned with preservation of the normal thoracic kyphosis. No listhesis or subluxation. Vertebrae: Destructive metastatic lesion seen involving the  T12 vertebral body, corresponding with findings seen on previous examinations. There is an associated pathologic compression fracture with mild 20% height loss. Mild convex bowing of the posterior margin of the vertebral body related to tumor without significant bony retropulsion. No significant stenosis. Lesion does partially extend into the right pedicle of T12 (series 21, image 5). No epidural or significant paraspinous extension. No other metastatic lesions seen elsewhere within the thoracic spine. Underlying bone marrow signal intensity diffusely heterogeneous. Note made of a 13 mm benign hemangioma within the T1 vertebral body. Adjacent T1 hypointensity without associated STIR correlate or enhancement noted (series 19, image 9), indeterminate, but most likely benign. No other discrete or worrisome osseous lesions. Vertebral body height otherwise maintained without acute or chronic fracture. No other abnormal marrow edema or enhancement. Cord: Signal intensity within the thoracic spinal cord is normal. Normal cord caliber and morphology. No epidural tumor or abnormal enhancement. Paraspinal and other soft tissues: Paraspinous soft tissues demonstrate no acute finding. Partially visualized lungs are grossly clear. Visualized visceral structures within normal limits. Disc levels: Mild for age scattered multilevel disc bulging seen within the mid and lower thoracic spine. Scattered multilevel facet hypertrophy noted as well, most notable at T9-10 through T11-12. No significant canal or foraminal stenosis. No neural impingement. MRI LUMBAR SPINE FINDINGS Segmentation: Transitional lumbosacral anatomy with partial sacralization of the L5 vertebral body. Alignment: Mild levoscoliosis. 3 mm chronic facet mediated anterolisthesis of L4 on L5. Alignment otherwise normal with preservation of the normal lumbar lordosis. Vertebrae: Vertebral body height maintained without evidence for acute or chronic fracture. Bone marrow  signal intensity diffusely heterogeneous. No discrete  or worrisome osseous lesions. No abnormal marrow edema or enhancement. No other evidence for metastatic disease within the lumbar spine or visualized sacrum/pelvis. Conus medullaris: Extends to the L2 level and appears normal. No abnormal enhancement or epidural tumor. Paraspinal and other soft tissues: Paraspinous soft tissues demonstrate no acute finding. No paraspinous mass or abnormal enhancement. Enlarged 3.9 cm left external iliac node noted, corresponding with abnormality seen on prior CT. Remainder the visualized visceral structures otherwise unremarkable. Disc levels: L1-2: Normal interspace. Mild bilateral facet hypertrophy. No canal or foraminal stenosis. L2-3: Chronic intervertebral disc space narrowing with diffuse disc bulge and disc desiccation. Associated mild reactive endplate changes. Superimposed shallow left extraforaminal disc protrusion closely approximates the exiting left L2 nerve root as it courses of the left neural foramen (series 4, image 20). Mild to moderate facet hypertrophy. No significant spinal stenosis. Foramina remain patent. L3-4: Chronic intervertebral disc space narrowing with diffuse disc bulge and disc desiccation. Superimposed left foraminal disc protrusion seen at the inferior aspect of the left neural foramen (series 4, image 26). Moderate facet and ligament flavum hypertrophy. Resultant mild spinal stenosis. Foramina remain patent. L4-5: Trace anterolisthesis. Chronic intervertebral disc space narrowing with diffuse disc bulge and disc desiccation. Reactive endplate changes with marginal endplate osteophytic spurring. Moderate to severe right worse than left facet hypertrophy with ligament flavum thickening. Resultant mild canal with bilateral lateral recess stenosis. Mild right worse than left L4 foraminal narrowing. 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 Mild disc bulge with  reactive endplate changes. Moderate left greater than right facet hypertrophy. Resultant mild left greater than right lateral recess stenosis. Moderate bilateral L5 foraminal narrowing, right greater than left. IMPRESSION: 1. 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 No significant bony retropulsion or stenosis. No significant epidural or paraspinous tumor at this time. 2. No other evidence for osseous metastatic disease within the thoracic or lumbar spine. 3. 3.9 cm left external iliac node, consistent with nodal metastasis. 4. Small left foraminal to extraforaminal disc protrusions at L2-3 and L3-4, closely approximating and potentially irritating either of the exiting left L2 or L3 nerve roots respectively. 5. Multifactorial degenerative changes at L4-5 and L5-S1 with resultant mild to moderate bilateral neural foraminal narrowing, with mild spinal stenosis at the L4-5 level. Electronically Signed   By: Jeannine Boga M.D.   On: 03/19/2019 19:12   NM Bone Scan Whole Body  Result Date: 03/03/2019 CLINICAL DATA:  Bone lesion and lymphadenopathy in the pelvis. EXAM: NUCLEAR MEDICINE WHOLE BODY BONE SCAN TECHNIQUE: Whole body anterior and posterior images were obtained approximately 3 hours after intravenous injection of radiopharmaceutical. RADIOPHARMACEUTICALS:  22.7 mCi Technetium-49m MDP IV COMPARISON:  CT 02/15/2019 FINDINGS: Focal uptake in the L1 vertebral body corresponds to lytic lesion on comparison CT and consistent with bone metastasis. Small focus of uptake within the RIGHT scapula body. No additional foci radiotracer uptake to suggest skeletal metastasis. 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 Indeterminate lesion in the RIGHT scapula body. Electronically Signed   By: Suzy Bouchard M.D.   On: 03/03/2019 16:10   IR Radiologist Eval & Mgmt  Result Date: 03/21/2019 Please refer to notes tab  for details about interventional procedure. 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  PLAN:    1. Lymphadenopathy, T12 bony lesion: CT scan and bone scan results reviewed independently and reported as above.  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  Appreciate interventional radiology input.  Plan is to have biopsy and RFA ablation next week.  Once diagnosis is confirmed, patient likely will benefit from Lupron as well.  Return to clinic 1 to 2 weeks after his procedure to discuss the results and initiation of treatment.   2.  Pain: Continue to Vicodin nightly as needed.  RFA ablation as above.  I spent a total of 30 minutes reviewing chart data, face-to-face evaluation with the patient, counseling and coordination of care as detailed above.  Patient expressed understanding and was in agreement with this plan. He also understands that He can call clinic at any time with any questions, concerns, or complaints.   Cancer Staging No matching staging information was found for the patient.  Lloyd Huger, MD   03/24/2019 2:54 PM

## 2019-03-27 ENCOUNTER — Other Ambulatory Visit: Payer: Self-pay | Admitting: Radiology

## 2019-03-28 ENCOUNTER — Other Ambulatory Visit (HOSPITAL_COMMUNITY): Payer: Self-pay | Admitting: Interventional Radiology

## 2019-03-28 ENCOUNTER — Other Ambulatory Visit: Payer: Self-pay

## 2019-03-28 ENCOUNTER — Ambulatory Visit (HOSPITAL_COMMUNITY)
Admission: RE | Admit: 2019-03-28 | Discharge: 2019-03-28 | Disposition: A | Discharge status: Home / Self Care | Payer: Medicare Other | Source: Ambulatory Visit | Attending: Interventional Radiology | Admitting: Interventional Radiology

## 2019-03-28 DIAGNOSIS — C801 Malignant (primary) neoplasm, unspecified: Secondary | ICD-10-CM | POA: Diagnosis not present

## 2019-03-28 DIAGNOSIS — E119 Type 2 diabetes mellitus without complications: Secondary | ICD-10-CM | POA: Diagnosis not present

## 2019-03-28 DIAGNOSIS — E785 Hyperlipidemia, unspecified: Secondary | ICD-10-CM | POA: Insufficient documentation

## 2019-03-28 DIAGNOSIS — Z79899 Other long term (current) drug therapy: Secondary | ICD-10-CM | POA: Diagnosis not present

## 2019-03-28 DIAGNOSIS — S22080A Wedge compression fracture of T11-T12 vertebra, initial encounter for closed fracture: Secondary | ICD-10-CM | POA: Diagnosis present

## 2019-03-28 DIAGNOSIS — Z8249 Family history of ischemic heart disease and other diseases of the circulatory system: Secondary | ICD-10-CM | POA: Diagnosis not present

## 2019-03-28 DIAGNOSIS — Z7984 Long term (current) use of oral hypoglycemic drugs: Secondary | ICD-10-CM | POA: Diagnosis not present

## 2019-03-28 DIAGNOSIS — C7982 Secondary malignant neoplasm of genital organs: Secondary | ICD-10-CM | POA: Diagnosis not present

## 2019-03-28 DIAGNOSIS — Z833 Family history of diabetes mellitus: Secondary | ICD-10-CM | POA: Diagnosis not present

## 2019-03-28 DIAGNOSIS — H409 Unspecified glaucoma: Secondary | ICD-10-CM | POA: Diagnosis not present

## 2019-03-28 DIAGNOSIS — Z87891 Personal history of nicotine dependence: Secondary | ICD-10-CM | POA: Insufficient documentation

## 2019-03-28 DIAGNOSIS — I10 Essential (primary) hypertension: Secondary | ICD-10-CM | POA: Insufficient documentation

## 2019-03-28 HISTORY — PX: IR KYPHO THORACIC WITH BONE BIOPSY: IMG5518

## 2019-03-28 HISTORY — PX: IR BONE TUMOR(S)RF ABLATION: IMG2284

## 2019-03-28 LAB — CBC
HCT: 43.8 % (ref 39.0–52.0)
Hemoglobin: 14.3 g/dL (ref 13.0–17.0)
MCH: 31.3 pg (ref 26.0–34.0)
MCHC: 32.6 g/dL (ref 30.0–36.0)
MCV: 95.8 fL (ref 80.0–100.0)
Platelets: 202 10*3/uL (ref 150–400)
RBC: 4.57 MIL/uL (ref 4.22–5.81)
RDW: 13.8 % (ref 11.5–15.5)
WBC: 6.4 10*3/uL (ref 4.0–10.5)
nRBC: 0 % (ref 0.0–0.2)

## 2019-03-28 LAB — GLUCOSE, CAPILLARY: Glucose-Capillary: 100 mg/dL — ABNORMAL HIGH (ref 70–99)

## 2019-03-28 LAB — BASIC METABOLIC PANEL
Anion gap: 10 (ref 5–15)
BUN: 10 mg/dL (ref 8–23)
CO2: 26 mmol/L (ref 22–32)
Calcium: 9.1 mg/dL (ref 8.9–10.3)
Chloride: 105 mmol/L (ref 98–111)
Creatinine, Ser: 0.95 mg/dL (ref 0.61–1.24)
GFR calc Af Amer: >60 mL/min (ref >60)
GFR calc non Af Amer: >60 mL/min (ref >60)
Glucose, Bld: 115 mg/dL — ABNORMAL HIGH (ref 70–99)
Potassium: 4.4 mmol/L (ref 3.5–5.1)
Sodium: 141 mmol/L (ref 135–145)

## 2019-03-28 LAB — PROTIME-INR
INR: 1 (ref 0.8–1.2)
Prothrombin Time: 13.4 seconds (ref 11.4–15.2)

## 2019-03-28 MED ORDER — MIDAZOLAM HCL 2 MG/2ML IJ SOLN
INTRAMUSCULAR | Status: AC
  Filled 2019-03-28: qty 2

## 2019-03-28 MED ORDER — KETOROLAC TROMETHAMINE 60 MG/2ML IM SOLN
INTRAMUSCULAR | Status: DC | PRN
  Administered 2019-03-28: 30 mg

## 2019-03-28 MED ORDER — SODIUM CHLORIDE 0.9 % IV SOLN: INTRAVENOUS | Status: DC

## 2019-03-28 MED ORDER — CEFAZOLIN SODIUM-DEXTROSE 2-4 GM/100ML-% IV SOLN
INTRAVENOUS | Status: AC
  Administered 2019-03-28: 2000 mg
  Filled 2019-03-28: qty 100

## 2019-03-28 MED ORDER — FENTANYL CITRATE (PF) 100 MCG/2ML IJ SOLN
INTRAMUSCULAR | Status: AC
  Filled 2019-03-28: qty 2

## 2019-03-28 MED ORDER — MIDAZOLAM HCL 2 MG/2ML IJ SOLN
INTRAMUSCULAR | Status: DC | PRN
  Administered 2019-03-28: 1 mg via INTRAVENOUS
  Administered 2019-03-28: 0.5 mg via INTRAVENOUS
  Administered 2019-03-28: 1 mg via INTRAVENOUS
  Administered 2019-03-28 (×2): 0.5 mg via INTRAVENOUS

## 2019-03-28 MED ORDER — DEXTROSE 5 % IV SOLN
3.0000 g | INTRAVENOUS | Status: DC
  Filled 2019-03-28: qty 3000

## 2019-03-28 MED ORDER — FENTANYL CITRATE (PF) 100 MCG/2ML IJ SOLN
INTRAMUSCULAR | Status: DC | PRN
  Administered 2019-03-28: 25 ug via INTRAVENOUS
  Administered 2019-03-28: 50 ug via INTRAVENOUS
  Administered 2019-03-28 (×2): 25 ug via INTRAVENOUS
  Administered 2019-03-28: 50 ug via INTRAVENOUS

## 2019-03-28 MED ORDER — LIDOCAINE HCL 1 % IJ SOLN
INTRAMUSCULAR | Status: AC
  Filled 2019-03-28: qty 20

## 2019-03-28 MED ORDER — KETOROLAC TROMETHAMINE 30 MG/ML IJ SOLN
INTRAMUSCULAR | Status: AC
  Filled 2019-03-28: qty 1

## 2019-03-28 MED ORDER — LIDOCAINE HCL (PF) 1 % IJ SOLN
INTRAMUSCULAR | Status: DC | PRN
  Administered 2019-03-28: 20 mL

## 2019-03-28 NOTE — Procedures (Signed)
Interventional Radiology Procedure Note  Procedure: T12 osteocool and KP for malignancy.  Bx performed.  .  Complications: None  Recommendations:  - follow up path - Do not submerge for 7 days - Routine wound care  - ice prn - 3 hrs supine before dc home - follow up with Dr. Earleen Newport in 4-6 weeks at [facility] clinic  Signed,  Evan Gonzalez. Earleen Newport, DO

## 2019-03-28 NOTE — Sedation Documentation (Signed)
Procedure started. Pt is awake without complaints at this time. VSS. Will continue to monitor

## 2019-03-28 NOTE — Sedation Documentation (Signed)
Patient is resting comfortably. 

## 2019-03-28 NOTE — Discharge Instructions (Signed)
Wound Care, Adult Taking care of your wound properly can help to prevent pain, infection, and scarring. It can also help your wound to heal more quickly. How to care for your wound Wound care      Follow instructions from your health care provider about how to take care of your wound. Make sure you: ? Wash your hands with soap and water before you change the bandage (dressing). If soap and water are not available, use hand sanitizer. ? Remove your dressing as told by your health care provider. In 24-48 hours  Check your wound area every day for signs of infection. Check for: ? Redness, swelling, or pain. ? Fluid or blood. ? Warmth. ? Pus or a bad smell.  Ask your health care provider if you should clean the wound with mild soap and water. Doing this may include: ? Using a clean towel to pat the wound dry after cleaning it. Do not rub or scrub the wound.  Keep the dressing dry until your health care provider says it can be removed. Do not take baths, swim, use a hot tub, or do anything that would put the wound underwater until your health care provider approves. Ask your health care provider if you can take showers. You may only be allowed to take sponge baths. No baths for 7 days, you may shower after you remove the dressing. General instructions  Return to your normal activities as told by your health care provider. Ask your health care provider what activities are safe.  Do not scratch or pick at the wound.  Do not use any products that contain nicotine or tobacco, such as cigarettes and e-cigarettes. These may delay wound healing. If you need help quitting, ask your health care provider.  Keep all follow-up visits as told by your health care provider. This is important.  Eat a diet that includes protein, vitamin A, vitamin C, and other nutrient-rich foods to help the wound heal. ? Foods rich in protein include meat, dairy, beans, nuts, and other sources. ? Foods rich in vitamin  A include carrots and dark green, leafy vegetables. ? Foods rich in vitamin C include citrus, tomatoes, and other fruits and vegetables. ? Nutrient-rich foods have protein, carbohydrates, fat, vitamins, or minerals. Eat a variety of healthy foods including vegetables, fruits, and whole grains. Contact a health care provider if:  Your pain is not controlled with medicine.  You have redness, swelling, or pain around the wound.  You have fluid or blood coming from the wound.  Your wound feels warm to the touch.  You have pus or a bad smell coming from the wound.  You have a fever or chills.  You are nauseous or you vomit.  You are dizzy. Get help right away if:  You have a red streak going away from your wound.  The edges of the wound open up and separate.  Your wound is bleeding, and the bleeding does not stop with gentle pressure.  You have a rash.  You faint.  You have trouble breathing. Summary  Wash your hands with soap and water before removing your bandage (dressing).  To help with healing, eat foods that are rich in protein, vitamin A, vitamin C, and other nutrients.  Check your wound every day for signs of infection. Contact your health care provider if you suspect that your wound is infected. This information is not intended to replace advice given to you by your health care provider. Make sure you  discuss any questions you have with your health care provider. Document Revised: 05/23/2018 Document Reviewed: 08/20/2015 Elsevier Patient Education  [place].  Moderate Conscious Sedation, Adult, Care After These instructions provide you with information about caring for yourself after your procedure. Your health care provider may also give you more specific instructions. Your treatment has been planned according to current medical practices, but problems sometimes occur. Call your health care provider if you have any problems or questions after your  procedure. What can I expect after the procedure? After your procedure, it is common:  To feel sleepy for several hours.  To feel clumsy and have poor balance for several hours.  To have poor judgment for several hours.  To vomit if you eat too soon. Follow these instructions at home: For at least 24 hours after the procedure:   Do not: ? Participate in activities where you could fall or become injured. ? Drive. ? Use heavy machinery. ? Drink alcohol. ? Take sleeping pills or medicines that cause drowsiness. ? Make important decisions or sign legal documents. ? Take care of children on your own.  Rest. Eating and drinking  Follow the diet recommended by your health care provider.  If you vomit: ? Drink water, juice, or soup when you can drink without vomiting. ? Make sure you have little or no nausea before eating solid foods. General instructions  Have a responsible adult stay with you until you are awake and alert.  Take over-the-counter and prescription medicines only as told by your health care provider.  If you smoke, do not smoke without supervision.  Keep all follow-up visits as told by your health care provider. This is important. Contact a health care provider if:  You keep feeling nauseous or you keep vomiting.  You feel light-headed.  You develop a rash.  You have a fever. Get help right away if:  You have trouble breathing. This information is not intended to replace advice given to you by your health care provider. Make sure you discuss any questions you have with your health care provider. Document Revised: 01/15/2017 Document Reviewed: 05/25/2015 Elsevier Patient Education  2020 [employer].

## 2019-03-28 NOTE — Sedation Documentation (Signed)
Vital signs stable. 

## 2019-03-28 NOTE — H&P (Signed)
Chief Complaint: Patient was seen in consultation today for T12 pathologic fracture.  Referring Physician(s): Dr. Grayland Ormond  Supervising Physician: Corrie Mckusick  Patient Status: [facility] - Out-pt  History of Present Illness: Evan Gonzalez is a 79 y.o. male with a past medical history significant for glaucoma, HLD, HTN, DM and possible metastatic prostate cancer followed by Dr. Grayland Ormond who presents today for a bone biopsy, vertebral augmentation and RFA ablation of T12 pathologic fracture. Evan Gonzalez was seen in consultation by Dr. Earleen Newport on 03/21/19 - please this note for complete details. Briefly, Evan Gonzalez began to have severe lower back pain late last year and he presented to the ED on 02/15/19 where imaging noted pathologic replacement of the T12 vertebral body. 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 He was referred to our service for bone biopsy to confirm this diagnosis as well as vertebral augmentation with RFA ablation for which he presents today.   Evan Gonzalez states he has been feeling well overall, his back pain has improved after recently being prescribed pain medication from Dr. Grayland Ormond. He states that his back pain is a 10/10 without medication and a 3-4/10 with pain medication. He has not had any trouble walking and does not use an assistance device at home. He denies any numbness, tingling, weakness or change in bowel/bladder function. He states that he had one episode of painless bright red blood in his urine. He states understanding of the procedure today and wishes to proceed as planned.  Past Medical History:  Diagnosis Date  . Diabetes mellitus without complication ([place])   . Glaucoma   . Hyperlipidemia   . Hypertension     Past Surgical History:  Procedure Laterality Date  . EYE SURGERY    . IR RADIOLOGIST EVAL & MGMT  03/21/2019    Allergies: Patient has no known  allergies.  Medications: Prior to Admission medications   Medication Sig Start Date End Date Taking? Authorizing Provider  acetaminophen (TYLENOL) 650 MG CR tablet Take 650 mg by mouth 2 (two) times daily.    [provider]  benzonatate (TESSALON) 200 MG capsule Take 1 capsule (200 mg total) by mouth 3 (three) times daily as needed for cough. 02/07/17   Melynda Ripple, MD  Brinzolamide-Brimonidine [facility]) 1-0.2 % SUSP Simbrinza 1 %-0.2 % eye drops,suspension    [provider]  dipyridamole-aspirin (AGGRENOX) 200-25 MG 12hr capsule Take 1 capsule by mouth 2 (two) times daily.    [provider]  dorzolamide (TRUSOPT) 2 % ophthalmic solution Place 1 drop into both eyes 2 (two) times daily.    [provider]  fluticasone (FLONASE) 50 MCG/ACT nasal spray Place 2 sprays into both nostrils daily. 02/07/17   Melynda Ripple, MD  glipiZIDE (GLUCOTROL) 5 MG tablet Take 5 mg by mouth daily before breakfast.    [provider]  HYDROcodone-acetaminophen (NORCO) 5-325 MG tablet Take 1 tablet by mouth every 6 (six) hours as needed for moderate pain. Take 2 at night as needed. 03/13/19   Lloyd Huger, MD  latanoprost (XALATAN) 0.005 % ophthalmic solution Place 1 drop into both eyes at bedtime.    [provider]  lisinopril (PRINIVIL,ZESTRIL) 5 MG tablet Take 5 mg by mouth daily as needed (hypertension).     [provider]  meclizine (ANTIVERT) 25 MG tablet Take 1 tablet (25 mg total) by mouth 3 (three) times daily as needed  for dizziness. 09/24/17   Harvest Dark, MD  metFORMIN (GLUCOPHAGE) 500 MG tablet Take 500 mg by mouth 2 (two) times daily with a meal.    [provider]  simvastatin (ZOCOR) 20 MG tablet Take 20 mg by mouth at bedtime.    [provider]     Family History  Problem Relation Age of Onset  . Heart attack Mother   . Diabetes Father     Social History   Socioeconomic History  .  Marital status: Single    Spouse name: Not on file  . Number of children: Not on file  . Years of education: Not on file  . Highest education level: Not on file  Occupational History  . Not on file  Tobacco Use  . Smoking status: Former Smoker    Quit date: 02/08/1987    Years since quitting: 32.1  . Smokeless tobacco: Never Used  Substance and Sexual Activity  . Alcohol use: No  . Drug use: No  . Sexual activity: Not on file  Other Topics Concern  . Not on file  Social History Narrative  . Not on file   Social Determinants of Health   Financial Resource Strain:   . Difficulty of Paying Living Expenses: Not on file  Food Insecurity:   . Worried About Charity fundraiser in the Last Year: Not on file  . Ran Out of Food in the Last Year: Not on file  Transportation Needs:   . Lack of Transportation (Medical): Not on file  . Lack of Transportation (Non-Medical): Not on file  Physical Activity:   . Days of Exercise per Week: Not on file  . Minutes of Exercise per Session: Not on file  Stress:   . Feeling of Stress : Not on file  Social Connections:   . Frequency of Communication with Friends and Family: Not on file  . Frequency of Social Gatherings with Friends and Family: Not on file  . Attends Religious Services: Not on file  . Active Member of Clubs or Organizations: Not on file  . Attends Archivist Meetings: Not on file  . Marital Status: Not on file     Review of Systems: A 12 point ROS discussed and pertinent positives are indicated in the HPI above.  All other systems are negative.  Review of Systems  Constitutional: Negative for appetite change, chills, fatigue and fever.  HENT: Negative for nosebleeds.   Respiratory: Negative for cough and shortness of breath.   Cardiovascular: Negative for chest pain.  Gastrointestinal: Negative for abdominal pain, blood in stool, constipation, diarrhea, nausea and vomiting.  Genitourinary: Negative for dysuria  and hematuria.  Musculoskeletal: Positive for back pain. Negative for gait problem, neck pain and neck stiffness.  Skin: Negative for rash and wound.  Neurological: Negative for dizziness, facial asymmetry, speech difficulty, weakness, light-headedness, numbness and headaches.    Vital Signs: BP 131/83   Pulse 78   Temp 98.2 F (36.8 C) (Oral)   Resp (!) 22   Ht 5\' 11"  (1.803 m)   Wt 265 lb (120.2 kg)   SpO2 95%   BMI 36.96 kg/m   Physical Exam Vitals reviewed.  Constitutional:      General: He is not in acute distress.    Appearance: He is not ill-appearing.  HENT:     Head: Normocephalic.     Mouth/Throat:     Mouth: Mucous membranes are moist.     Pharynx: Oropharynx is clear.  No oropharyngeal exudate or posterior oropharyngeal erythema.     Comments: (+) edentulous Cardiovascular:     Rate and Rhythm: Normal rate and regular rhythm.  Pulmonary:     Effort: Pulmonary effort is normal.     Breath sounds: Normal breath sounds.  Abdominal:     General: There is no distension.     Palpations: Abdomen is soft.     Tenderness: There is no abdominal tenderness.  Skin:    General: Skin is warm and dry.  Neurological:     Mental Status: He is alert and oriented to person, place, and time.  Psychiatric:        Mood and Affect: Mood normal.        Behavior: Behavior normal.        Thought Content: Thought content normal.        Judgment: Judgment normal.          Imaging: CT Chest W Contrast  Result Date: 03/10/2019 CLINICAL DATA:  Abdominal CT demonstrating left pelvic adenopathy and a destructive lesion at T12. Abnormal bone scan. EXAM: CT CHEST WITH CONTRAST TECHNIQUE: Multidetector CT imaging of the chest was performed during intravenous contrast administration. CONTRAST:  42mL OMNIPAQUE IOHEXOL 300 MG/ML  SOLN COMPARISON:  Bone scan 03/03/2019. Abdominopelvic CT 02/15/2019. Chest radiograph 02/07/2017. FINDINGS: Cardiovascular: Aortic atherosclerosis. Tortuous  thoracic aorta. Borderline cardiomegaly, without pericardial effusion. Multivessel coronary artery atherosclerosis. No central pulmonary embolism, on this non-dedicated study. Pulmonary artery enlargement, outflow tract 3.6 cm Mediastinum/Nodes: No supraclavicular adenopathy. No mediastinal or hilar adenopathy. Lungs/Pleura: No pleural fluid. Moderate centrilobular and paraseptal emphysema. 3 mm left upper lobe pulmonary nodule on 53/3. A more inferior subpleural left upper lobe 5 mm nodule on 71/3. Mild left hemidiaphragm elevation. Subjacent left lower lobe mild atelectasis and volume loss. More cephalad left lower lobe scarring. Upper Abdomen: Hepatic steatosis. High right hepatic lobe cyst. Smaller right hepatic lobe lesions are less conspicuous today. Normal imaged portions of the spleen, stomach, pancreas, gallbladder, adrenal glands, kidneys. Abdominal aortic atherosclerosis. Musculoskeletal: Lytic lesion within the right-side of T12, including at 4.0 cm on 142/2. Mild secondary compression deformity. This extends into the right pedicle. IMPRESSION: 1. Lytic T12 osseous lesion, as before. 2. No evidence of soft tissue metastasis or primary malignancy within the chest. 3. Small pulmonary nodules, nonspecific. 4. Aortic atherosclerosis (ICD10-I70.0), coronary artery atherosclerosis and emphysema (ICD10-J43.9). 5. Hepatic steatosis. Electronically Signed   By: Abigail Miyamoto M.D.   On: 03/10/2019 15:56   MR THORACIC SPINE W WO CONTRAST  Result Date: 03/19/2019 CLINICAL DATA:  79 year old male with history of probable metastatic prostate cancer, T12 destructive lesion with compression fracture. EXAM: MRI THORACIC AND LUMBAR SPINE WITHOUT AND WITH CONTRAST TECHNIQUE: Multiplanar and multiecho pulse sequences of the thoracic and lumbar spine were obtained without and with intravenous contrast. CONTRAST:  75mL GADAVIST GADOBUTROL 1 MMOL/ML IV SOLN COMPARISON:  Comparison made with prior CT from 02/15/2019 as well  as prior bone scan from 03/03/2019. FINDINGS: MRI THORACIC SPINE FINDINGS Alignment: Vertebral bodies normally aligned with preservation of the normal thoracic kyphosis. No listhesis or subluxation. Vertebrae: Destructive metastatic lesion seen involving the T12 vertebral body, corresponding with findings seen on previous examinations. There is an associated pathologic compression fracture with mild 20% height loss. Mild convex bowing of the posterior margin of the vertebral body related to tumor without significant bony retropulsion. No significant stenosis. Lesion does partially extend into the right pedicle of T12 (series 21, image 5). No epidural or significant  paraspinous extension. No other metastatic lesions seen elsewhere within the thoracic spine. Underlying bone marrow signal intensity diffusely heterogeneous. Note made of a 13 mm benign hemangioma within the T1 vertebral body. Adjacent T1 hypointensity without associated STIR correlate or enhancement noted (series 19, image 9), indeterminate, but most likely benign. No other discrete or worrisome osseous lesions. Vertebral body height otherwise maintained without acute or chronic fracture. No other abnormal marrow edema or enhancement. Cord: Signal intensity within the thoracic spinal cord is normal. Normal cord caliber and morphology. No epidural tumor or abnormal enhancement. Paraspinal and other soft tissues: Paraspinous soft tissues demonstrate no acute finding. Partially visualized lungs are grossly clear. Visualized visceral structures within normal limits. Disc levels: Mild for age scattered multilevel disc bulging seen within the mid and lower thoracic spine. Scattered multilevel facet hypertrophy noted as well, most notable at T9-10 through T11-12. No significant canal or foraminal stenosis. No neural impingement. MRI LUMBAR SPINE FINDINGS Segmentation: Transitional lumbosacral anatomy with partial sacralization of the L5 vertebral body.  Alignment: Mild levoscoliosis. 3 mm chronic facet mediated anterolisthesis of L4 on L5. Alignment otherwise normal with preservation of the normal lumbar lordosis. Vertebrae: Vertebral body height maintained without evidence for acute or chronic fracture. Bone marrow signal intensity diffusely heterogeneous. No discrete or worrisome osseous lesions. No abnormal marrow edema or enhancement. No other evidence for metastatic disease within the lumbar spine or visualized sacrum/pelvis. Conus medullaris: Extends to the L2 level and appears normal. No abnormal enhancement or epidural tumor. Paraspinal and other soft tissues: Paraspinous soft tissues demonstrate no acute finding. No paraspinous mass or abnormal enhancement. Enlarged 3.9 cm left external iliac node noted, corresponding with abnormality seen on prior CT. Remainder the visualized visceral structures otherwise unremarkable. Disc levels: L1-2: Normal interspace. Mild bilateral facet hypertrophy. No canal or foraminal stenosis. L2-3: Chronic intervertebral disc space narrowing with diffuse disc bulge and disc desiccation. Associated mild reactive endplate changes. Superimposed shallow left extraforaminal disc protrusion closely approximates the exiting left L2 nerve root as it courses of the left neural foramen (series 4, image 20). Mild to moderate facet hypertrophy. No significant spinal stenosis. Foramina remain patent. L3-4: Chronic intervertebral disc space narrowing with diffuse disc bulge and disc desiccation. Superimposed left foraminal disc protrusion seen at the inferior aspect of the left neural foramen (series 4, image 26). Moderate facet and ligament flavum hypertrophy. Resultant mild spinal stenosis. Foramina remain patent. L4-5: Trace anterolisthesis. Chronic intervertebral disc space narrowing with diffuse disc bulge and disc desiccation. Reactive endplate changes with marginal endplate osteophytic spurring. Moderate to severe right worse than  left facet hypertrophy with ligament flavum thickening. Resultant mild canal with bilateral lateral recess stenosis. Mild right worse than left L4 foraminal narrowing. 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 Mild disc bulge with reactive endplate changes. Moderate left greater than right facet hypertrophy. Resultant mild left greater than right lateral recess stenosis. Moderate bilateral L5 foraminal narrowing, right greater than left. IMPRESSION: 1. 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 No significant bony retropulsion or stenosis. No significant epidural or paraspinous tumor at this time. 2. No other evidence for osseous metastatic disease within the thoracic or lumbar spine. 3. 3.9 cm left external iliac node, consistent with nodal metastasis. 4. Small left foraminal to extraforaminal disc protrusions at L2-3 and L3-4, closely approximating and potentially irritating either of the exiting left L2 or L3 nerve roots respectively. 5. Multifactorial degenerative changes at L4-5  and L5-S1 with resultant mild to moderate bilateral neural foraminal narrowing, with mild spinal stenosis at the L4-5 level. Electronically Signed   By: Jeannine Boga M.D.   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 EXAM: MRI THORACIC AND LUMBAR SPINE WITHOUT AND WITH CONTRAST TECHNIQUE: Multiplanar and multiecho pulse sequences of the thoracic and lumbar spine were obtained without and with intravenous contrast. CONTRAST:  11mL GADAVIST GADOBUTROL 1 MMOL/ML IV SOLN COMPARISON:  Comparison made with prior CT from 02/15/2019 as well as prior bone scan from 03/03/2019. FINDINGS: MRI THORACIC SPINE FINDINGS Alignment: Vertebral bodies normally  aligned with preservation of the normal thoracic kyphosis. No listhesis or subluxation. Vertebrae: Destructive metastatic lesion seen involving the T12 vertebral body, corresponding with findings seen on previous examinations. There is an associated pathologic compression fracture with mild 20% height loss. Mild convex bowing of the posterior margin of the vertebral body related to tumor without significant bony retropulsion. No significant stenosis. Lesion does partially extend into the right pedicle of T12 (series 21, image 5). No epidural or significant paraspinous extension. No other metastatic lesions seen elsewhere within the thoracic spine. Underlying bone marrow signal intensity diffusely heterogeneous. Note made of a 13 mm benign hemangioma within the T1 vertebral body. Adjacent T1 hypointensity without associated STIR correlate or enhancement noted (series 19, image 9), indeterminate, but most likely benign. No other discrete or worrisome osseous lesions. Vertebral body height otherwise maintained without acute or chronic fracture. No other abnormal marrow edema or enhancement. Cord: Signal intensity within the thoracic spinal cord is normal. Normal cord caliber and morphology. No epidural tumor or abnormal enhancement. Paraspinal and other soft tissues: Paraspinous soft tissues demonstrate no acute finding. Partially visualized lungs are grossly clear. Visualized visceral structures within normal limits. Disc levels: Mild for age scattered multilevel disc bulging seen within the mid and lower thoracic spine. Scattered multilevel facet hypertrophy noted as well, most notable at T9-10 through T11-12. No significant canal or foraminal stenosis. No neural impingement. MRI LUMBAR SPINE FINDINGS Segmentation: Transitional lumbosacral anatomy with partial sacralization of the L5 vertebral body. Alignment: Mild levoscoliosis. 3 mm chronic facet mediated anterolisthesis of L4 on L5. Alignment otherwise normal with  preservation of the normal lumbar lordosis. Vertebrae: Vertebral body height maintained without evidence for acute or chronic fracture. Bone marrow signal intensity diffusely heterogeneous. No discrete or worrisome osseous lesions. No abnormal marrow edema or enhancement. No other evidence for metastatic disease within the lumbar spine or visualized sacrum/pelvis. Conus medullaris: Extends to the L2 level and appears normal. No abnormal enhancement or epidural tumor. Paraspinal and other soft tissues: Paraspinous soft tissues demonstrate no acute finding. No paraspinous mass or abnormal enhancement. Enlarged 3.9 cm left external iliac node noted, corresponding with abnormality seen on prior CT. Remainder the visualized visceral structures otherwise unremarkable. Disc levels: L1-2: Normal interspace. Mild bilateral facet hypertrophy. No canal or foraminal stenosis. L2-3: Chronic intervertebral disc space narrowing with diffuse disc bulge and disc desiccation. Associated mild reactive endplate changes. Superimposed shallow left extraforaminal disc protrusion closely approximates the exiting left L2 nerve root as it courses of the left neural foramen (series 4, image 20). Mild to moderate facet hypertrophy. No significant spinal stenosis. Foramina remain patent. L3-4: Chronic intervertebral disc space narrowing with diffuse disc bulge and disc desiccation. Superimposed left foraminal disc protrusion seen at the inferior aspect of  the left neural foramen (series 4, image 26). Moderate facet and ligament flavum hypertrophy. Resultant mild spinal stenosis. Foramina remain patent. L4-5: Trace anterolisthesis. Chronic intervertebral disc space narrowing with diffuse disc bulge and disc desiccation. Reactive endplate changes with marginal endplate osteophytic spurring. Moderate to severe right worse than left facet hypertrophy with ligament flavum thickening. Resultant mild canal with bilateral lateral recess stenosis. Mild  right worse than left L4 foraminal narrowing. 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 Mild disc bulge with reactive endplate changes. Moderate left greater than right facet hypertrophy. Resultant mild left greater than right lateral recess stenosis. Moderate bilateral L5 foraminal narrowing, right greater than left. IMPRESSION: 1. 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 No significant bony retropulsion or stenosis. No significant epidural or paraspinous tumor at this time. 2. No other evidence for osseous metastatic disease within the thoracic or lumbar spine. 3. 3.9 cm left external iliac node, consistent with nodal metastasis. 4. Small left foraminal to extraforaminal disc protrusions at L2-3 and L3-4, closely approximating and potentially irritating either of the exiting left L2 or L3 nerve roots respectively. 5. Multifactorial degenerative changes at L4-5 and L5-S1 with resultant mild to moderate bilateral neural foraminal narrowing, with mild spinal stenosis at the L4-5 level. Electronically Signed   By: Jeannine Boga M.D.   On: 03/19/2019 19:12   NM Bone Scan Whole Body  Result Date: 03/03/2019 CLINICAL DATA:  Bone lesion and lymphadenopathy in the pelvis. EXAM: NUCLEAR MEDICINE WHOLE BODY BONE SCAN TECHNIQUE: Whole body anterior and posterior images were obtained approximately 3 hours after intravenous injection of radiopharmaceutical. RADIOPHARMACEUTICALS:  22.7 mCi Technetium-57m MDP IV COMPARISON:  CT 02/15/2019 FINDINGS: Focal uptake in the L1 vertebral body corresponds to lytic lesion on comparison CT and consistent with bone metastasis. Small focus of uptake within the RIGHT scapula body. No additional foci radiotracer uptake to suggest skeletal metastasis. 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 Indeterminate lesion in the RIGHT scapula body.  Electronically Signed   By: Suzy Bouchard M.D.   On: 03/03/2019 16:10   IR Radiologist Eval & Mgmt  Result Date: 03/21/2019 Please refer to notes tab for details about interventional procedure. (Op Note)   Labs:  CBC: Recent Labs    02/15/19 1043  WBC 6.5  HGB 14.1  HCT 42.6  PLT 204    COAGS: No results for input(s): INR, APTT in the last 8760 hours.  BMP: Recent Labs    02/15/19 1043  NA 133*  K 4.6  CL 104  CO2 24  GLUCOSE 93  BUN 10  CALCIUM 8.7*  CREATININE 0.95  GFRNONAA >60  GFRAA >60    LIVER FUNCTION TESTS: Recent Labs    02/15/19 1043  BILITOT 0.9  AST 26  ALT 22  ALKPHOS 39  PROT 7.8  ALBUMIN 4.1    TUMOR MARKERS: No results for input(s): AFPTM, CEA, CA199, CHROMGRNA in the last 8760 hours.  Assessment and Plan:  79 y/o M with likely metastatic prostate cancer and pathologic T12 fracture followed by Dr. Grayland Ormond who presents today for a T12 bone lesion biopsy with vertebral augmentation and RFA ablation as previously discussed with Dr. Earleen Newport on 03/21/19.  Patient has been NPO since 8 pm yesterday except for a small sip of water with his morning medications, he does not take any blood thinning medications. Afebrile, WBC 6.4, hgb 14.3, plt 202, INR 1.0, creatinine 0.95.  Risks  and benefits of T12 bone lesion biopsy was discussed with the patient and/or patient's family including, but not limited to bleeding, infection, damage to adjacent structures or low yield requiring additional tests.  Risks and benefits of T12 kyphoplasty/vertebroplasty with RFA ablation were discussed with the patient including, but not limited to education regarding the natural healing process of compression fractures without intervention, bleeding, infection, cement migration which may cause spinal cord damage, paralysis, pulmonary embolism or even death.  This interventional procedure involves the use of X-rays and because of the nature of the planned procedure, it is  possible that we will have prolonged use of X-ray fluoroscopy. Potential radiation risks to you include (but are not limited to) the following: - A slightly elevated risk for cancer  several years later in life. This risk is typically less than 0.5% percent. This risk is low in comparison to the normal incidence of human cancer, which is 33% for women and 50% for men according to the [place]. - Radiation induced injury can include skin redness, resembling a rash, tissue breakdown / ulcers and hair loss (which can be temporary or permanent).  The likelihood of either of these occurring depends on the difficulty of the procedure and whether you are sensitive to radiation due to previous procedures, disease, or genetic conditions.  IF your procedure requires a prolonged use of radiation, you will be notified and given written instructions for further action.  It is your responsibility to monitor the irradiated area for the 2 weeks following the procedure and to notify your physician if you are concerned that you have suffered a radiation induced injury.    All of the patient's questions were answered, patient is agreeable to proceed.  Consent signed and in chart.   Thank you for this interesting consult.  I greatly enjoyed meeting Evan Gonzalez and look forward to participating in their care.  A copy of this report was sent to the requesting provider on this date.  Electronically Signed: Joaquim Nam, PA-C 03/28/2019, 7:30 AM   I spent a total of30 Minutes  in face to face in clinical consultation, greater than 50% of which was counseling/coordinating care for T12 bone lesion biopsy, T12 vertebral augmentation and RFA ablation.

## 2019-03-28 NOTE — Sedation Documentation (Signed)
Patient in pain, moving on the table. Reassured, repositioned and medicated.

## 2019-03-30 LAB — SURGICAL PATHOLOGY

## 2019-04-03 ENCOUNTER — Emergency Department
Admission: EM | Admit: 2019-04-03 | Discharge: 2019-04-03 | Disposition: A | Discharge status: Home / Self Care | Payer: Medicare Other | Attending: Emergency Medicine | Admitting: Emergency Medicine

## 2019-04-03 ENCOUNTER — Encounter: Payer: Self-pay | Admitting: Emergency Medicine

## 2019-04-03 ENCOUNTER — Other Ambulatory Visit: Payer: Self-pay | Admitting: Interventional Radiology

## 2019-04-03 ENCOUNTER — Emergency Department: Payer: Medicare Other

## 2019-04-03 ENCOUNTER — Other Ambulatory Visit: Payer: Self-pay

## 2019-04-03 DIAGNOSIS — E119 Type 2 diabetes mellitus without complications: Secondary | ICD-10-CM | POA: Diagnosis not present

## 2019-04-03 DIAGNOSIS — H81399 Other peripheral vertigo, unspecified ear: Secondary | ICD-10-CM

## 2019-04-03 DIAGNOSIS — Z79899 Other long term (current) drug therapy: Secondary | ICD-10-CM | POA: Diagnosis not present

## 2019-04-03 DIAGNOSIS — Z7984 Long term (current) use of oral hypoglycemic drugs: Secondary | ICD-10-CM | POA: Insufficient documentation

## 2019-04-03 DIAGNOSIS — Z87891 Personal history of nicotine dependence: Secondary | ICD-10-CM | POA: Diagnosis not present

## 2019-04-03 DIAGNOSIS — I10 Essential (primary) hypertension: Secondary | ICD-10-CM | POA: Insufficient documentation

## 2019-04-03 DIAGNOSIS — R42 Dizziness and giddiness: Secondary | ICD-10-CM | POA: Insufficient documentation

## 2019-04-03 DIAGNOSIS — C61 Malignant neoplasm of prostate: Secondary | ICD-10-CM | POA: Insufficient documentation

## 2019-04-03 DIAGNOSIS — S22000A Wedge compression fracture of unspecified thoracic vertebra, initial encounter for closed fracture: Secondary | ICD-10-CM

## 2019-04-03 LAB — BASIC METABOLIC PANEL
Anion gap: 10 (ref 5–15)
BUN: 15 mg/dL (ref 8–23)
CO2: 24 mmol/L (ref 22–32)
Calcium: 8.9 mg/dL (ref 8.9–10.3)
Chloride: 103 mmol/L (ref 98–111)
Creatinine, Ser: 0.93 mg/dL (ref 0.61–1.24)
GFR calc Af Amer: >60 mL/min (ref >60)
GFR calc non Af Amer: >60 mL/min (ref >60)
Glucose, Bld: 123 mg/dL — ABNORMAL HIGH (ref 70–99)
Potassium: 4.6 mmol/L (ref 3.5–5.1)
Sodium: 137 mmol/L (ref 135–145)

## 2019-04-03 LAB — URINALYSIS, COMPLETE (UACMP) WITH MICROSCOPIC
Bacteria, UA: NONE SEEN
Bilirubin Urine: NEGATIVE
Glucose, UA: NEGATIVE mg/dL
Hgb urine dipstick: NEGATIVE
Ketones, ur: 20 mg/dL — AB
Leukocytes,Ua: NEGATIVE
Nitrite: NEGATIVE
Protein, ur: NEGATIVE mg/dL
Specific Gravity, Urine: 1.031 — ABNORMAL HIGH (ref 1.005–1.030)
pH: 6 (ref 5.0–8.0)

## 2019-04-03 LAB — CBC
HCT: 39.1 % (ref 39.0–52.0)
Hemoglobin: 12.8 g/dL — ABNORMAL LOW (ref 13.0–17.0)
MCH: 31 pg (ref 26.0–34.0)
MCHC: 32.7 g/dL (ref 30.0–36.0)
MCV: 94.7 fL (ref 80.0–100.0)
Platelets: 217 10*3/uL (ref 150–400)
RBC: 4.13 MIL/uL — ABNORMAL LOW (ref 4.22–5.81)
RDW: 13.2 % (ref 11.5–15.5)
WBC: 6.8 10*3/uL (ref 4.0–10.5)
nRBC: 0 % (ref 0.0–0.2)

## 2019-04-03 MED ORDER — MECLIZINE HCL 25 MG PO TABS: 25.0000 mg | ORAL_TABLET | Freq: Three times a day (TID) | ORAL | 1 refills | Status: DC | PRN

## 2019-04-03 MED ORDER — SODIUM CHLORIDE 0.9 % IV BOLUS
500.0000 mL | Freq: Once | INTRAVENOUS | Status: AC
  Administered 2019-04-03: 500 mL via INTRAVENOUS

## 2019-04-03 MED ORDER — MECLIZINE HCL 25 MG PO TABS
25.0000 mg | ORAL_TABLET | Freq: Once | ORAL | Status: AC
  Administered 2019-04-03: 25 mg via ORAL
  Filled 2019-04-03: qty 1

## 2019-04-03 MED ORDER — ONDANSETRON 4 MG PO TBDP: 4.0000 mg | ORAL_TABLET | Freq: Three times a day (TID) | ORAL | 0 refills | Status: DC | PRN

## 2019-04-03 MED ORDER — GADOBUTROL 1 MMOL/ML IV SOLN
10.0000 mL | Freq: Once | INTRAVENOUS | Status: AC | PRN
  Administered 2019-04-03: 10 mL via INTRAVENOUS

## 2019-04-03 MED ORDER — SODIUM CHLORIDE 0.9% FLUSH: 3.0000 mL | Freq: Once | INTRAVENOUS | Status: DC

## 2019-04-03 NOTE — Discharge Instructions (Signed)
Your lab test and MRI test today were all okay.  Please follow-up with your doctor for continued monitoring of your symptoms.

## 2019-04-03 NOTE — ED Notes (Signed)
Pt to mri 

## 2019-04-03 NOTE — ED Notes (Signed)
EDP at bedside  

## 2019-04-03 NOTE — ED Provider Notes (Signed)
[facility] Emergency Department Provider Note  ____________________________________________  Time seen: Approximately 10:53 AM  I have reviewed the triage vital signs and the nursing notes.   HISTORY  Chief Complaint Dizziness    HPI Evan Gonzalez is a 79 y.o. male with a history of diabetes hypertension hyperlipidemia and prior stroke who comes to the ED complaining of dizziness that started this morning, associated with feeling unsteady on his feet, nausea and vomiting.  Dizziness feels like vertigo/motion symptoms.  Worse with turning his head.  Had a recent IR biopsy and kyphoplasty of his thoracic spine.  Reports pain has been adequately controlled with occasional hydrocodone and Tylenol.  Eating okay, drinking fluids, denies other symptoms.  No chest pain shortness of breath or fever.   Denies any trauma.  Denies any sudden severe headache.  No blurry vision or double vision.  Reports his central and peripheral vision is normal.  He has had a history of peripheral vertigo in the past.   Past Medical History:  Diagnosis Date  . Diabetes mellitus without complication ([place])   . Glaucoma   . Hyperlipidemia   . Hypertension      Patient Active Problem List   Diagnosis Date Noted  . Prostate cancer ([place]) 03/11/2019  . Lymphadenopathy 02/21/2019  . Bone lesion 02/21/2019     Past Surgical History:  Procedure Laterality Date  . EYE SURGERY    . IR BONE TUMOR(S)RF ABLATION  03/28/2019  . IR KYPHO THORACIC WITH BONE BIOPSY  03/28/2019  . IR RADIOLOGIST EVAL & MGMT  03/21/2019     Prior to Admission medications   Medication Sig Start Date End Date Taking? Authorizing Provider  acetaminophen (TYLENOL) 650 MG CR tablet Take 650 mg by mouth 2 (two) times daily.    [provider]  benzonatate (TESSALON) 200 MG capsule Take 1 capsule (200 mg total) by mouth 3 (three) times daily as needed for cough. 02/07/17   Melynda Ripple, MD   Brinzolamide-Brimonidine [facility]) 1-0.2 % SUSP Simbrinza 1 %-0.2 % eye drops,suspension    [provider]  dipyridamole-aspirin (AGGRENOX) 200-25 MG 12hr capsule Take 1 capsule by mouth 2 (two) times daily.    [provider]  dorzolamide (TRUSOPT) 2 % ophthalmic solution Place 1 drop into both eyes 2 (two) times daily.    [provider]  fluticasone (FLONASE) 50 MCG/ACT nasal spray Place 2 sprays into both nostrils daily. 02/07/17   Melynda Ripple, MD  glipiZIDE (GLUCOTROL) 5 MG tablet Take 5 mg by mouth daily before breakfast.    [provider]  HYDROcodone-acetaminophen (NORCO) 5-325 MG tablet Take 1 tablet by mouth every 6 (six) hours as needed for moderate pain. Take 2 at night as needed. 03/13/19   Lloyd Huger, MD  latanoprost (XALATAN) 0.005 % ophthalmic solution Place 1 drop into both eyes at bedtime.    [provider]  lisinopril (PRINIVIL,ZESTRIL) 5 MG tablet Take 5 mg by mouth daily as needed (hypertension).     [provider]  meclizine (ANTIVERT) 25 MG tablet Take 1 tablet (25 mg total) by mouth 3 (three) times daily as needed for dizziness or nausea. 04/03/19   Carrie Mew, MD  metFORMIN (GLUCOPHAGE) 500 MG tablet Take 500 mg by mouth 2 (two) times daily with a meal.    [provider]  ondansetron (ZOFRAN ODT) 4 MG disintegrating tablet Take 1 tablet (4 mg total) by mouth every 8 (eight) hours as needed for nausea or vomiting.  04/03/19   Carrie Mew, MD  simvastatin (ZOCOR) 20 MG tablet Take 20 mg by mouth at bedtime.    [provider]     Allergies Patient has no known allergies.   Family History  Problem Relation Age of Onset  . Heart attack Mother   . Diabetes Father     Social History Social History   Tobacco Use  . Smoking status: Former Smoker    Quit date: 02/08/1987    Years since quitting: 32.1  . Smokeless tobacco: Never Used  Substance Use Topics  .  Alcohol use: No  . Drug use: No    Review of Systems  Constitutional:   No fever or chills.  ENT:   No sore throat. No rhinorrhea. Cardiovascular:   No chest pain or syncope. Respiratory:   No dyspnea or cough. Gastrointestinal:   Negative for abdominal pain, vomiting and diarrhea.  Musculoskeletal:   Negative for focal pain or swelling All other systems reviewed and are negative except as documented above in ROS and HPI.  ____________________________________________   PHYSICAL EXAM:  VITAL SIGNS: ED Triage Vitals  Enc Vitals Group     BP 04/03/19 1032 132/82     Pulse Rate 04/03/19 1032 93     Resp 04/03/19 1032 10     Temp 04/03/19 1032 97.6 F (36.4 C)     Temp Source 04/03/19 1032 Oral     SpO2 04/03/19 1032 97 %     Weight 04/03/19 1033 265 lb (120.2 kg)     Height 04/03/19 1033 5\' 11"  (1.803 m)     Head Circumference --      Peak Flow --      Pain Score 04/03/19 1032 2     Pain Loc --      Pain Edu? --      Excl. in [place]? --     Vital signs reviewed, nursing assessments reviewed.   Constitutional:   Alert and oriented. Non-toxic appearance.  Morbidly obese Eyes:   Conjunctivae are normal. EOMI. PERRL.  No nystagmus ENT      Head:   Normocephalic and atraumatic.      Nose:   Wearing a mask.      Mouth/Throat:   Wearing a mask.      Neck:   No meningismus. Full ROM. Hematological/Lymphatic/Immunilogical:   No cervical lymphadenopathy. Cardiovascular:   RRR. Symmetric bilateral radial and DP pulses.  No murmurs. Cap refill less than 2 seconds. Respiratory:   Normal respiratory effort without tachypnea/retractions. Breath sounds are clear and equal bilaterally. No wheezes/rales/rhonchi. Gastrointestinal:   Soft and nontender. Non distended. There is no CVA tenderness.  No rebound, rigidity, or guarding. Musculoskeletal:   Normal range of motion in all extremities. No joint effusions.  No lower extremity tenderness.  No edema. Neurologic:   Normal speech and  language.  Motor grossly intact. No limb drift x4 Mild ataxia of right upper extremity, chronic per patient No acute focal neurologic deficits are appreciated.  NIH stroke scale equals 1, chronic Skin:    Skin is warm, dry and intact. No rash noted.  No petechiae, purpura, or bullae.  ____________________________________________    LABS (pertinent positives/negatives) (all labs ordered are listed, but only abnormal results are displayed) Labs Reviewed  BASIC METABOLIC PANEL - Abnormal; Notable for the following components:      Result Value   Glucose, Bld 123 (*)    All other components within normal limits  CBC - Abnormal; Notable  for the following components:   RBC 4.13 (*)    Hemoglobin 12.8 (*)    All other components within normal limits  URINALYSIS, COMPLETE (UACMP) WITH MICROSCOPIC - Abnormal; Notable for the following components:   Color, Urine AMBER (*)    APPearance CLOUDY (*)    Specific Gravity, Urine 1.031 (*)    Ketones, ur 20 (*)    All other components within normal limits  CBG MONITORING, ED   ____________________________________________   EKG  Interpreted by me Sinus rhythm rate of 91, left axis, normal intervals.  Normal QRS ST segments and T waves.  ____________________________________________    RADIOLOGY  MR ANGIO HEAD WO CONTRAST  Result Date: 04/03/2019 CLINICAL DATA:  Ataxia, stroke suspected, metastatic prostate cancer, history of CVA. Headache, sudden, carotid/vertebral artery dissection suspected. EXAM: MRI HEAD WITHOUT AND WITH CONTRAST MRA HEAD WITHOUT CONTRAST TECHNIQUE: Multiplanar, multiecho pulse sequences of the brain and surrounding structures were obtained without and with intravenous contrast. Angiographic images of the head were obtained using MRA technique without contrast. CONTRAST:  76mL GADAVIST GADOBUTROL 1 MMOL/ML IV SOLN COMPARISON:  Head CT 09/24/2017 FINDINGS: MRI HEAD FINDINGS Brain: Multiple sequences are mildly motion  degraded. There is no evidence of acute infarct. No evidence of intracranial mass. No midline shift or extra-axial fluid collection. No definite chronic intracranial blood products. Mild scattered T2/FLAIR hyperintensity within the cerebral white matter is nonspecific, but consistent with chronic small vessel ischemic disease. There are small chronic lacunar infarcts within the bilateral cerebral white matter (series 7, image 9). Moderate generalized parenchymal atrophy. No abnormal intracranial enhancement is identified. Vascular: Reported separately Skull and upper cervical spine: No focal marrow lesion Sinuses/Orbits: Visualized orbits demonstrate no acute abnormality. No significant paranasal sinus disease or mastoid effusion. MRA HEAD FINDINGS The intracranial internal carotid arteries are patent. Symmetric apparent paucity of flow related signal within the bilateral internal carotid arteries at the level of the skull base is suspected artifactual given symmetry. Stenoses at these sites cannot be definitively excluded. Otherwise, no significant stenosis within these vessels. The M1 middle cerebral arteries are patent bilaterally without significant stenosis. No M2 proximal branch occlusion or high-grade proximal arterial stenosis is identified. The bilateral anterior cerebral arteries are patent without high-grade proximal stenosis. Hypoplastic A1 right ACA. 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 Diminutive vertebrobasilar system likely related to fetal origin posterior cerebral arteries bilaterally. The intracranial vertebral arteries are patent without significant stenosis. The basilar artery is patent without significant stenosis. The posterior cerebral arteries are patent without high-grade proximal stenosis. IMPRESSION: MRI brain: 1. Mildly motion degraded study. 2. No evidence of acute intracranial  abnormality, including acute infarct. 3. No abnormal enhancement demonstrated to suggest intracranial metastatic disease. 4. Mild chronic small vessel ischemic disease. 5. Small chronic lacunar infarcts within the cerebellar white matter bilaterally. 6. Moderate generalized parenchymal atrophy. MRA head: 1. Symmetric apparent paucity of flow related signal within the bilateral internal carotid arteries at the level of the skull base is suspected artifactual given symmetry. Stenoses at these sites cannot be definitively excluded. 2. Otherwise, no intracranial high-grade proximal arterial stenosis is demonstrated. 3. 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 4. Fetal origin posterior communicating arteries bilaterally. Electronically Signed   By: Kellie Simmering DO   On: 04/03/2019 12:36   MR Angiogram Neck W or Wo Contrast  Result Date: 04/03/2019  EXAM: MRA NECK WITHOUT AND WITH CONTRAST TECHNIQUE: Multiplanar and multiecho pulse sequences of the neck were obtained without and with intravenous contrast. Angiographic images of the neck were obtained using MRA technique without and with intravenous contrast. CONTRAST:  58mL GADAVIST GADOBUTROL 1 MMOL/ML IV SOLN COMPARISON:  No pertinent prior studies available for comparison. FINDINGS: Standard aortic branching. The bilateral common and internal carotid arteries are patent within the neck without significant (50% or greater). The apparent symmetric stenoses of the internal carotid arteries at the level of the skull base demonstrated on concurrently performed noncontrast MRA of the head are confirmed artifactual. No significant stenosis at these sites. The bilateral cervical vertebral arteries are developmentally diminutive, although patent throughout the neck without stenosis. No evidence of dissection or aneurysm. IMPRESSION: The bilateral common carotid, internal carotid and vertebral  arteries are patent within the neck without significant stenosis. No evidence of dissection or aneurysm. The apparent symmetric stenoses of the internal carotid arteries at the level of the skull base demonstrated on concurrently performed non-contrast MRA of the head are confirmed artifactual. No significant stenosis at these sites. Electronically Signed   By: Kellie Simmering DO   On: 04/03/2019 12:42   MR Brain W and Wo Contrast  Result Date: 04/03/2019 CLINICAL DATA:  Ataxia, stroke suspected, metastatic prostate cancer, history of CVA. Headache, sudden, carotid/vertebral artery dissection suspected. EXAM: MRI HEAD WITHOUT AND WITH CONTRAST MRA HEAD WITHOUT CONTRAST TECHNIQUE: Multiplanar, multiecho pulse sequences of the brain and surrounding structures were obtained without and with intravenous contrast. Angiographic images of the head were obtained using MRA technique without contrast. CONTRAST:  89mL GADAVIST GADOBUTROL 1 MMOL/ML IV SOLN COMPARISON:  Head CT 09/24/2017 FINDINGS: MRI HEAD FINDINGS Brain: Multiple sequences are mildly motion degraded. There is no evidence of acute infarct. No evidence of intracranial mass. No midline shift or extra-axial fluid collection. No definite chronic intracranial blood products. Mild scattered T2/FLAIR hyperintensity within the cerebral white matter is nonspecific, but consistent with chronic small vessel ischemic disease. There are small chronic lacunar infarcts within the bilateral cerebral white matter (series 7, image 9). Moderate generalized parenchymal atrophy. No abnormal intracranial enhancement is identified. Vascular: Reported separately Skull and upper cervical spine: No focal marrow lesion Sinuses/Orbits: Visualized orbits demonstrate no acute abnormality. No significant paranasal sinus disease or mastoid effusion. MRA HEAD FINDINGS The intracranial internal carotid arteries are patent. Symmetric apparent paucity of flow related signal within the bilateral  internal carotid arteries at the level of the skull base is suspected artifactual given symmetry. Stenoses at these sites cannot be definitively excluded. Otherwise, no significant stenosis within these vessels. The M1 middle cerebral arteries are patent bilaterally without significant stenosis. No M2 proximal branch occlusion or high-grade proximal arterial stenosis is identified. The bilateral anterior cerebral arteries are patent without high-grade proximal stenosis. Hypoplastic A1 right ACA. 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 Diminutive vertebrobasilar system likely related to fetal origin posterior cerebral arteries bilaterally. The intracranial vertebral arteries are patent without significant stenosis. The basilar artery is patent without significant stenosis. The posterior cerebral arteries are patent without high-grade proximal stenosis. IMPRESSION: MRI brain: 1. Mildly motion degraded study. 2. No evidence of acute intracranial abnormality, including acute infarct. 3. No abnormal enhancement demonstrated to suggest intracranial metastatic disease. 4. Mild chronic small vessel ischemic disease. 5. Small chronic lacunar infarcts within the cerebellar white matter bilaterally. 6. Moderate generalized parenchymal atrophy. MRA head:  1. Symmetric apparent paucity of flow related signal within the bilateral internal carotid arteries at the level of the skull base is suspected artifactual given symmetry. Stenoses at these sites cannot be definitively excluded. 2. Otherwise, no intracranial high-grade proximal arterial stenosis is demonstrated. 3. 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 4. Fetal origin posterior communicating arteries bilaterally. Electronically Signed   By: Kellie Simmering DO   On: 04/03/2019 12:36     ____________________________________________   PROCEDURES Procedures  ____________________________________________  DIFFERENTIAL DIAGNOSIS   Posterior stroke, arterial insufficiency, dehydration, peripheral vertigo, intracranial metastatic disease/cerebral edema  CLINICAL IMPRESSION / ASSESSMENT AND PLAN / ED COURSE  Medications ordered in the ED: Medications  sodium chloride flush (NS) 0.9 % injection 3 mL (3 mLs Intravenous Not Given 04/03/19 1259)  gadobutrol (GADAVIST) 1 MMOL/ML injection 10 mL (10 mLs Intravenous Contrast Given 04/03/19 1204)  sodium chloride 0.9 % bolus 500 mL (0 mLs Intravenous Stopped 04/03/19 1443)  meclizine (ANTIVERT) tablet 25 mg (25 mg Oral Given 04/03/19 1308)    Pertinent labs & imaging results that were available during my care of the patient were reviewed by me and considered in my medical decision making (see chart for details).  ZOHAN SHIFLET was evaluated in Emergency Department on 04/03/2019 for the symptoms described in the history of present illness. He was evaluated in the context of the global COVID-19 pandemic, which necessitated consideration that the patient might be at risk for infection with the SARS-CoV-2 virus that causes COVID-19. Institutional protocols and algorithms that pertain to the evaluation of patients at risk for COVID-19 are in a state of rapid change based on information released by regulatory bodies including the CDC and federal and state organizations. These policies and algorithms were followed during the patient's care in the ED.   Patient presents with vertiginous symptoms that started this morning.  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  Contacted his oncologist who recommends MRI with and without contrast.  We will give IV fluids for hydration, check labs.  Vital signs are normal.  Patient is nontoxic.  If he is  feeling better and work-up is reassuring I think he could be discharged home.  Clinical Course as of Apr 02 1446  Mon Apr 03, 2019  1352 MRI head and neck unremarkable.  No evidence of stroke or significant vascular occlusion.  Vital signs unremarkable.  Treating with IV fluids and meclizine, will be stable for discharge.   [PS]    Clinical Course User Index [PS] Carrie Mew, MD     ----------------------------------------- 2:48 PM on 04/03/2019 -----------------------------------------  Urinalysis unremarkable.  Will treat with Zofran and meclizine, suitable for outpatient follow-up.  ____________________________________________   FINAL CLINICAL IMPRESSION(S) / ED DIAGNOSES    Final diagnoses:  Peripheral vertigo, unspecified laterality     ED Discharge Orders         Ordered    ondansetron (ZOFRAN ODT) 4 MG disintegrating tablet  Every 8 hours PRN     04/03/19 1447    meclizine (ANTIVERT) 25 MG tablet  3 times daily PRN     04/03/19 1447          Portions of this note were generated with dragon dictation software. Dictation errors may occur despite best attempts at proofreading.   Carrie Mew, MD 04/03/19 [phone]

## 2019-04-03 NOTE — ED Triage Notes (Signed)
Pt via ems from home with dizziness that began this morning. He had back surgery last week (fusion) and has been tolerating it relatively well. He has been taking hydrocodone when the pain is severe, and tylenol when it isn't, although he states pain is most severe in the morning. Today, he took tylenol. Pt has hx of cva 30 years ago; retains some right sided weakness. Pt states he does not have any follow up appointment with back surgeon; only has appt with cancer doc, as they are "trying to find out what is going on." PT alert & oriented, nad noted.

## 2019-04-04 ENCOUNTER — Ambulatory Visit: Payer: Medicare Other | Admitting: Oncology

## 2019-04-06 NOTE — Progress Notes (Signed)
[place]  [phone] [phone]) [phone]  ID: Evan Gonzalez OB: 14-Feb-1941  MR#: [id number]  LKG#:[id number]  Patient Care Team: Theotis Burrow, MD as PCP - General (Family Medicine)  CHIEF COMPLAINT: Stage IV prostate cancer.  INTERVAL HISTORY: Patient returns to clinic today for further evaluation and initiation of Eligard.  His back pain has significantly improved, but still evident, since his RFA last week.  He denies any other pain.  He has no neurologic complaints.  He denies any recent fevers or illnesses.  He has a good appetite and denies weight loss.  He has no chest pain, shortness of breath, cough, or hemoptysis.  He denies any nausea, vomiting, constipation, or diarrhea.  He has no urinary complaints.  Patient offers no further specific complaints today.  REVIEW OF SYSTEMS:   Review of Systems  Constitutional: Negative.  Negative for fever and malaise/fatigue.  Respiratory: Negative.  Negative for cough and shortness of breath.   Cardiovascular: Negative.  Negative for chest pain and leg swelling.  Gastrointestinal: Negative.  Negative for abdominal pain.  Genitourinary: Negative.  Negative for flank pain.  Musculoskeletal: Positive for back pain.  Skin: Negative.  Negative for rash.  Neurological: Negative.  Negative for dizziness, focal weakness, weakness and headaches.  Psychiatric/Behavioral: Negative.  The patient is not nervous/anxious.     As per HPI. Otherwise, a complete review of systems is negative.  PAST MEDICAL HISTORY: Past Medical History:  Diagnosis Date  . Diabetes mellitus without complication ([place])   . Glaucoma   . Hyperlipidemia   . Hypertension     PAST SURGICAL HISTORY: Past Surgical History:  Procedure Laterality Date  . EYE SURGERY    . IR BONE TUMOR(S)RF ABLATION  03/28/2019  . IR KYPHO THORACIC WITH BONE BIOPSY  03/28/2019  . IR RADIOLOGIST EVAL & MGMT  03/21/2019  . IR RADIOLOGIST EVAL &  MGMT  04/11/2019    FAMILY HISTORY: Family History  Problem Relation Age of Onset  . Heart attack Mother   . Diabetes Father     ADVANCED DIRECTIVES (Y/N):  N  HEALTH MAINTENANCE: Social History   Tobacco Use  . Smoking status: Former Smoker    Quit date: 02/08/1987    Years since quitting: 32.1  . Smokeless tobacco: Never Used  Substance Use Topics  . Alcohol use: No  . Drug use: No     Colonoscopy:  PAP:  Bone density:  Lipid panel:  No Known Allergies  Current Outpatient Medications  Medication Sig Dispense Refill  . acetaminophen (TYLENOL) 650 MG CR tablet Take 650 mg by mouth 2 (two) times daily.    Marland Kitchen dipyridamole-aspirin (AGGRENOX) 200-25 MG 12hr capsule Take 1 capsule by mouth 2 (two) times daily.    . dorzolamide (TRUSOPT) 2 % ophthalmic solution Place 1 drop into both eyes 2 (two) times daily.    Marland Kitchen glipiZIDE (GLUCOTROL) 5 MG tablet Take 5 mg by mouth daily before breakfast.    . HYDROcodone-acetaminophen (NORCO) 5-325 MG tablet Take 1 tablet by mouth every 6 (six) hours as needed for moderate pain. Take 2 at night as needed. 90 tablet 0  . latanoprost (XALATAN) 0.005 % ophthalmic solution Place 1 drop into both eyes at bedtime.    Marland Kitchen lisinopril (PRINIVIL,ZESTRIL) 5 MG tablet Take 5 mg by mouth daily as needed (hypertension).     . meclizine (ANTIVERT) 25 MG tablet Take 1 tablet (25 mg total) by mouth 3 (three) times daily as needed for dizziness  or nausea. 30 tablet 1  . metFORMIN (GLUCOPHAGE) 500 MG tablet Take 500 mg by mouth 2 (two) times daily with a meal.    . ondansetron (ZOFRAN ODT) 4 MG disintegrating tablet Take 1 tablet (4 mg total) by mouth every 8 (eight) hours as needed for nausea or vomiting. 20 tablet 0  . simvastatin (ZOCOR) 20 MG tablet Take 20 mg by mouth at bedtime.    . benzonatate (TESSALON) 200 MG capsule Take 1 capsule (200 mg total) by mouth 3 (three) times daily as needed for cough. (Patient not taking: Reported on 04/11/2019) 30 capsule 0    . Brinzolamide-Brimonidine (SIMBRINZA) 1-0.2 % SUSP Simbrinza 1 %-0.2 % eye drops,suspension    . fluticasone (FLONASE) 50 MCG/ACT nasal spray Place 2 sprays into both nostrils daily. (Patient not taking: Reported on 04/11/2019) 16 g 0   No current facility-administered medications for this visit.    OBJECTIVE: Vitals:   04/11/19 1027  BP: 134/82  Pulse: 68  Resp: 20  Temp: 98.3 F (36.8 C)     Body mass index is 36.12 kg/m.    ECOG FS:1 - Symptomatic but completely ambulatory  General: Well-developed, well-nourished, no acute distress. Eyes: Pink conjunctiva, anicteric sclera. HEENT: Normocephalic, moist mucous membranes. Lungs: No audible wheezing or coughing. Heart: Regular rate and rhythm. Abdomen: Soft, nontender, no obvious distention. Musculoskeletal: No edema, cyanosis, or clubbing. Neuro: Alert, answering all questions appropriately. Cranial nerves grossly intact. Skin: No rashes or petechiae noted. Psych: Normal affect.  LAB RESULTS:  Lab Results  Component Value Date   NA 139 04/11/2019   K 4.7 04/11/2019   CL 105 04/11/2019   CO2 26 04/11/2019   GLUCOSE 99 04/11/2019   BUN 13 04/11/2019   CREATININE 0.88 04/11/2019   CALCIUM 9.1 04/11/2019   PROT 7.3 04/11/2019   ALBUMIN 3.7 04/11/2019   AST 27 04/11/2019   ALT 28 04/11/2019   ALKPHOS 42 04/11/2019   BILITOT 0.8 04/11/2019   GFRNONAA >60 04/11/2019   GFRAA >60 04/11/2019    Lab Results  Component Value Date   WBC 6.2 04/11/2019   NEUTROABS 3.0 04/11/2019   HGB 12.0 (L) 04/11/2019   HCT 37.6 (L) 04/11/2019   MCV 96.9 04/11/2019   PLT 268 04/11/2019     STUDIES: MR ANGIO HEAD WO CONTRAST  Result Date: 04/03/2019 CLINICAL DATA:  Ataxia, stroke suspected, metastatic prostate cancer, history of CVA. Headache, sudden, carotid/vertebral artery dissection suspected. EXAM: MRI HEAD WITHOUT AND WITH CONTRAST MRA HEAD WITHOUT CONTRAST TECHNIQUE: Multiplanar, multiecho pulse sequences of the brain and  surrounding structures were obtained without and with intravenous contrast. Angiographic images of the head were obtained using MRA technique without contrast. CONTRAST:  11mL GADAVIST GADOBUTROL 1 MMOL/ML IV SOLN COMPARISON:  Head CT 09/24/2017 FINDINGS: MRI HEAD FINDINGS Brain: Multiple sequences are mildly motion degraded. There is no evidence of acute infarct. No evidence of intracranial mass. No midline shift or extra-axial fluid collection. No definite chronic intracranial blood products. Mild scattered T2/FLAIR hyperintensity within the cerebral white matter is nonspecific, but consistent with chronic small vessel ischemic disease. There are small chronic lacunar infarcts within the bilateral cerebral white matter (series 7, image 9). Moderate generalized parenchymal atrophy. No abnormal intracranial enhancement is identified. Vascular: Reported separately Skull and upper cervical spine: No focal marrow lesion Sinuses/Orbits: Visualized orbits demonstrate no acute abnormality. No significant paranasal sinus disease or mastoid effusion. MRA HEAD FINDINGS The intracranial internal carotid arteries are patent. Symmetric apparent paucity of flow related  signal within the bilateral internal carotid arteries at the level of the skull base is suspected artifactual given symmetry. Stenoses at these sites cannot be definitively excluded. Otherwise, no significant stenosis within these vessels. The M1 middle cerebral arteries are patent bilaterally without significant stenosis. No M2 proximal branch occlusion or high-grade proximal arterial stenosis is identified. The bilateral anterior cerebral arteries are patent without high-grade proximal stenosis. Hypoplastic A1 right ACA. 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 Diminutive vertebrobasilar system likely related to fetal origin posterior cerebral arteries bilaterally.  The intracranial vertebral arteries are patent without significant stenosis. The basilar artery is patent without significant stenosis. The posterior cerebral arteries are patent without high-grade proximal stenosis. IMPRESSION: MRI brain: 1. Mildly motion degraded study. 2. No evidence of acute intracranial abnormality, including acute infarct. 3. No abnormal enhancement demonstrated to suggest intracranial metastatic disease. 4. Mild chronic small vessel ischemic disease. 5. Small chronic lacunar infarcts within the cerebellar white matter bilaterally. 6. Moderate generalized parenchymal atrophy. MRA head: 1. Symmetric apparent paucity of flow related signal within the bilateral internal carotid arteries at the level of the skull base is suspected artifactual given symmetry. Stenoses at these sites cannot be definitively excluded. 2. Otherwise, no intracranial high-grade proximal arterial stenosis is demonstrated. 3. 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 4. Fetal origin posterior communicating arteries bilaterally. Electronically Signed   By: Kellie Simmering DO   On: 04/03/2019 12:36   MR Angiogram Neck W or Wo Contrast  Result Date: 04/03/2019 EXAM: MRA NECK WITHOUT AND WITH CONTRAST TECHNIQUE: Multiplanar and multiecho pulse sequences of the neck were obtained without and with intravenous contrast. Angiographic images of the neck were obtained using MRA technique without and with intravenous contrast. CONTRAST:  60mL GADAVIST GADOBUTROL 1 MMOL/ML IV SOLN COMPARISON:  No pertinent prior studies available for comparison. FINDINGS: Standard aortic branching. The bilateral common and internal carotid arteries are patent within the neck without significant (50% or greater). The apparent symmetric stenoses of the internal carotid arteries at the level of the skull base demonstrated on concurrently performed noncontrast MRA of the head are  confirmed artifactual. No significant stenosis at these sites. The bilateral cervical vertebral arteries are developmentally diminutive, although patent throughout the neck without stenosis. No evidence of dissection or aneurysm. IMPRESSION: The bilateral common carotid, internal carotid and vertebral arteries are patent within the neck without significant stenosis. No evidence of dissection or aneurysm. The apparent symmetric stenoses of the internal carotid arteries at the level of the skull base demonstrated on concurrently performed non-contrast MRA of the head are confirmed artifactual. No significant stenosis at these sites. Electronically Signed   By: Kellie Simmering DO   On: 04/03/2019 12:42   MR Brain W and Wo Contrast  Result Date: 04/03/2019 CLINICAL DATA:  Ataxia, stroke suspected, metastatic prostate cancer, history of CVA. Headache, sudden, carotid/vertebral artery dissection suspected. EXAM: MRI HEAD WITHOUT AND WITH CONTRAST MRA HEAD WITHOUT CONTRAST TECHNIQUE: Multiplanar, multiecho pulse sequences of the brain and surrounding structures were obtained without and with intravenous contrast. Angiographic images of the head were obtained using MRA technique without contrast. CONTRAST:  32mL GADAVIST GADOBUTROL 1 MMOL/ML IV SOLN COMPARISON:  Head CT 09/24/2017 FINDINGS: MRI HEAD FINDINGS Brain: Multiple sequences are mildly motion degraded. There is no evidence of acute infarct. No evidence of intracranial mass. No midline shift or extra-axial  fluid collection. No definite chronic intracranial blood products. Mild scattered T2/FLAIR hyperintensity within the cerebral white matter is nonspecific, but consistent with chronic small vessel ischemic disease. There are small chronic lacunar infarcts within the bilateral cerebral white matter (series 7, image 9). Moderate generalized parenchymal atrophy. No abnormal intracranial enhancement is identified. Vascular: Reported separately Skull and upper cervical  spine: No focal marrow lesion Sinuses/Orbits: Visualized orbits demonstrate no acute abnormality. No significant paranasal sinus disease or mastoid effusion. MRA HEAD FINDINGS The intracranial internal carotid arteries are patent. Symmetric apparent paucity of flow related signal within the bilateral internal carotid arteries at the level of the skull base is suspected artifactual given symmetry. Stenoses at these sites cannot be definitively excluded. Otherwise, no significant stenosis within these vessels. The M1 middle cerebral arteries are patent bilaterally without significant stenosis. No M2 proximal branch occlusion or high-grade proximal arterial stenosis is identified. The bilateral anterior cerebral arteries are patent without high-grade proximal stenosis. Hypoplastic A1 right ACA. 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 Diminutive vertebrobasilar system likely related to fetal origin posterior cerebral arteries bilaterally. The intracranial vertebral arteries are patent without significant stenosis. The basilar artery is patent without significant stenosis. The posterior cerebral arteries are patent without high-grade proximal stenosis. IMPRESSION: MRI brain: 1. Mildly motion degraded study. 2. No evidence of acute intracranial abnormality, including acute infarct. 3. No abnormal enhancement demonstrated to suggest intracranial metastatic disease. 4. Mild chronic small vessel ischemic disease. 5. Small chronic lacunar infarcts within the cerebellar white matter bilaterally. 6. Moderate generalized parenchymal atrophy. MRA head: 1. Symmetric apparent paucity of flow related signal within the bilateral internal carotid arteries at the level of the skull base is suspected artifactual given symmetry. Stenoses at these sites cannot be definitively excluded. 2. Otherwise, no intracranial high-grade proximal  arterial stenosis is demonstrated. 3. 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 4. Fetal origin posterior communicating arteries bilaterally. Electronically Signed   By: Kellie Simmering DO   On: 04/03/2019 12:36   MR THORACIC SPINE W WO CONTRAST  Result Date: 03/19/2019 CLINICAL DATA:  79 year old male with history of probable metastatic prostate cancer, T12 destructive lesion with compression fracture. EXAM: MRI THORACIC AND LUMBAR SPINE WITHOUT AND WITH CONTRAST TECHNIQUE: Multiplanar and multiecho pulse sequences of the thoracic and lumbar spine were obtained without and with intravenous contrast. CONTRAST:  23mL GADAVIST GADOBUTROL 1 MMOL/ML IV SOLN COMPARISON:  Comparison made with prior CT from 02/15/2019 as well as prior bone scan from 03/03/2019. FINDINGS: MRI THORACIC SPINE FINDINGS Alignment: Vertebral bodies normally aligned with preservation of the normal thoracic kyphosis. No listhesis or subluxation. Vertebrae: Destructive metastatic lesion seen involving the T12 vertebral body, corresponding with findings seen on previous examinations. There is an associated pathologic compression fracture with mild 20% height loss. Mild convex bowing of the posterior margin of the vertebral body related to tumor without significant bony retropulsion. No significant stenosis. Lesion does partially extend into the right pedicle of T12 (series 21, image 5). No epidural or significant paraspinous extension. No other metastatic lesions seen elsewhere within the thoracic spine. Underlying bone marrow signal intensity diffusely heterogeneous. Note made of a 13 mm benign hemangioma within the T1 vertebral body. Adjacent T1 hypointensity without associated STIR correlate or enhancement noted (series 19, image 9), indeterminate, but most likely benign. No other discrete or worrisome osseous lesions. Vertebral body  height otherwise maintained without  acute or chronic fracture. No other abnormal marrow edema or enhancement. Cord: Signal intensity within the thoracic spinal cord is normal. Normal cord caliber and morphology. No epidural tumor or abnormal enhancement. Paraspinal and other soft tissues: Paraspinous soft tissues demonstrate no acute finding. Partially visualized lungs are grossly clear. Visualized visceral structures within normal limits. Disc levels: Mild for age scattered multilevel disc bulging seen within the mid and lower thoracic spine. Scattered multilevel facet hypertrophy noted as well, most notable at T9-10 through T11-12. No significant canal or foraminal stenosis. No neural impingement. MRI LUMBAR SPINE FINDINGS Segmentation: Transitional lumbosacral anatomy with partial sacralization of the L5 vertebral body. Alignment: Mild levoscoliosis. 3 mm chronic facet mediated anterolisthesis of L4 on L5. Alignment otherwise normal with preservation of the normal lumbar lordosis. Vertebrae: Vertebral body height maintained without evidence for acute or chronic fracture. Bone marrow signal intensity diffusely heterogeneous. No discrete or worrisome osseous lesions. No abnormal marrow edema or enhancement. No other evidence for metastatic disease within the lumbar spine or visualized sacrum/pelvis. Conus medullaris: Extends to the L2 level and appears normal. No abnormal enhancement or epidural tumor. Paraspinal and other soft tissues: Paraspinous soft tissues demonstrate no acute finding. No paraspinous mass or abnormal enhancement. Enlarged 3.9 cm left external iliac node noted, corresponding with abnormality seen on prior CT. Remainder the visualized visceral structures otherwise unremarkable. Disc levels: L1-2: Normal interspace. Mild bilateral facet hypertrophy. No canal or foraminal stenosis. L2-3: Chronic intervertebral disc space narrowing with diffuse disc bulge and disc desiccation. Associated mild reactive endplate changes. Superimposed  shallow left extraforaminal disc protrusion closely approximates the exiting left L2 nerve root as it courses of the left neural foramen (series 4, image 20). Mild to moderate facet hypertrophy. No significant spinal stenosis. Foramina remain patent. L3-4: Chronic intervertebral disc space narrowing with diffuse disc bulge and disc desiccation. Superimposed left foraminal disc protrusion seen at the inferior aspect of the left neural foramen (series 4, image 26). Moderate facet and ligament flavum hypertrophy. Resultant mild spinal stenosis. Foramina remain patent. L4-5: Trace anterolisthesis. Chronic intervertebral disc space narrowing with diffuse disc bulge and disc desiccation. Reactive endplate changes with marginal endplate osteophytic spurring. Moderate to severe right worse than left facet hypertrophy with ligament flavum thickening. Resultant mild canal with bilateral lateral recess stenosis. Mild right worse than left L4 foraminal narrowing. 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 Mild disc bulge with reactive endplate changes. Moderate left greater than right facet hypertrophy. Resultant mild left greater than right lateral recess stenosis. Moderate bilateral L5 foraminal narrowing, right greater than left. IMPRESSION: 1. 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 No significant bony retropulsion or stenosis. No significant epidural or paraspinous tumor at this time. 2. No other evidence for osseous metastatic disease within the thoracic or lumbar spine. 3. 3.9 cm left external iliac node, consistent with nodal metastasis. 4. Small left foraminal to extraforaminal disc protrusions at L2-3 and L3-4, closely approximating and potentially irritating either of the exiting left L2 or L3 nerve roots respectively. 5. Multifactorial degenerative changes at L4-5 and L5-S1 with resultant mild to  moderate bilateral neural foraminal narrowing, with mild spinal stenosis at the L4-5 level. Electronically Signed   By: Jeannine Boga M.D.   On: 03/19/2019 19:12   MR Lumbar Spine W Wo Contrast  Result Date: 03/19/2019 CLINICAL DATA:  79 year old male with history of probable metastatic prostate cancer, T12 destructive  lesion with compression fracture. EXAM: MRI THORACIC AND LUMBAR SPINE WITHOUT AND WITH CONTRAST TECHNIQUE: Multiplanar and multiecho pulse sequences of the thoracic and lumbar spine were obtained without and with intravenous contrast. CONTRAST:  4mL GADAVIST GADOBUTROL 1 MMOL/ML IV SOLN COMPARISON:  Comparison made with prior CT from 02/15/2019 as well as prior bone scan from 03/03/2019. FINDINGS: MRI THORACIC SPINE FINDINGS Alignment: Vertebral bodies normally aligned with preservation of the normal thoracic kyphosis. No listhesis or subluxation. Vertebrae: Destructive metastatic lesion seen involving the T12 vertebral body, corresponding with findings seen on previous examinations. There is an associated pathologic compression fracture with mild 20% height loss. Mild convex bowing of the posterior margin of the vertebral body related to tumor without significant bony retropulsion. No significant stenosis. Lesion does partially extend into the right pedicle of T12 (series 21, image 5). No epidural or significant paraspinous extension. No other metastatic lesions seen elsewhere within the thoracic spine. Underlying bone marrow signal intensity diffusely heterogeneous. Note made of a 13 mm benign hemangioma within the T1 vertebral body. Adjacent T1 hypointensity without associated STIR correlate or enhancement noted (series 19, image 9), indeterminate, but most likely benign. No other discrete or worrisome osseous lesions. Vertebral body height otherwise maintained without acute or chronic fracture. No other abnormal marrow edema or enhancement. Cord: Signal intensity within the thoracic  spinal cord is normal. Normal cord caliber and morphology. No epidural tumor or abnormal enhancement. Paraspinal and other soft tissues: Paraspinous soft tissues demonstrate no acute finding. Partially visualized lungs are grossly clear. Visualized visceral structures within normal limits. Disc levels: Mild for age scattered multilevel disc bulging seen within the mid and lower thoracic spine. Scattered multilevel facet hypertrophy noted as well, most notable at T9-10 through T11-12. No significant canal or foraminal stenosis. No neural impingement. MRI LUMBAR SPINE FINDINGS Segmentation: Transitional lumbosacral anatomy with partial sacralization of the L5 vertebral body. Alignment: Mild levoscoliosis. 3 mm chronic facet mediated anterolisthesis of L4 on L5. Alignment otherwise normal with preservation of the normal lumbar lordosis. Vertebrae: Vertebral body height maintained without evidence for acute or chronic fracture. Bone marrow signal intensity diffusely heterogeneous. No discrete or worrisome osseous lesions. No abnormal marrow edema or enhancement. No other evidence for metastatic disease within the lumbar spine or visualized sacrum/pelvis. Conus medullaris: Extends to the L2 level and appears normal. No abnormal enhancement or epidural tumor. Paraspinal and other soft tissues: Paraspinous soft tissues demonstrate no acute finding. No paraspinous mass or abnormal enhancement. Enlarged 3.9 cm left external iliac node noted, corresponding with abnormality seen on prior CT. Remainder the visualized visceral structures otherwise unremarkable. Disc levels: L1-2: Normal interspace. Mild bilateral facet hypertrophy. No canal or foraminal stenosis. L2-3: Chronic intervertebral disc space narrowing with diffuse disc bulge and disc desiccation. Associated mild reactive endplate changes. Superimposed shallow left extraforaminal disc protrusion closely approximates the exiting left L2 nerve root as it courses of the  left neural foramen (series 4, image 20). Mild to moderate facet hypertrophy. No significant spinal stenosis. Foramina remain patent. L3-4: Chronic intervertebral disc space narrowing with diffuse disc bulge and disc desiccation. Superimposed left foraminal disc protrusion seen at the inferior aspect of the left neural foramen (series 4, image 26). Moderate facet and ligament flavum hypertrophy. Resultant mild spinal stenosis. Foramina remain patent. L4-5: Trace anterolisthesis. Chronic intervertebral disc space narrowing with diffuse disc bulge and disc desiccation. Reactive endplate changes with marginal endplate osteophytic spurring. Moderate to severe right worse than left facet hypertrophy with ligament flavum thickening. Resultant mild  canal with bilateral lateral recess stenosis. Mild right worse than left L4 foraminal narrowing. 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 Mild disc bulge with reactive endplate changes. Moderate left greater than right facet hypertrophy. Resultant mild left greater than right lateral recess stenosis. Moderate bilateral L5 foraminal narrowing, right greater than left. IMPRESSION: 1. 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 No significant bony retropulsion or stenosis. No significant epidural or paraspinous tumor at this time. 2. No other evidence for osseous metastatic disease within the thoracic or lumbar spine. 3. 3.9 cm left external iliac node, consistent with nodal metastasis. 4. Small left foraminal to extraforaminal disc protrusions at L2-3 and L3-4, closely approximating and potentially irritating either of the exiting left L2 or L3 nerve roots respectively. 5. Multifactorial degenerative changes at L4-5 and L5-S1 with resultant mild to moderate bilateral neural foraminal narrowing, with mild spinal stenosis at the L4-5 level. Electronically Signed    By: Jeannine Boga M.D.   On: 03/19/2019 19:12   IR Bone Tumor(s)RF Ablation  Result Date: 03/28/2019 INDICATION: 79 year old male with a history of lifestyle limiting pain at the site of T12 metastatic disease, suspected prostate carcinoma. EXAM: IMAGE GUIDED RADIOFREQUENCY ABLATION T12 IMAGE GUIDED BONE BIOPSY VERTEBRAL AUGMENTATION WITH KYPHOPLASTY COMPARISON:  MR 03/19/2019 MEDICATIONS: 2 G ANCEF; The antibiotic was administered in an appropriate time interval prior to needle puncture of the skin. ANESTHESIA/SEDATION: 3.5 mg Versed, 175 mcg fentanyl, 30 mg IV Toradol Moderate Sedation Time:  55 minutes The patient was continuously monitored during the procedure by the interventional radiology nurse under my direct supervision. FLUOROSCOPY TIME:  Fluoroscopy Time: 6 minutes 24 seconds (434 mGy). COMPLICATIONS: None TECHNIQUE: Following a full explanation of the procedure along with the potentially associated complications, a witnessed informed consent was obtained. Specific risks that were discussed included bleeding, infection, injury to adjacent structures, neurologic injury, embolization of cement within the veins, failure of the procedure to improve pain, need for further procedure/ surgery, cardiopulmonary collapse, death. The patient understands the risks and wishes to proceed. The patient was placed prone on the fluoroscopic table. Nasal oxygen was administered. Physiologic monitoring was performed throughout the duration of the procedure. The skin overlying the T12 region was prepped and draped in the usual sterile fashion. The T12 vertebral body was identified and the right pedicle was infiltrated with 1% lidocaine. This was then followed by the advancement of an Medtronic Osteocool Trocar needle through the right pedicle into the posterior third of the vertebral body. Bone biopsy was then performed placed into formalin. The bone auger was then advanced. The left pedicle was then infiltrated  with 1% lidocaine. A small incision was made with 11 blade scalpel, and then a Medtronic Osteocool Trocar needle was advanced through the left pedicle into the posterior 1/3 of the vertebral body. Bone auger was advanced. We elected to use 20 mm RF probes, which were placed through the bilateral cannula. Once position was confirmed, ablation was initiated for total of approximately 12 minutes. The RFA ablation devices were then removed. Balloon devices were advanced bilaterally. Simultaneous inflation of the left and right pedicular cannula balloons was performed under fluoroscopic observation. Methylmethacrylate mixture was then reconstituted. Under biplane intermittent fluoroscopy, the methylmethacrylate was then injected into the cavity of T12 vertebral body with filling of the space. No extravasation was noted posteriorly into the spinal canal. No epidural venous contamination was seen. The needles were then removed. Hemostasis was  achieved at the skin entry site. There were no acute complications. Patient tolerated the procedure well. The patient was observed for 30 minutes and returned to room in good condition. FINDINGS: Bilateral transpedicular approach to the T12 level for biopsy, RF ablation, and treatment with vertebral augmentation. IMPRESSION: Status post image guided biopsy and treatment of T12 pathologic fracture with RF ablation and vertebral augmentation with kyphoplasty technique via bilateral trans pedicular approach. Signed, Dulcy Fanny. Dellia Nims, RPVI [facility] Electronically Signed   By: Corrie Mckusick D.O.   On: 03/28/2019 10:37   IR KYPHO THORACIC WITH BONE BIOPSY  Result Date: 03/28/2019 INDICATION: 79 year old male with a history of lifestyle limiting pain at the site of T12 metastatic disease, suspected prostate carcinoma. EXAM: IMAGE GUIDED RADIOFREQUENCY ABLATION T12 IMAGE GUIDED BONE BIOPSY VERTEBRAL AUGMENTATION WITH  KYPHOPLASTY COMPARISON:  MR 03/19/2019 MEDICATIONS: 2 G ANCEF; The antibiotic was administered in an appropriate time interval prior to needle puncture of the skin. ANESTHESIA/SEDATION: 3.5 mg Versed, 175 mcg fentanyl, 30 mg IV Toradol Moderate Sedation Time:  55 minutes The patient was continuously monitored during the procedure by the interventional radiology nurse under my direct supervision. FLUOROSCOPY TIME:  Fluoroscopy Time: 6 minutes 24 seconds (434 mGy). COMPLICATIONS: None TECHNIQUE: Following a full explanation of the procedure along with the potentially associated complications, a witnessed informed consent was obtained. Specific risks that were discussed included bleeding, infection, injury to adjacent structures, neurologic injury, embolization of cement within the veins, failure of the procedure to improve pain, need for further procedure/ surgery, cardiopulmonary collapse, death. The patient understands the risks and wishes to proceed. The patient was placed prone on the fluoroscopic table. Nasal oxygen was administered. Physiologic monitoring was performed throughout the duration of the procedure. The skin overlying the T12 region was prepped and draped in the usual sterile fashion. The T12 vertebral body was identified and the right pedicle was infiltrated with 1% lidocaine. This was then followed by the advancement of an Medtronic Osteocool Trocar needle through the right pedicle into the posterior third of the vertebral body. Bone biopsy was then performed placed into formalin. The bone auger was then advanced. The left pedicle was then infiltrated with 1% lidocaine. A small incision was made with 11 blade scalpel, and then a Medtronic Osteocool Trocar needle was advanced through the left pedicle into the posterior 1/3 of the vertebral body. Bone auger was advanced. We elected to use 20 mm RF probes, which were placed through the bilateral cannula. Once position was confirmed, ablation was  initiated for total of approximately 12 minutes. The RFA ablation devices were then removed. Balloon devices were advanced bilaterally. Simultaneous inflation of the left and right pedicular cannula balloons was performed under fluoroscopic observation. Methylmethacrylate mixture was then reconstituted. Under biplane intermittent fluoroscopy, the methylmethacrylate was then injected into the cavity of T12 vertebral body with filling of the space. No extravasation was noted posteriorly into the spinal canal. No epidural venous contamination was seen. The needles were then removed. Hemostasis was achieved at the skin entry site. There were no acute complications. Patient tolerated the procedure well. The patient was observed for 30 minutes and returned to room in good condition. FINDINGS: Bilateral transpedicular approach to the T12 level for biopsy, RF ablation, and treatment with vertebral augmentation. IMPRESSION: Status post image guided biopsy and treatment of T12 pathologic fracture with RF ablation and vertebral augmentation with kyphoplasty technique via bilateral trans pedicular approach. Signed, Dulcy Fanny. Earleen Newport, DO, RPVI  [facility] Electronically Signed   By: Corrie Mckusick D.O.   On: 03/28/2019 10:37   IR Radiologist Eval & Mgmt  Result Date: 04/11/2019 Please refer to notes tab for details about interventional procedure. (Op Note)  IR Radiologist Eval & Mgmt  Result Date: 03/21/2019 Please refer to notes tab for details about interventional procedure. (Op Note)   ASSESSMENT: Stage IV prostate cancer.  PLAN:    1.  Stage IV prostate cancer: Biopsy and subsequent RFA of the T12 bony lesion confirmed metastatic prostate cancer.  Patient's most recent PSA 213.0.  Proceed with 45 mg Eligard today.  No other interventions or treatments are needed at this time.  Return to clinic in 3 months with laboratory work only and then in 6 months for  laboratory work, further evaluation, and continuation of treatment. 2.  Back pain: Significantly improved.  RFA as above.  Patient was given a refill of Vicodin today.   I spent a total of 30 minutes reviewing chart data, face-to-face evaluation with the patient, counseling and coordination of care as detailed above.   Patient expressed understanding and was in agreement with this plan. He also understands that He can call clinic at any time with any questions, concerns, or complaints.   Cancer Staging No matching staging information was found for the patient.  Lloyd Huger, MD   04/13/2019 6:25 AM

## 2019-04-10 ENCOUNTER — Other Ambulatory Visit: Payer: Self-pay | Admitting: Emergency Medicine

## 2019-04-10 DIAGNOSIS — C61 Malignant neoplasm of prostate: Secondary | ICD-10-CM

## 2019-04-11 ENCOUNTER — Ambulatory Visit
Admission: RE | Admit: 2019-04-11 | Discharge: 2019-04-11 | Disposition: A | Discharge status: Home / Self Care | Payer: Medicare Other | Source: Ambulatory Visit | Attending: Interventional Radiology | Admitting: Interventional Radiology

## 2019-04-11 ENCOUNTER — Other Ambulatory Visit: Payer: Self-pay

## 2019-04-11 ENCOUNTER — Inpatient Hospital Stay (HOSPITAL_BASED_OUTPATIENT_CLINIC_OR_DEPARTMENT_OTHER): Payer: Medicare Other | Admitting: Oncology

## 2019-04-11 ENCOUNTER — Inpatient Hospital Stay: Payer: Medicare Other

## 2019-04-11 ENCOUNTER — Encounter: Payer: Self-pay | Admitting: Oncology

## 2019-04-11 VITALS — BP 134/82 | HR 68 | Temp 98.3°F | Resp 20 | Wt 259.0 lb

## 2019-04-11 DIAGNOSIS — C61 Malignant neoplasm of prostate: Secondary | ICD-10-CM

## 2019-04-11 DIAGNOSIS — C7951 Secondary malignant neoplasm of bone: Secondary | ICD-10-CM | POA: Diagnosis not present

## 2019-04-11 DIAGNOSIS — S22000A Wedge compression fracture of unspecified thoracic vertebra, initial encounter for closed fracture: Secondary | ICD-10-CM

## 2019-04-11 HISTORY — PX: IR RADIOLOGIST EVAL & MGMT: IMG5224

## 2019-04-11 LAB — COMPREHENSIVE METABOLIC PANEL
ALT: 28 U/L (ref 0–44)
AST: 27 U/L (ref 15–41)
Albumin: 3.7 g/dL (ref 3.5–5.0)
Alkaline Phosphatase: 42 U/L (ref 38–126)
Anion gap: 8 (ref 5–15)
BUN: 13 mg/dL (ref 8–23)
CO2: 26 mmol/L (ref 22–32)
Calcium: 9.1 mg/dL (ref 8.9–10.3)
Chloride: 105 mmol/L (ref 98–111)
Creatinine, Ser: 0.88 mg/dL (ref 0.61–1.24)
GFR calc Af Amer: >60 mL/min (ref >60)
GFR calc non Af Amer: >60 mL/min (ref >60)
Glucose, Bld: 99 mg/dL (ref 70–99)
Potassium: 4.7 mmol/L (ref 3.5–5.1)
Sodium: 139 mmol/L (ref 135–145)
Total Bilirubin: 0.8 mg/dL (ref 0.3–1.2)
Total Protein: 7.3 g/dL (ref 6.5–8.1)

## 2019-04-11 LAB — CBC WITH DIFFERENTIAL/PLATELET
Abs Immature Granulocytes: 0.01 10*3/uL (ref 0.00–0.07)
Basophils Absolute: 0 10*3/uL (ref 0.0–0.1)
Basophils Relative: 1 %
Eosinophils Absolute: 0.2 10*3/uL (ref 0.0–0.5)
Eosinophils Relative: 3 %
HCT: 37.6 % — ABNORMAL LOW (ref 39.0–52.0)
Hemoglobin: 12 g/dL — ABNORMAL LOW (ref 13.0–17.0)
Immature Granulocytes: 0 %
Lymphocytes Relative: 39 %
Lymphs Abs: 2.5 10*3/uL (ref 0.7–4.0)
MCH: 30.9 pg (ref 26.0–34.0)
MCHC: 31.9 g/dL (ref 30.0–36.0)
MCV: 96.9 fL (ref 80.0–100.0)
Monocytes Absolute: 0.6 10*3/uL (ref 0.1–1.0)
Monocytes Relative: 10 %
Neutro Abs: 3 10*3/uL (ref 1.7–7.7)
Neutrophils Relative %: 47 %
Platelets: 268 10*3/uL (ref 150–400)
RBC: 3.88 MIL/uL — ABNORMAL LOW (ref 4.22–5.81)
RDW: 13.7 % (ref 11.5–15.5)
WBC: 6.2 10*3/uL (ref 4.0–10.5)
nRBC: 0 % (ref 0.0–0.2)

## 2019-04-11 LAB — PSA: Prostatic Specific Antigen: 213 ng/mL — ABNORMAL HIGH (ref 0.00–4.00)

## 2019-04-11 MED ORDER — LEUPROLIDE ACETATE (6 MONTH) 45 MG ~~LOC~~ KIT
45.0000 mg | PACK | Freq: Once | SUBCUTANEOUS | Status: AC
  Administered 2019-04-11: 45 mg via SUBCUTANEOUS
  Filled 2019-04-11: qty 45

## 2019-04-11 NOTE — Progress Notes (Signed)
Patient would like refill on pain medication. Also would like info on Lupron shot and [facility]

## 2019-04-11 NOTE — Progress Notes (Signed)
Chief Complaint: T12 metastatic lesion, SP RFA and vertebral augmentation    Referring Physician(s): Dr. Grayland Ormond  History of Present Illness: Evan Gonzalez is a 79 y.o. male presenting as a scheduled follow up to [place] [facility], SP RFA of T12 metastatic lesion with Osteocool, with biopsy and KP performed.   He joins [place] today by telemedicine visit, given the COVID situation.    He tells me that after our procedure he was recovering fine, but did have to go to the emergency room at [facility] to be evaluated for dizziness.  This was 04/03/2019.  At that time, he feels like the subsequent MRI and the bedrest during the ED stay exacerbated his back pain, but he tells me he is much better at this point.   He tells me that he is "90%" better from the standpoint of discomfort.  He has been taking tylenol for any break-through pain.  He is able to complete his daily activities.  Biopsy that we performed has result that shows "metastatic adenocarcinoma, compatible with prostate adenocarcinoma."   He tells me he has started therapy with Dr. Grayland Ormond.     Past Medical History:  Diagnosis Date  . Diabetes mellitus without complication ([place])   . Glaucoma   . Hyperlipidemia   . Hypertension     Past Surgical History:  Procedure Laterality Date  . EYE SURGERY    . IR BONE TUMOR(S)RF ABLATION  03/28/2019  . IR KYPHO THORACIC WITH BONE BIOPSY  03/28/2019  . IR RADIOLOGIST EVAL & MGMT  03/21/2019    Allergies: Patient has no known allergies.  Medications: Prior to Admission medications   Medication Sig Start Date End Date Taking? Authorizing Provider  acetaminophen (TYLENOL) 650 MG CR tablet Take 650 mg by mouth 2 (two) times daily.    [provider]  benzonatate (TESSALON) 200 MG capsule Take 1 capsule (200 mg total) by mouth 3 (three) times daily as needed for cough. Patient not taking: Reported on 04/11/2019 02/07/17   Melynda Ripple, MD  Brinzolamide-Brimonidine [facility])  1-0.2 % SUSP Simbrinza 1 %-0.2 % eye drops,suspension    [provider]  dipyridamole-aspirin (AGGRENOX) 200-25 MG 12hr capsule Take 1 capsule by mouth 2 (two) times daily.    [provider]  dorzolamide (TRUSOPT) 2 % ophthalmic solution Place 1 drop into both eyes 2 (two) times daily.    [provider]  fluticasone (FLONASE) 50 MCG/ACT nasal spray Place 2 sprays into both nostrils daily. Patient not taking: Reported on 04/11/2019 02/07/17   Melynda Ripple, MD  glipiZIDE (GLUCOTROL) 5 MG tablet Take 5 mg by mouth daily before breakfast.    [provider]  HYDROcodone-acetaminophen (NORCO) 5-325 MG tablet Take 1 tablet by mouth every 6 (six) hours as needed for moderate pain. Take 2 at night as needed. 03/13/19   Lloyd Huger, MD  latanoprost (XALATAN) 0.005 % ophthalmic solution Place 1 drop into both eyes at bedtime.    [provider]  lisinopril (PRINIVIL,ZESTRIL) 5 MG tablet Take 5 mg by mouth daily as needed (hypertension).     [provider]  meclizine (ANTIVERT) 25 MG tablet Take 1 tablet (25 mg total) by mouth 3 (three) times daily as needed for dizziness or nausea. 04/03/19   Carrie Mew, MD  metFORMIN (GLUCOPHAGE) 500 MG tablet Take 500 mg by mouth 2 (two) times daily with a meal.    [provider]  ondansetron (ZOFRAN ODT) 4 MG disintegrating tablet Take 1 tablet (  4 mg total) by mouth every 8 (eight) hours as needed for nausea or vomiting. 04/03/19   Carrie Mew, MD  simvastatin (ZOCOR) 20 MG tablet Take 20 mg by mouth at bedtime.    [provider]     Family History  Problem Relation Age of Onset  . Heart attack Mother   . Diabetes Father     Social History   Socioeconomic History  . Marital status: Single    Spouse name: Not on file  . Number of children: Not on file  . Years of education: Not on file  . Highest education level: Not on file  Occupational History  . Not on file    Tobacco Use  . Smoking status: Former Smoker    Quit date: 02/08/1987    Years since quitting: 32.1  . Smokeless tobacco: Never Used  Substance and Sexual Activity  . Alcohol use: No  . Drug use: No  . Sexual activity: Not on file  Other Topics Concern  . Not on file  Social History Narrative  . Not on file   Social Determinants of Health   Financial Resource Strain:   . Difficulty of Paying Living Expenses: Not on file  Food Insecurity:   . Worried About Charity fundraiser in the Last Year: Not on file  . Ran Out of Food in the Last Year: Not on file  Transportation Needs:   . Lack of Transportation (Medical): Not on file  . Lack of Transportation (Non-Medical): Not on file  Physical Activity:   . Days of Exercise per Week: Not on file  . Minutes of Exercise per Session: Not on file  Stress:   . Feeling of Stress : Not on file  Social Connections:   . Frequency of Communication with Friends and Family: Not on file  . Frequency of Social Gatherings with Friends and Family: Not on file  . Attends Religious Services: Not on file  . Active Member of Clubs or Organizations: Not on file  . Attends Archivist Meetings: Not on file  . Marital Status: Not on file     Review of Systems  Review of Systems: A 12 point ROS discussed and pertinent positives are indicated in the HPI above.  All other systems are negative.  Physical Exam No direct physical exam was performed (except for noted visual exam findings with Video Visits).    Vital Signs: There were no vitals taken for this visit.  Imaging: Evan ANGIO HEAD WO CONTRAST  Result Date: 04/03/2019 CLINICAL DATA:  Ataxia, stroke suspected, metastatic prostate cancer, history of CVA. Headache, sudden, carotid/vertebral artery dissection suspected. EXAM: MRI HEAD WITHOUT AND WITH CONTRAST MRA HEAD WITHOUT CONTRAST TECHNIQUE: Multiplanar, multiecho pulse sequences of the brain and surrounding structures were obtained  without and with intravenous contrast. Angiographic images of the head were obtained using MRA technique without contrast. CONTRAST:  59m GADAVIST GADOBUTROL 1 MMOL/ML IV SOLN COMPARISON:  Head CT 09/24/2017 FINDINGS: MRI HEAD FINDINGS Brain: Multiple sequences are mildly motion degraded. There is no evidence of acute infarct. No evidence of intracranial mass. No midline shift or extra-axial fluid collection. No definite chronic intracranial blood products. Mild scattered T2/FLAIR hyperintensity within the cerebral white matter is nonspecific, but consistent with chronic small vessel ischemic disease. There are small chronic lacunar infarcts within the bilateral cerebral white matter (series 7, image 9). Moderate generalized parenchymal atrophy. No abnormal intracranial enhancement is identified. Vascular: Reported separately Skull and upper cervical spine:  No focal marrow lesion Sinuses/Orbits: Visualized orbits demonstrate no acute abnormality. No significant paranasal sinus disease or mastoid effusion. MRA HEAD FINDINGS The intracranial internal carotid arteries are patent. Symmetric apparent paucity of flow related signal within the bilateral internal carotid arteries at the level of the skull base is suspected artifactual given symmetry. Stenoses at these sites cannot be definitively excluded. Otherwise, no significant stenosis within these vessels. The M1 middle cerebral arteries are patent bilaterally without significant stenosis. No M2 proximal branch occlusion or high-grade proximal arterial stenosis is identified. The bilateral anterior cerebral arteries are patent without high-grade proximal stenosis. Hypoplastic A1 right ACA. 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 Diminutive vertebrobasilar system likely related to fetal origin posterior cerebral arteries bilaterally. The intracranial vertebral arteries  are patent without significant stenosis. The basilar artery is patent without significant stenosis. The posterior cerebral arteries are patent without high-grade proximal stenosis. IMPRESSION: MRI brain: 1. Mildly motion degraded study. 2. No evidence of acute intracranial abnormality, including acute infarct. 3. No abnormal enhancement demonstrated to suggest intracranial metastatic disease. 4. Mild chronic small vessel ischemic disease. 5. Small chronic lacunar infarcts within the cerebellar white matter bilaterally. 6. Moderate generalized parenchymal atrophy. MRA head: 1. Symmetric apparent paucity of flow related signal within the bilateral internal carotid arteries at the level of the skull base is suspected artifactual given symmetry. Stenoses at these sites cannot be definitively excluded. 2. Otherwise, no intracranial high-grade proximal arterial stenosis is demonstrated. 3. 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 4. Fetal origin posterior communicating arteries bilaterally. Electronically Signed   By: Kellie Simmering DO   On: 04/03/2019 12:36   Evan Angiogram Neck W or Wo Contrast  Result Date: 04/03/2019 EXAM: MRA NECK WITHOUT AND WITH CONTRAST TECHNIQUE: Multiplanar and multiecho pulse sequences of the neck were obtained without and with intravenous contrast. Angiographic images of the neck were obtained using MRA technique without and with intravenous contrast. CONTRAST:  101m GADAVIST GADOBUTROL 1 MMOL/ML IV SOLN COMPARISON:  No pertinent prior studies available for comparison. FINDINGS: Standard aortic branching. The bilateral common and internal carotid arteries are patent within the neck without significant (50% or greater). The apparent symmetric stenoses of the internal carotid arteries at the level of the skull base demonstrated on concurrently performed noncontrast MRA of the head are confirmed artifactual. No  significant stenosis at these sites. The bilateral cervical vertebral arteries are developmentally diminutive, although patent throughout the neck without stenosis. No evidence of dissection or aneurysm. IMPRESSION: The bilateral common carotid, internal carotid and vertebral arteries are patent within the neck without significant stenosis. No evidence of dissection or aneurysm. The apparent symmetric stenoses of the internal carotid arteries at the level of the skull base demonstrated on concurrently performed non-contrast MRA of the head are confirmed artifactual. No significant stenosis at these sites. Electronically Signed   By: KKellie SimmeringDO   On: 04/03/2019 12:42   Evan Brain W and Wo Contrast  Result Date: 04/03/2019 CLINICAL DATA:  Ataxia, stroke suspected, metastatic prostate cancer, history of CVA. Headache, sudden, carotid/vertebral artery dissection suspected. EXAM: MRI HEAD WITHOUT AND WITH CONTRAST MRA HEAD WITHOUT CONTRAST TECHNIQUE: Multiplanar, multiecho pulse sequences of the brain and surrounding structures were obtained without and with intravenous contrast. Angiographic images of the head were obtained using MRA technique without contrast. CONTRAST:  162mGADAVIST GADOBUTROL 1 MMOL/ML  IV SOLN COMPARISON:  Head CT 09/24/2017 FINDINGS: MRI HEAD FINDINGS Brain: Multiple sequences are mildly motion degraded. There is no evidence of acute infarct. No evidence of intracranial mass. No midline shift or extra-axial fluid collection. No definite chronic intracranial blood products. Mild scattered T2/FLAIR hyperintensity within the cerebral white matter is nonspecific, but consistent with chronic small vessel ischemic disease. There are small chronic lacunar infarcts within the bilateral cerebral white matter (series 7, image 9). Moderate generalized parenchymal atrophy. No abnormal intracranial enhancement is identified. Vascular: Reported separately Skull and upper cervical spine: No focal marrow  lesion Sinuses/Orbits: Visualized orbits demonstrate no acute abnormality. No significant paranasal sinus disease or mastoid effusion. MRA HEAD FINDINGS The intracranial internal carotid arteries are patent. Symmetric apparent paucity of flow related signal within the bilateral internal carotid arteries at the level of the skull base is suspected artifactual given symmetry. Stenoses at these sites cannot be definitively excluded. Otherwise, no significant stenosis within these vessels. The M1 middle cerebral arteries are patent bilaterally without significant stenosis. No M2 proximal branch occlusion or high-grade proximal arterial stenosis is identified. The bilateral anterior cerebral arteries are patent without high-grade proximal stenosis. Hypoplastic A1 right ACA. 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 Diminutive vertebrobasilar system likely related to fetal origin posterior cerebral arteries bilaterally. The intracranial vertebral arteries are patent without significant stenosis. The basilar artery is patent without significant stenosis. The posterior cerebral arteries are patent without high-grade proximal stenosis. IMPRESSION: MRI brain: 1. Mildly motion degraded study. 2. No evidence of acute intracranial abnormality, including acute infarct. 3. No abnormal enhancement demonstrated to suggest intracranial metastatic disease. 4. Mild chronic small vessel ischemic disease. 5. Small chronic lacunar infarcts within the cerebellar white matter bilaterally. 6. Moderate generalized parenchymal atrophy. MRA head: 1. Symmetric apparent paucity of flow related signal within the bilateral internal carotid arteries at the level of the skull base is suspected artifactual given symmetry. Stenoses at these sites cannot be definitively excluded. 2. Otherwise, no intracranial high-grade proximal arterial stenosis is  demonstrated. 3. 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 4. Fetal origin posterior communicating arteries bilaterally. Electronically Signed   By: Kellie Simmering DO   On: 04/03/2019 12:36   Evan THORACIC SPINE W WO CONTRAST  Result Date: 03/19/2019 CLINICAL DATA:  79 year old male with history of probable metastatic prostate cancer, T12 destructive lesion with compression fracture. EXAM: MRI THORACIC AND LUMBAR SPINE WITHOUT AND WITH CONTRAST TECHNIQUE: Multiplanar and multiecho pulse sequences of the thoracic and lumbar spine were obtained without and with intravenous contrast. CONTRAST:  17m GADAVIST GADOBUTROL 1 MMOL/ML IV SOLN COMPARISON:  Comparison made with prior CT from 02/15/2019 as well as prior bone scan from 03/03/2019. FINDINGS: MRI THORACIC SPINE FINDINGS Alignment: Vertebral bodies normally aligned with preservation of the normal thoracic kyphosis. No listhesis or subluxation. Vertebrae: Destructive metastatic lesion seen involving the T12 vertebral body, corresponding with findings seen on previous examinations. There is an associated pathologic compression fracture with mild 20% height loss. Mild convex bowing of the posterior margin of the vertebral body related to tumor without significant bony retropulsion. No significant stenosis. Lesion does partially extend into the right pedicle of T12 (series 21, image 5). No epidural or significant paraspinous extension. No other metastatic lesions seen elsewhere within the thoracic spine. Underlying bone marrow signal intensity diffusely heterogeneous. Note made of a 13 mm  benign hemangioma within the T1 vertebral body. Adjacent T1 hypointensity without associated STIR correlate or enhancement noted (series 19, image 9), indeterminate, but most likely benign. No other discrete or worrisome osseous lesions. Vertebral body height otherwise maintained without acute or chronic  fracture. No other abnormal marrow edema or enhancement. Cord: Signal intensity within the thoracic spinal cord is normal. Normal cord caliber and morphology. No epidural tumor or abnormal enhancement. Paraspinal and other soft tissues: Paraspinous soft tissues demonstrate no acute finding. Partially visualized lungs are grossly clear. Visualized visceral structures within normal limits. Disc levels: Mild for age scattered multilevel disc bulging seen within the mid and lower thoracic spine. Scattered multilevel facet hypertrophy noted as well, most notable at T9-10 through T11-12. No significant canal or foraminal stenosis. No neural impingement. MRI LUMBAR SPINE FINDINGS Segmentation: Transitional lumbosacral anatomy with partial sacralization of the L5 vertebral body. Alignment: Mild levoscoliosis. 3 mm chronic facet mediated anterolisthesis of L4 on L5. Alignment otherwise normal with preservation of the normal lumbar lordosis. Vertebrae: Vertebral body height maintained without evidence for acute or chronic fracture. Bone marrow signal intensity diffusely heterogeneous. No discrete or worrisome osseous lesions. No abnormal marrow edema or enhancement. No other evidence for metastatic disease within the lumbar spine or visualized sacrum/pelvis. Conus medullaris: Extends to the L2 level and appears normal. No abnormal enhancement or epidural tumor. Paraspinal and other soft tissues: Paraspinous soft tissues demonstrate no acute finding. No paraspinous mass or abnormal enhancement. Enlarged 3.9 cm left external iliac node noted, corresponding with abnormality seen on prior CT. Remainder the visualized visceral structures otherwise unremarkable. Disc levels: L1-2: Normal interspace. Mild bilateral facet hypertrophy. No canal or foraminal stenosis. L2-3: Chronic intervertebral disc space narrowing with diffuse disc bulge and disc desiccation. Associated mild reactive endplate changes. Superimposed shallow left  extraforaminal disc protrusion closely approximates the exiting left L2 nerve root as it courses of the left neural foramen (series 4, image 20). Mild to moderate facet hypertrophy. No significant spinal stenosis. Foramina remain patent. L3-4: Chronic intervertebral disc space narrowing with diffuse disc bulge and disc desiccation. Superimposed left foraminal disc protrusion seen at the inferior aspect of the left neural foramen (series 4, image 26). Moderate facet and ligament flavum hypertrophy. Resultant mild spinal stenosis. Foramina remain patent. L4-5: Trace anterolisthesis. Chronic intervertebral disc space narrowing with diffuse disc bulge and disc desiccation. Reactive endplate changes with marginal endplate osteophytic spurring. Moderate to severe right worse than left facet hypertrophy with ligament flavum thickening. Resultant mild canal with bilateral lateral recess stenosis. Mild right worse than left L4 foraminal narrowing. 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 Mild disc bulge with reactive endplate changes. Moderate left greater than right facet hypertrophy. Resultant mild left greater than right lateral recess stenosis. Moderate bilateral L5 foraminal narrowing, right greater than left. IMPRESSION: 1. 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 No significant bony retropulsion or stenosis. No significant epidural or paraspinous tumor at this time. 2. No other evidence for osseous metastatic disease within the thoracic or lumbar spine. 3. 3.9 cm left external iliac node, consistent with nodal metastasis. 4. Small left foraminal to extraforaminal disc protrusions at L2-3 and L3-4, closely approximating and potentially irritating either of the exiting left L2 or L3 nerve roots respectively. 5. Multifactorial degenerative changes at L4-5 and L5-S1 with resultant mild to moderate  bilateral neural foraminal narrowing, with mild spinal stenosis at the L4-5 level. Electronically Signed   By:  Jeannine Boga M.D.   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 EXAM: MRI THORACIC AND LUMBAR SPINE WITHOUT AND WITH CONTRAST TECHNIQUE: Multiplanar and multiecho pulse sequences of the thoracic and lumbar spine were obtained without and with intravenous contrast. CONTRAST:  48m GADAVIST GADOBUTROL 1 MMOL/ML IV SOLN COMPARISON:  Comparison made with prior CT from 02/15/2019 as well as prior bone scan from 03/03/2019. FINDINGS: MRI THORACIC SPINE FINDINGS Alignment: Vertebral bodies normally aligned with preservation of the normal thoracic kyphosis. No listhesis or subluxation. Vertebrae: Destructive metastatic lesion seen involving the T12 vertebral body, corresponding with findings seen on previous examinations. There is an associated pathologic compression fracture with mild 20% height loss. Mild convex bowing of the posterior margin of the vertebral body related to tumor without significant bony retropulsion. No significant stenosis. Lesion does partially extend into the right pedicle of T12 (series 21, image 5). No epidural or significant paraspinous extension. No other metastatic lesions seen elsewhere within the thoracic spine. Underlying bone marrow signal intensity diffusely heterogeneous. Note made of a 13 mm benign hemangioma within the T1 vertebral body. Adjacent T1 hypointensity without associated STIR correlate or enhancement noted (series 19, image 9), indeterminate, but most likely benign. No other discrete or worrisome osseous lesions. Vertebral body height otherwise maintained without acute or chronic fracture. No other abnormal marrow edema or enhancement. Cord: Signal intensity within the thoracic spinal cord  is normal. Normal cord caliber and morphology. No epidural tumor or abnormal enhancement. Paraspinal and other soft tissues: Paraspinous soft tissues demonstrate no acute finding. Partially visualized lungs are grossly clear. Visualized visceral structures within normal limits. Disc levels: Mild for age scattered multilevel disc bulging seen within the mid and lower thoracic spine. Scattered multilevel facet hypertrophy noted as well, most notable at T9-10 through T11-12. No significant canal or foraminal stenosis. No neural impingement. MRI LUMBAR SPINE FINDINGS Segmentation: Transitional lumbosacral anatomy with partial sacralization of the L5 vertebral body. Alignment: Mild levoscoliosis. 3 mm chronic facet mediated anterolisthesis of L4 on L5. Alignment otherwise normal with preservation of the normal lumbar lordosis. Vertebrae: Vertebral body height maintained without evidence for acute or chronic fracture. Bone marrow signal intensity diffusely heterogeneous. No discrete or worrisome osseous lesions. No abnormal marrow edema or enhancement. No other evidence for metastatic disease within the lumbar spine or visualized sacrum/pelvis. Conus medullaris: Extends to the L2 level and appears normal. No abnormal enhancement or epidural tumor. Paraspinal and other soft tissues: Paraspinous soft tissues demonstrate no acute finding. No paraspinous mass or abnormal enhancement. Enlarged 3.9 cm left external iliac node noted, corresponding with abnormality seen on prior CT. Remainder the visualized visceral structures otherwise unremarkable. Disc levels: L1-2: Normal interspace. Mild bilateral facet hypertrophy. No canal or foraminal stenosis. L2-3: Chronic intervertebral disc space narrowing with diffuse disc bulge and disc desiccation. Associated mild reactive endplate changes. Superimposed shallow left extraforaminal disc protrusion closely approximates the exiting left L2 nerve root as it courses of the left neural  foramen (series 4, image 20). Mild to moderate facet hypertrophy. No significant spinal stenosis. Foramina remain patent. L3-4: Chronic intervertebral disc space narrowing with diffuse disc bulge and disc desiccation. Superimposed left foraminal disc protrusion seen at the inferior aspect of the left neural foramen (series 4, image 26). Moderate facet and ligament flavum hypertrophy. Resultant mild spinal stenosis. Foramina remain patent. L4-5: Trace anterolisthesis.  Chronic intervertebral disc space narrowing with diffuse disc bulge and disc desiccation. Reactive endplate changes with marginal endplate osteophytic spurring. Moderate to severe right worse than left facet hypertrophy with ligament flavum thickening. Resultant mild canal with bilateral lateral recess stenosis. Mild right worse than left L4 foraminal narrowing. 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 Mild disc bulge with reactive endplate changes. Moderate left greater than right facet hypertrophy. Resultant mild left greater than right lateral recess stenosis. Moderate bilateral L5 foraminal narrowing, right greater than left. IMPRESSION: 1. 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 No significant bony retropulsion or stenosis. No significant epidural or paraspinous tumor at this time. 2. No other evidence for osseous metastatic disease within the thoracic or lumbar spine. 3. 3.9 cm left external iliac node, consistent with nodal metastasis. 4. Small left foraminal to extraforaminal disc protrusions at L2-3 and L3-4, closely approximating and potentially irritating either of the exiting left L2 or L3 nerve roots respectively. 5. Multifactorial degenerative changes at L4-5 and L5-S1 with resultant mild to moderate bilateral neural foraminal narrowing, with mild spinal stenosis at the L4-5 level. Electronically Signed   By: Jeannine Boga M.D.   On: 03/19/2019 19:12   IR Bone Tumor(s)RF Ablation  Result Date: 03/28/2019 INDICATION: 79 year old male with a history of lifestyle limiting pain at the site of T12 metastatic disease, suspected prostate carcinoma. EXAM: IMAGE GUIDED RADIOFREQUENCY ABLATION T12 IMAGE GUIDED BONE BIOPSY VERTEBRAL AUGMENTATION WITH KYPHOPLASTY COMPARISON:  Evan 03/19/2019 MEDICATIONS: 2 G ANCEF; The antibiotic was administered in an appropriate time interval prior to needle puncture of the skin. ANESTHESIA/SEDATION: 3.5 mg Versed, 175 mcg fentanyl, 30 mg IV Toradol Moderate Sedation Time:  55 minutes The patient was continuously monitored during the procedure by the interventional radiology nurse under my direct supervision. FLUOROSCOPY TIME:  Fluoroscopy Time: 6 minutes 24 seconds (434 mGy). COMPLICATIONS: None TECHNIQUE: Following a full explanation of the procedure along with the potentially associated complications, a witnessed informed consent was obtained. Specific risks that were discussed included bleeding, infection, injury to adjacent structures, neurologic injury, embolization of cement within the veins, failure of the procedure to improve pain, need for further procedure/ surgery, cardiopulmonary collapse, death. The patient understands the risks and wishes to proceed. The patient was placed prone on the fluoroscopic table. Nasal oxygen was administered. Physiologic monitoring was performed throughout the duration of the procedure. The skin overlying the T12 region was prepped and draped in the usual sterile fashion. The T12 vertebral body was identified and the right pedicle was infiltrated with 1% lidocaine. This was then followed by the advancement of an Medtronic Osteocool Trocar needle through the right pedicle into the posterior third of the vertebral body. Bone biopsy was then performed placed into formalin. The bone auger was then advanced. The left pedicle was then infiltrated with 1%  lidocaine. A small incision was made with 11 blade scalpel, and then a Medtronic Osteocool Trocar needle was advanced through the left pedicle into the posterior 1/3 of the vertebral body. Bone auger was advanced. We elected to use 20 mm RF probes, which were placed through the bilateral cannula. Once position was confirmed, ablation was initiated for total of approximately 12 minutes. The RFA ablation devices were then removed. Balloon devices were advanced bilaterally. Simultaneous inflation of the left and right pedicular cannula balloons was performed under fluoroscopic observation. Methylmethacrylate mixture was then reconstituted. Under biplane intermittent fluoroscopy, the methylmethacrylate was then  injected into the cavity of T12 vertebral body with filling of the space. No extravasation was noted posteriorly into the spinal canal. No epidural venous contamination was seen. The needles were then removed. Hemostasis was achieved at the skin entry site. There were no acute complications. Patient tolerated the procedure well. The patient was observed for 30 minutes and returned to room in good condition. FINDINGS: Bilateral transpedicular approach to the T12 level for biopsy, RF ablation, and treatment with vertebral augmentation. IMPRESSION: Status post image guided biopsy and treatment of T12 pathologic fracture with RF ablation and vertebral augmentation with kyphoplasty technique via bilateral trans pedicular approach. Signed, Dulcy Fanny. Dellia Nims, RPVI [facility] Electronically Signed   By: Corrie Mckusick D.O.   On: 03/28/2019 10:37   IR KYPHO THORACIC WITH BONE BIOPSY  Result Date: 03/28/2019 INDICATION: 79 year old male with a history of lifestyle limiting pain at the site of T12 metastatic disease, suspected prostate carcinoma. EXAM: IMAGE GUIDED RADIOFREQUENCY ABLATION T12 IMAGE GUIDED BONE BIOPSY VERTEBRAL AUGMENTATION WITH KYPHOPLASTY  COMPARISON:  Evan 03/19/2019 MEDICATIONS: 2 G ANCEF; The antibiotic was administered in an appropriate time interval prior to needle puncture of the skin. ANESTHESIA/SEDATION: 3.5 mg Versed, 175 mcg fentanyl, 30 mg IV Toradol Moderate Sedation Time:  55 minutes The patient was continuously monitored during the procedure by the interventional radiology nurse under my direct supervision. FLUOROSCOPY TIME:  Fluoroscopy Time: 6 minutes 24 seconds (434 mGy). COMPLICATIONS: None TECHNIQUE: Following a full explanation of the procedure along with the potentially associated complications, a witnessed informed consent was obtained. Specific risks that were discussed included bleeding, infection, injury to adjacent structures, neurologic injury, embolization of cement within the veins, failure of the procedure to improve pain, need for further procedure/ surgery, cardiopulmonary collapse, death. The patient understands the risks and wishes to proceed. The patient was placed prone on the fluoroscopic table. Nasal oxygen was administered. Physiologic monitoring was performed throughout the duration of the procedure. The skin overlying the T12 region was prepped and draped in the usual sterile fashion. The T12 vertebral body was identified and the right pedicle was infiltrated with 1% lidocaine. This was then followed by the advancement of an Medtronic Osteocool Trocar needle through the right pedicle into the posterior third of the vertebral body. Bone biopsy was then performed placed into formalin. The bone auger was then advanced. The left pedicle was then infiltrated with 1% lidocaine. A small incision was made with 11 blade scalpel, and then a Medtronic Osteocool Trocar needle was advanced through the left pedicle into the posterior 1/3 of the vertebral body. Bone auger was advanced. We elected to use 20 mm RF probes, which were placed through the bilateral cannula. Once position was confirmed, ablation was initiated for  total of approximately 12 minutes. The RFA ablation devices were then removed. Balloon devices were advanced bilaterally. Simultaneous inflation of the left and right pedicular cannula balloons was performed under fluoroscopic observation. Methylmethacrylate mixture was then reconstituted. Under biplane intermittent fluoroscopy, the methylmethacrylate was then injected into the cavity of T12 vertebral body with filling of the space. No extravasation was noted posteriorly into the spinal canal. No epidural venous contamination was seen. The needles were then removed. Hemostasis was achieved at the skin entry site. There were no acute complications. Patient tolerated the procedure well. The patient was observed for 30 minutes and returned to room in good condition. FINDINGS: Bilateral transpedicular approach to the T12 level for biopsy, RF ablation, and treatment  with vertebral augmentation. IMPRESSION: Status post image guided biopsy and treatment of T12 pathologic fracture with RF ablation and vertebral augmentation with kyphoplasty technique via bilateral trans pedicular approach. Signed, Dulcy Fanny. Dellia Nims, RPVI [facility] Electronically Signed   By: Corrie Mckusick D.O.   On: 03/28/2019 10:37   IR Radiologist Eval & Mgmt  Result Date: 03/21/2019 Please refer to notes tab for details about interventional procedure. (Op Note)   Labs:  CBC: Recent Labs    02/15/19 1043 03/28/19 0732 04/03/19 1038 04/11/19 1008  WBC 6.5 6.4 6.8 6.2  HGB 14.1 14.3 12.8* 12.0*  HCT 42.6 43.8 39.1 37.6*  PLT 204 202 217 268    COAGS: Recent Labs    03/28/19 0732  INR 1.0    BMP: Recent Labs    02/15/19 1043 03/28/19 0732 04/03/19 1038 04/11/19 1008  NA 133* 141 137 139  K 4.6 4.4 4.6 4.7  CL 104 105 103 105  CO2 24 26 24 26   GLUCOSE 93 115* 123* 99  BUN 10 10 15 13   CALCIUM 8.7* 9.1 8.9 9.1  CREATININE 0.95 0.95 0.93 0.88  GFRNONAA >60  >60 >60 >60  GFRAA >60 >60 >60 >60    LIVER FUNCTION TESTS: Recent Labs    02/15/19 1043 04/11/19 1008  BILITOT 0.9 0.8  AST 26 27  ALT 22 28  ALKPHOS 39 42  PROT 7.8 7.3  ALBUMIN 4.1 3.7    TUMOR MARKERS: No results for input(s): AFPTM, CEA, CA199, CHROMGRNA in the last 8760 hours.  Assessment and Plan:  Evan Gonzalez is a 79 yo male presenting as post-op visit today, SP T12 RFA and kyphoplasty of biopsy proven prostate met/pathologic fracture.    He tells me he is "about 90% better", and he seems very satisfied with the result.   I did let him know that we would see him back should he have any untoward effects or any other problem, but would not schedule him at this time for additional follow up.        Electronically Signed: Corrie Mckusick 04/11/2019, 4:02 PM   I spent a total of    15 Minutes in remote  clinical consultation, greater than 50% of which was counseling/coordinating care for RFA and KP of T12 pathologic fracture.    Visit type: Audio only (telephone). Audio (no video) only due to patient's lack of internet/smartphone capability. Alternative for in-person consultation at [facility], [place] [address], [place], [place]. This visit type was conducted due to national recommendations for restrictions regarding the COVID-19 Pandemic (e.g. social distancing).  This format is felt to be most appropriate for this patient at this time.  All issues noted in this document were discussed and addressed.

## 2019-04-13 ENCOUNTER — Ambulatory Visit: Payer: Medicare Other | Admitting: Urology

## 2019-04-13 ENCOUNTER — Other Ambulatory Visit: Payer: Self-pay

## 2019-04-19 ENCOUNTER — Other Ambulatory Visit: Payer: Self-pay | Admitting: *Deleted

## 2019-04-19 DIAGNOSIS — R591 Generalized enlarged lymph nodes: Secondary | ICD-10-CM

## 2019-04-19 MED ORDER — HYDROCODONE-ACETAMINOPHEN 5-325 MG PO TABS: 1.0000 | ORAL_TABLET | Freq: Four times a day (QID) | ORAL | 0 refills | Status: DC | PRN

## 2019-07-11 ENCOUNTER — Other Ambulatory Visit: Payer: Self-pay

## 2019-07-11 ENCOUNTER — Inpatient Hospital Stay: Payer: Medicare Other | Attending: Oncology

## 2019-07-11 DIAGNOSIS — C61 Malignant neoplasm of prostate: Secondary | ICD-10-CM | POA: Insufficient documentation

## 2019-07-11 LAB — CBC WITH DIFFERENTIAL/PLATELET
Abs Immature Granulocytes: 0.01 10*3/uL (ref 0.00–0.07)
Basophils Absolute: 0 10*3/uL (ref 0.0–0.1)
Basophils Relative: 1 %
Eosinophils Absolute: 0.2 10*3/uL (ref 0.0–0.5)
Eosinophils Relative: 2 %
HCT: 41.4 % (ref 39.0–52.0)
Hemoglobin: 13.5 g/dL (ref 13.0–17.0)
Immature Granulocytes: 0 %
Lymphocytes Relative: 40 %
Lymphs Abs: 3.3 10*3/uL (ref 0.7–4.0)
MCH: 30.7 pg (ref 26.0–34.0)
MCHC: 32.6 g/dL (ref 30.0–36.0)
MCV: 94.1 fL (ref 80.0–100.0)
Monocytes Absolute: 0.8 10*3/uL (ref 0.1–1.0)
Monocytes Relative: 10 %
Neutro Abs: 3.9 10*3/uL (ref 1.7–7.7)
Neutrophils Relative %: 47 %
Platelets: 202 10*3/uL (ref 150–400)
RBC: 4.4 MIL/uL (ref 4.22–5.81)
RDW: 13.4 % (ref 11.5–15.5)
WBC: 8.2 10*3/uL (ref 4.0–10.5)
nRBC: 0 % (ref 0.0–0.2)

## 2019-07-11 LAB — COMPREHENSIVE METABOLIC PANEL
ALT: 29 U/L (ref 0–44)
AST: 27 U/L (ref 15–41)
Albumin: 3.7 g/dL (ref 3.5–5.0)
Alkaline Phosphatase: 40 U/L (ref 38–126)
Anion gap: 7 (ref 5–15)
BUN: 17 mg/dL (ref 8–23)
CO2: 28 mmol/L (ref 22–32)
Calcium: 9.3 mg/dL (ref 8.9–10.3)
Chloride: 107 mmol/L (ref 98–111)
Creatinine, Ser: 1.08 mg/dL (ref 0.61–1.24)
GFR calc Af Amer: >60 mL/min (ref >60)
GFR calc non Af Amer: >60 mL/min (ref >60)
Glucose, Bld: 116 mg/dL — ABNORMAL HIGH (ref 70–99)
Potassium: 4.4 mmol/L (ref 3.5–5.1)
Sodium: 142 mmol/L (ref 135–145)
Total Bilirubin: 0.6 mg/dL (ref 0.3–1.2)
Total Protein: 7.5 g/dL (ref 6.5–8.1)

## 2019-07-11 LAB — PSA: Prostatic Specific Antigen: 24.1 ng/mL — ABNORMAL HIGH (ref 0.00–4.00)

## 2019-08-01 ENCOUNTER — Ambulatory Visit (INDEPENDENT_AMBULATORY_CARE_PROVIDER_SITE_OTHER): Payer: Medicare Other | Admitting: Podiatry

## 2019-08-01 ENCOUNTER — Other Ambulatory Visit: Payer: Self-pay

## 2019-08-01 ENCOUNTER — Encounter: Payer: Self-pay | Admitting: Podiatry

## 2019-08-01 DIAGNOSIS — B351 Tinea unguium: Secondary | ICD-10-CM

## 2019-08-01 DIAGNOSIS — M79675 Pain in left toe(s): Secondary | ICD-10-CM | POA: Diagnosis not present

## 2019-08-01 DIAGNOSIS — Z794 Long term (current) use of insulin: Secondary | ICD-10-CM | POA: Diagnosis not present

## 2019-08-01 DIAGNOSIS — E1142 Type 2 diabetes mellitus with diabetic polyneuropathy: Secondary | ICD-10-CM

## 2019-08-01 DIAGNOSIS — M79674 Pain in right toe(s): Secondary | ICD-10-CM

## 2019-08-01 NOTE — Progress Notes (Signed)
  Subjective:  Patient ID: Evan Gonzalez, male    DOB: 1940-04-03,  MRN: [id number]  Chief Complaint  Patient presents with  . Diabetes    pt is here for diabetic foot care, pt is also looking to talk about possible edema in both of his ankles.   79 y.o. male returns for the above complaint.  Patient presents with thickened elongated dystrophic toenails x10.  Painful to touch.  Patient has not been able to debride down himself.  Patient needs help debriding down the nails.  He denies any other acute complaints.  He is a diabetic with unknown A1c.  He denies any other acute complaints  Objective:  There were no vitals filed for this visit. Podiatric Exam: Vascular: dorsalis pedis and posterior tibial pulses are palpable bilateral. Capillary return is immediate. Temperature gradient is WNL. Skin turgor WNL  Sensorium: Normal Semmes Weinstein monofilament test. Normal tactile sensation bilaterally. Nail Exam: Pt has thick disfigured discolored nails with subungual debris noted bilateral entire nail hallux through fifth toenails.  Pain on palpation to the nails. Ulcer Exam: There is no evidence of ulcer or pre-ulcerative changes or infection. Orthopedic Exam: Muscle tone and strength are WNL. No limitations in general ROM. No crepitus or effusions noted. HAV  B/L.  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 Skin: No Porokeratosis. No infection or ulcers    Assessment & Plan:   1. Pain due to onychomycosis of toenails of both feet   2. Type 2 diabetes mellitus with diabetic polyneuropathy, with long-term current use of insulin ([place])     Patient was evaluated and treated and all questions answered.  Onychomycosis with pain  -Nails palliatively debrided as below. -Educated on self-care  Procedure: Nail Debridement Rationale: pain  Type of Debridement: manual, sharp debridement. Instrumentation: Nail nipper, rotary burr. Number of Nails: 10  Procedures and Treatment: Consent by patient was obtained  for treatment procedures. The patient understood the discussion of treatment and procedures well. All questions were answered thoroughly reviewed. Debridement of mycotic and hypertrophic toenails, 1 through 5 bilateral and clearing of subungual debris. No ulceration, no infection noted.  Return Visit-Office Procedure: Patient instructed to return to the office for a follow up visit 3 months for continued evaluation and treatment.  Boneta Lucks, DPM    No follow-ups on file.

## 2019-10-05 NOTE — Progress Notes (Signed)
Evan Gonzalez  [phone] [phone]  ID: Evan Gonzalez OB: 01/30/1941  MR#: [id number]  YHC#:[id number]  Patient Care Team: Evan Burrow, MD as PCP - General (Family Medicine)  I connected with Evan Gonzalez on 10/10/19 at 11:00 AM EDT by video enabled telemedicine visit and verified that I am speaking with the correct person using two identifiers.   I discussed the limitations, risks, security and privacy concerns of performing an evaluation and management service by telemedicine and the availability of in-person appointments. I also discussed with the patient that there may be a patient responsible charge related to this service. The patient expressed understanding and agreed to proceed.   Other persons participating in the visit and their role in the encounter: Patient, MD.  Patient's location: Clinic. Provider's location: Home.  CHIEF COMPLAINT: Stage IV prostate cancer.  INTERVAL HISTORY: Patient agreed to video assisted telemedicine visit for further evaluation and continuation of Eligard.  He currently feels well and is asymptomatic.  He admits to occasional diarrhea, but has not taken any treatment.  He denies any pain. He has no neurologic complaints.  He denies any recent fevers or illnesses.  He has a good appetite and denies weight loss.  He has no chest pain, shortness of breath, cough, or hemoptysis.  He denies any nausea, vomiting, or constipation.  He has no urinary complaints.  Patient offers no further specific complaints today.  REVIEW OF SYSTEMS:   Review of Systems  Constitutional: Negative.  Negative for fever and malaise/fatigue.  Respiratory: Negative.  Negative for cough and shortness of breath.   Cardiovascular: Negative.  Negative for chest pain and leg swelling.  Gastrointestinal: Positive for diarrhea. Negative for abdominal pain.  Genitourinary: Negative.  Negative for flank pain.  Musculoskeletal:  Negative.  Negative for back pain.  Skin: Negative.  Negative for rash.  Neurological: Negative.  Negative for dizziness, focal weakness, weakness and headaches.  Psychiatric/Behavioral: Negative.  The patient is not nervous/anxious.     As per HPI. Otherwise, a complete review of systems is negative.  PAST MEDICAL HISTORY: Past Medical History:  Diagnosis Date  . Diabetes mellitus without complication ([place])   . Glaucoma   . Hyperlipidemia   . Hypertension     PAST SURGICAL HISTORY: Past Surgical History:  Procedure Laterality Date  . EYE SURGERY    . IR BONE TUMOR(S)RF ABLATION  03/28/2019  . IR KYPHO THORACIC WITH BONE BIOPSY  03/28/2019  . IR RADIOLOGIST EVAL & MGMT  03/21/2019  . IR RADIOLOGIST EVAL & MGMT  04/11/2019    FAMILY HISTORY: Family History  Problem Relation Age of Onset  . Heart attack Mother   . Diabetes Father     ADVANCED DIRECTIVES (Y/N):  N  HEALTH MAINTENANCE: Social History   Tobacco Use  . Smoking status: Former Smoker    Quit date: 02/08/1987    Years since quitting: 32.6  . Smokeless tobacco: Never Used  Vaping Use  . Vaping Use: Never used  Substance Use Topics  . Alcohol use: No  . Drug use: No     Colonoscopy:  PAP:  Bone density:  Lipid panel:  No Known Allergies  Current Outpatient Medications  Medication Sig Dispense Refill  . acetaminophen (TYLENOL) 650 MG CR tablet Take 650 mg by mouth 2 (two) times daily.    . benzonatate (TESSALON) 200 MG capsule Take 1 capsule (200 mg total) by mouth 3 (three) times daily as needed for cough. [place]  capsule 0  . Brinzolamide-Brimonidine (SIMBRINZA) 1-0.2 % SUSP Simbrinza 1 %-0.2 % eye drops,suspension    . dipyridamole-aspirin (AGGRENOX) 200-25 MG 12hr capsule Take 1 capsule by mouth 2 (two) times daily.    . dorzolamide (TRUSOPT) 2 % ophthalmic solution Place 1 drop into both eyes 2 (two) times daily.    . fluticasone (FLONASE) 50 MCG/ACT nasal spray Place 2 sprays into both nostrils daily.  16 g 0  . glipiZIDE (GLUCOTROL) 5 MG tablet Take 5 mg by mouth daily before breakfast.    . HYDROcodone-acetaminophen (NORCO) 5-325 MG tablet Take 1 tablet by mouth every 6 (six) hours as needed for moderate pain. Take 2 at night as needed. 90 tablet 0  . latanoprost (XALATAN) 0.005 % ophthalmic solution Place 1 drop into both eyes at bedtime.    Marland Kitchen lisinopril (PRINIVIL,ZESTRIL) 5 MG tablet Take 5 mg by mouth daily as needed (hypertension).     . meclizine (ANTIVERT) 25 MG tablet Take 1 tablet (25 mg total) by mouth 3 (three) times daily as needed for dizziness or nausea. 30 tablet 1  . metFORMIN (GLUCOPHAGE) 500 MG tablet Take 500 mg by mouth 2 (two) times daily with a meal.    . ondansetron (ZOFRAN ODT) 4 MG disintegrating tablet Take 1 tablet (4 mg total) by mouth every 8 (eight) hours as needed for nausea or vomiting. 20 tablet 0  . simvastatin (ZOCOR) 20 MG tablet Take 20 mg by mouth at bedtime.    . timolol (TIMOPTIC) 0.25 % ophthalmic solution      No current facility-administered medications for this visit.    OBJECTIVE: Vitals:   10/10/19 1022  Pulse: 64  Resp: 18  Temp: (!) 97.4 F (36.3 C)  SpO2: 99%     Body mass index is 38.3 kg/m.    ECOG FS:0 - Asymptomatic  General: Well-developed, well-nourished, no acute distress. HEENT: Normocephalic. Neuro: Alert, answering all questions appropriately. Cranial nerves grossly intact. Psych: Normal affect.   LAB RESULTS:  Lab Results  Component Value Date   NA 139 10/10/2019   K 4.3 10/10/2019   CL 105 10/10/2019   CO2 28 10/10/2019   GLUCOSE 123 (H) 10/10/2019   BUN 9 10/10/2019   CREATININE 0.91 10/10/2019   CALCIUM 8.5 (L) 10/10/2019   PROT 7.0 10/10/2019   ALBUMIN 3.6 10/10/2019   AST 33 10/10/2019   ALT 26 10/10/2019   ALKPHOS 35 (L) 10/10/2019   BILITOT 0.4 10/10/2019   GFRNONAA >60 10/10/2019   GFRAA >60 10/10/2019    Lab Results  Component Value Date   WBC 5.6 10/10/2019   NEUTROABS 2.5 10/10/2019    HGB 12.6 (L) 10/10/2019   HCT 37.5 (L) 10/10/2019   MCV 91.7 10/10/2019   PLT 194 10/10/2019     STUDIES: No results found.  ASSESSMENT: Stage IV prostate cancer.  PLAN:    1.  Stage IV prostate cancer: Biopsy and subsequent RFA of the T12 bony lesion confirmed metastatic prostate cancer.  Patient's pain has now resolved.  His PSA has trended down from 213-24.1, today's result is pending.  Continue 45 mg Eligard every 6 months.  No further interventions are needed at this time.  Proceed with treatment today.  Return to clinic in 3 months for laboratory work only and then in 6 months for laboratory work, further evaluation, and continuation of treatment.   2.  Back pain: Resolved.  RFA as above.  3.  Anemia: Mild, monitor.  I provided 30 minutes of  face-to-face video visit time during this encounter which included chart review, counseling, and coordination of care as documented above.    Patient expressed understanding and was in agreement with this plan. He also understands that He can call clinic at any time with any questions, concerns, or complaints.   Cancer Staging No matching staging information was found for the patient.  Lloyd Huger, MD   10/10/2019 11:31 AM

## 2019-10-09 ENCOUNTER — Encounter: Payer: Self-pay | Admitting: Oncology

## 2019-10-09 NOTE — Progress Notes (Signed)
Patient called for pre assessment. He denies any pain at this time. But he states he has been dealing with some frequent diarrhea for the last month and would like to know if there is anything the provider can suggest to help.

## 2019-10-10 ENCOUNTER — Other Ambulatory Visit: Payer: Self-pay

## 2019-10-10 ENCOUNTER — Inpatient Hospital Stay: Payer: Medicare Other | Attending: Oncology

## 2019-10-10 ENCOUNTER — Inpatient Hospital Stay (HOSPITAL_BASED_OUTPATIENT_CLINIC_OR_DEPARTMENT_OTHER): Payer: Medicare Other | Admitting: Oncology

## 2019-10-10 ENCOUNTER — Inpatient Hospital Stay: Payer: Medicare Other

## 2019-10-10 VITALS — HR 64 | Temp 97.4°F | Resp 18 | Wt 274.6 lb

## 2019-10-10 DIAGNOSIS — Z79899 Other long term (current) drug therapy: Secondary | ICD-10-CM | POA: Insufficient documentation

## 2019-10-10 DIAGNOSIS — Z87891 Personal history of nicotine dependence: Secondary | ICD-10-CM | POA: Diagnosis not present

## 2019-10-10 DIAGNOSIS — D649 Anemia, unspecified: Secondary | ICD-10-CM | POA: Insufficient documentation

## 2019-10-10 DIAGNOSIS — E119 Type 2 diabetes mellitus without complications: Secondary | ICD-10-CM | POA: Diagnosis not present

## 2019-10-10 DIAGNOSIS — Z8249 Family history of ischemic heart disease and other diseases of the circulatory system: Secondary | ICD-10-CM | POA: Diagnosis not present

## 2019-10-10 DIAGNOSIS — C61 Malignant neoplasm of prostate: Secondary | ICD-10-CM

## 2019-10-10 DIAGNOSIS — Z833 Family history of diabetes mellitus: Secondary | ICD-10-CM | POA: Diagnosis not present

## 2019-10-10 DIAGNOSIS — R197 Diarrhea, unspecified: Secondary | ICD-10-CM | POA: Diagnosis not present

## 2019-10-10 DIAGNOSIS — Z5111 Encounter for antineoplastic chemotherapy: Secondary | ICD-10-CM | POA: Diagnosis present

## 2019-10-10 LAB — COMPREHENSIVE METABOLIC PANEL
ALT: 26 U/L (ref 0–44)
AST: 33 U/L (ref 15–41)
Albumin: 3.6 g/dL (ref 3.5–5.0)
Alkaline Phosphatase: 35 U/L — ABNORMAL LOW (ref 38–126)
Anion gap: 6 (ref 5–15)
BUN: 9 mg/dL (ref 8–23)
CO2: 28 mmol/L (ref 22–32)
Calcium: 8.5 mg/dL — ABNORMAL LOW (ref 8.9–10.3)
Chloride: 105 mmol/L (ref 98–111)
Creatinine, Ser: 0.91 mg/dL (ref 0.61–1.24)
GFR calc Af Amer: >60 mL/min (ref >60)
GFR calc non Af Amer: >60 mL/min (ref >60)
Glucose, Bld: 123 mg/dL — ABNORMAL HIGH (ref 70–99)
Potassium: 4.3 mmol/L (ref 3.5–5.1)
Sodium: 139 mmol/L (ref 135–145)
Total Bilirubin: 0.4 mg/dL (ref 0.3–1.2)
Total Protein: 7 g/dL (ref 6.5–8.1)

## 2019-10-10 LAB — CBC WITH DIFFERENTIAL/PLATELET
Abs Immature Granulocytes: 0.01 10*3/uL (ref 0.00–0.07)
Basophils Absolute: 0 10*3/uL (ref 0.0–0.1)
Basophils Relative: 0 %
Eosinophils Absolute: 0.1 10*3/uL (ref 0.0–0.5)
Eosinophils Relative: 2 %
HCT: 37.5 % — ABNORMAL LOW (ref 39.0–52.0)
Hemoglobin: 12.6 g/dL — ABNORMAL LOW (ref 13.0–17.0)
Immature Granulocytes: 0 %
Lymphocytes Relative: 43 %
Lymphs Abs: 2.4 10*3/uL (ref 0.7–4.0)
MCH: 30.8 pg (ref 26.0–34.0)
MCHC: 33.6 g/dL (ref 30.0–36.0)
MCV: 91.7 fL (ref 80.0–100.0)
Monocytes Absolute: 0.6 10*3/uL (ref 0.1–1.0)
Monocytes Relative: 10 %
Neutro Abs: 2.5 10*3/uL (ref 1.7–7.7)
Neutrophils Relative %: 45 %
Platelets: 194 10*3/uL (ref 150–400)
RBC: 4.09 MIL/uL — ABNORMAL LOW (ref 4.22–5.81)
RDW: 13.2 % (ref 11.5–15.5)
WBC: 5.6 10*3/uL (ref 4.0–10.5)
nRBC: 0 % (ref 0.0–0.2)

## 2019-10-10 LAB — PSA: Prostatic Specific Antigen: 9.69 ng/mL — ABNORMAL HIGH (ref 0.00–4.00)

## 2019-10-10 MED ORDER — LEUPROLIDE ACETATE (6 MONTH) 45 MG ~~LOC~~ KIT
45.0000 mg | PACK | Freq: Once | SUBCUTANEOUS | Status: AC
  Administered 2019-10-10: 45 mg via SUBCUTANEOUS
  Filled 2019-10-10: qty 45

## 2019-11-02 ENCOUNTER — Other Ambulatory Visit: Payer: Self-pay

## 2019-11-02 ENCOUNTER — Ambulatory Visit (INDEPENDENT_AMBULATORY_CARE_PROVIDER_SITE_OTHER): Payer: Medicare Other | Admitting: Podiatry

## 2019-11-02 ENCOUNTER — Encounter: Payer: Self-pay | Admitting: Podiatry

## 2019-11-02 DIAGNOSIS — Z794 Long term (current) use of insulin: Secondary | ICD-10-CM

## 2019-11-02 DIAGNOSIS — B351 Tinea unguium: Secondary | ICD-10-CM

## 2019-11-02 DIAGNOSIS — E1142 Type 2 diabetes mellitus with diabetic polyneuropathy: Secondary | ICD-10-CM

## 2019-11-02 DIAGNOSIS — M79675 Pain in left toe(s): Secondary | ICD-10-CM | POA: Diagnosis not present

## 2019-11-02 DIAGNOSIS — M7989 Other specified soft tissue disorders: Secondary | ICD-10-CM | POA: Diagnosis not present

## 2019-11-02 DIAGNOSIS — M79674 Pain in right toe(s): Secondary | ICD-10-CM

## 2019-11-02 MED ORDER — NONFORMULARY OR COMPOUNDED ITEM: 2 refills | Status: AC

## 2019-11-02 NOTE — Addendum Note (Signed)
Addended by: Graceann Congress D on: 11/02/2019 11:40 AM   Modules accepted: Orders

## 2019-11-02 NOTE — Progress Notes (Signed)
This patient returns to my office for at risk foot care.  This patient requires this care by a professional since this patient will be at risk due to having diabetes.   This patient is unable to cut nails himself since the patient cannot reach his nails.These nails are painful walking and wearing shoes.  This patient presents for at risk foot care today.  General Appearance  Alert, conversant and in no acute stress.  Vascular  Dorsalis pedis and posterior tibial  pulses are palpable  bilaterally.  Capillary return is within normal limits  bilaterally. Temperature is within normal limits  bilaterally.  Neurologic  Senn-Weinstein monofilament wire test within normal limits  bilaterally. Muscle power within normal limits bilaterally.  Nails Thick disfigured discolored nails with subungual debris  from hallux to fifth toes bilaterally. No evidence of bacterial infection or drainage bilaterally.  Orthopedic  No limitations of motion  feet .  No crepitus or effusions noted.  HAV  B/L.  Hammer toes  B/L.  Skin  normotropic skin with no porokeratosis noted bilaterally.  No signs of infections or ulcers noted.     Onychomycosis  Pain in right toes  Pain in left eg/foot swelling   Consent was obtained for treatment procedures.   Mechanical debridement of nails 1-5  bilaterally performed with a nail nipper.  Filed with dremel without incident. Pain medicine to be called in for this patient.   Return office visit  3 months                   Told patient to return for periodic foot care and evaluation due to potential at risk complications.  Gardiner Barefoot DPM   Gardiner Barefoot DPM

## 2019-12-29 ENCOUNTER — Other Ambulatory Visit: Payer: Self-pay | Admitting: *Deleted

## 2019-12-29 DIAGNOSIS — C61 Malignant neoplasm of prostate: Secondary | ICD-10-CM

## 2020-01-09 ENCOUNTER — Inpatient Hospital Stay: Payer: Medicare Other | Attending: Oncology

## 2020-01-09 DIAGNOSIS — Z87891 Personal history of nicotine dependence: Secondary | ICD-10-CM | POA: Diagnosis not present

## 2020-01-09 DIAGNOSIS — E119 Type 2 diabetes mellitus without complications: Secondary | ICD-10-CM | POA: Insufficient documentation

## 2020-01-09 DIAGNOSIS — E785 Hyperlipidemia, unspecified: Secondary | ICD-10-CM | POA: Insufficient documentation

## 2020-01-09 DIAGNOSIS — C61 Malignant neoplasm of prostate: Secondary | ICD-10-CM | POA: Insufficient documentation

## 2020-01-09 DIAGNOSIS — I10 Essential (primary) hypertension: Secondary | ICD-10-CM | POA: Diagnosis not present

## 2020-01-09 LAB — COMPREHENSIVE METABOLIC PANEL
ALT: 27 U/L (ref 0–44)
AST: 34 U/L (ref 15–41)
Albumin: 3.7 g/dL (ref 3.5–5.0)
Alkaline Phosphatase: 34 U/L — ABNORMAL LOW (ref 38–126)
Anion gap: 10 (ref 5–15)
BUN: 9 mg/dL (ref 8–23)
CO2: 27 mmol/L (ref 22–32)
Calcium: 8.7 mg/dL — ABNORMAL LOW (ref 8.9–10.3)
Chloride: 101 mmol/L (ref 98–111)
Creatinine, Ser: 0.75 mg/dL (ref 0.61–1.24)
GFR, Estimated: >60 mL/min (ref >60)
Glucose, Bld: 118 mg/dL — ABNORMAL HIGH (ref 70–99)
Potassium: 4.2 mmol/L (ref 3.5–5.1)
Sodium: 138 mmol/L (ref 135–145)
Total Bilirubin: 0.5 mg/dL (ref 0.3–1.2)
Total Protein: 7.4 g/dL (ref 6.5–8.1)

## 2020-01-09 LAB — CBC WITH DIFFERENTIAL/PLATELET
Abs Immature Granulocytes: 0.01 10*3/uL (ref 0.00–0.07)
Basophils Absolute: 0 10*3/uL (ref 0.0–0.1)
Basophils Relative: 0 %
Eosinophils Absolute: 0.2 10*3/uL (ref 0.0–0.5)
Eosinophils Relative: 2 %
HCT: 40.5 % (ref 39.0–52.0)
Hemoglobin: 13.1 g/dL (ref 13.0–17.0)
Immature Granulocytes: 0 %
Lymphocytes Relative: 44 %
Lymphs Abs: 3.1 10*3/uL (ref 0.7–4.0)
MCH: 29.8 pg (ref 26.0–34.0)
MCHC: 32.3 g/dL (ref 30.0–36.0)
MCV: 92.3 fL (ref 80.0–100.0)
Monocytes Absolute: 0.6 10*3/uL (ref 0.1–1.0)
Monocytes Relative: 8 %
Neutro Abs: 3.1 10*3/uL (ref 1.7–7.7)
Neutrophils Relative %: 46 %
Platelets: 199 10*3/uL (ref 150–400)
RBC: 4.39 MIL/uL (ref 4.22–5.81)
RDW: 13.3 % (ref 11.5–15.5)
WBC: 7 10*3/uL (ref 4.0–10.5)
nRBC: 0 % (ref 0.0–0.2)

## 2020-01-09 LAB — PSA: Prostatic Specific Antigen: 7.45 ng/mL — ABNORMAL HIGH (ref 0.00–4.00)

## 2020-01-15 ENCOUNTER — Inpatient Hospital Stay: Payer: Medicare Other

## 2020-01-15 ENCOUNTER — Inpatient Hospital Stay (HOSPITAL_BASED_OUTPATIENT_CLINIC_OR_DEPARTMENT_OTHER): Payer: Medicare Other | Admitting: Licensed Clinical Social Worker

## 2020-01-15 ENCOUNTER — Encounter: Payer: Self-pay | Admitting: Licensed Clinical Social Worker

## 2020-01-15 DIAGNOSIS — C61 Malignant neoplasm of prostate: Secondary | ICD-10-CM

## 2020-01-15 NOTE — Progress Notes (Signed)
REFERRING PROVIDER: Lloyd Huger, MD [address] [place] [place],  [place] [address]  PRIMARY PROVIDER:  Theotis Burrow, MD  PRIMARY REASON FOR VISIT:  1. Prostate cancer [facility])      HISTORY OF PRESENT ILLNESS:   Evan Gonzalez, a 79 y.o. male, was seen for a [facility] cancer genetics consultation at the request of Dr. Grayland Ormond due to a personal history of metastatic prostate cancer.  Evan Gonzalez presents to clinic today to discuss the possibility of a hereditary predisposition to cancer, genetic testing, and to further clarify his future cancer risks, as well as potential cancer risks for family members.   In 2021, at the age of 25, Evan Gonzalez was diagnosed with stage IV prostate cancer. Thus far this has been treated with Eligard. Patient reports he has not had a colonoscopy.   CANCER HISTORY:  Oncology History   No history exists.     Past Medical History:  Diagnosis Date  . Diabetes mellitus without complication ([place])   . Glaucoma   . Hyperlipidemia   . Hypertension     Past Surgical History:  Procedure Laterality Date  . EYE SURGERY    . IR BONE TUMOR(S)RF ABLATION  03/28/2019  . IR KYPHO THORACIC WITH BONE BIOPSY  03/28/2019  . IR RADIOLOGIST EVAL & MGMT  03/21/2019  . IR RADIOLOGIST EVAL & MGMT  04/11/2019    Social History   Socioeconomic History  . Marital status: Single    Spouse name: Not on file  . Number of children: Not on file  . Years of education: Not on file  . Highest education level: Not on file  Occupational History  . Not on file  Tobacco Use  . Smoking status: Former Smoker    Quit date: 02/08/1987    Years since quitting: 32.9  . Smokeless tobacco: Never Used  Vaping Use  . Vaping Use: Never used  Substance and Sexual Activity  . Alcohol use: No  . Drug use: No  . Sexual activity: Not on file  Other Topics Concern  . Not on file  Social History Narrative  . Not on file   Social Determinants of Health   Financial Resource  Strain:   . Difficulty of Paying Living Expenses: Not on file  Food Insecurity:   . Worried About Charity fundraiser in the Last Year: Not on file  . Ran Out of Food in the Last Year: Not on file  Transportation Needs:   . Lack of Transportation (Medical): Not on file  . Lack of Transportation (Non-Medical): Not on file  Physical Activity:   . Days of Exercise per Week: Not on file  . Minutes of Exercise per Session: Not on file  Stress:   . Feeling of Stress : Not on file  Social Connections:   . Frequency of Communication with Friends and Family: Not on file  . Frequency of Social Gatherings with Friends and Family: Not on file  . Attends Religious Services: Not on file  . Active Member of Clubs or Organizations: Not on file  . Attends Archivist Meetings: Not on file  . Marital Status: Not on file     FAMILY HISTORY:  We obtained a detailed, 4-generation family history.  Significant diagnoses are listed below: Family History  Problem Relation Age of Onset  . Heart attack Mother   . Diabetes Father      Evan Gonzalez had 3 sisters and 2 brothers, one sister passed  at age 53 but he is unsure the cause. He does not have children but does have nieces/nephews.  Evan Gonzalez mother died in her 29s due to heart issues. No reported family history of cancer on this side.  Evan Gonzalez father died in his 68s, no reported family history of cancer on this side either.   Evan Gonzalez is unaware of previous family history of genetic testing for hereditary cancer risks. Patient's maternal ancestors are of African American descent, and paternal ancestors are of African American descent. There is no reported Ashkenazi Jewish ancestry. There is no known consanguinity.  GENETIC COUNSELING ASSESSMENT: Evan Gonzalez is a 79 y.o. male with a personal history of metastatic prostate cancer which is somewhat suggestive of a hereditary cancer syndrome and predisposition to cancer. We,  therefore, discussed and recommended the following at today's visit.   DISCUSSION: We discussed that approximately 5-10% of prostate cancer is hereditary  Most cases of hereditary prostate cancer are associated with BRCA1/BRCA2 genes, although there are other genes associated with hereditary prostate cancer as well. We discussed that testing is beneficial for several reasons including knowing if targeted treatment would be beneficial, knowing about other cancer risks, identifying potential screening and risk-reduction options that may be appropriate, and to understand if other family members could be at risk for cancer and allow them to undergo genetic testing.   We reviewed the characteristics, features and inheritance patterns of hereditary cancer syndromes. We also discussed genetic testing, including the appropriate family members to test, the process of testing, insurance coverage and turn-around-time for results. We discussed the implications of a negative, positive and/or variant of uncertain significant result. We recommended Evan Gonzalez pursue genetic testing for the [facility] Common Hereditary Cancers gene panel.   The Common Hereditary Cancers Panel offered by [facility] includes sequencing and/or deletion duplication testing of the following 48 genes: APC, ATM, AXIN2, BARD1, BMPR1A, BRCA1, BRCA2, BRIP1, CDH1, CDKN2A (p14ARF), CDKN2A (p16INK4a), CKD4, CHEK2, CTNNA1, DICER1, EPCAM (Deletion/duplication testing only), GREM1 (promoter region deletion/duplication testing only), KIT, MEN1, MLH1, MSH2, MSH3, MSH6, MUTYH, NBN, NF1, NHTL1, PALB2, PDGFRA, PMS2, POLD1, POLE, PTEN, RAD50, RAD51C, RAD51D, RNF43, SDHB, SDHC, SDHD, SMAD4, SMARCA4. STK11, TP53, TSC1, TSC2, and VHL.  The following genes were evaluated for sequence changes only: SDHA and HOXB13 c.251G>A variant only.  Based on Evan Gonzalez family history of cancer, he meets medical criteria for genetic testing. Despite that he meets criteria, he may  still have an out of pocket cost.   PLAN: After considering the risks, benefits, and limitations, Evan Gonzalez provided informed consent to pursue genetic testing. He would like to wait until his next lab appointment to avoid multiple blood draws, this is on 04/10/20. His sample will be sent to [facility] for analysis of the Common Hereditary Cancers Panel. Results should be available within approximately 2-3 weeks' time from 2/23, at which point they will be disclosed by telephone to Evan Gonzalez, as will any additional recommendations warranted by these results. Evan Gonzalez will receive a summary of his genetic counseling visit and a copy of his results once available. This information will also be available in Epic.   Evan Gonzalez questions were answered to his satisfaction today. Our contact information was provided should additional questions or concerns arise. Thank you for the referral and allowing [place] to share in the care of your patient.   Faith Rogue, MS, [facility] Genetic Counselor [email or web address] Phone: [phone]  The patient was seen for a total of 25 minutes in face-to-face  genetic counseling.  Dr. Grayland Ormond was available for discussion regarding this case.   _______________________________________________________________________ For Office Staff:  Number of people involved in session: 1 Was an Intern/ student involved with case: no

## 2020-02-01 ENCOUNTER — Ambulatory Visit: Payer: Medicare Other | Admitting: Podiatry

## 2020-03-11 DIAGNOSIS — C7951 Secondary malignant neoplasm of bone: Secondary | ICD-10-CM | POA: Insufficient documentation

## 2020-03-11 DIAGNOSIS — C61 Malignant neoplasm of prostate: Secondary | ICD-10-CM | POA: Insufficient documentation

## 2020-04-07 NOTE — Progress Notes (Signed)
[place]  [phone] [phone]  ID: [name] OB: [birth year]  MR#: [id number]  NUU#:[id number]  Patient Care Team: Theotis Burrow, MD as PCP - General (Family Medicine) Lloyd Huger, MD as Consulting Physician (Hematology and Oncology)  CHIEF COMPLAINT: Stage IVB prostate cancer.  INTERVAL HISTORY: Patient returns to clinic today for repeat laboratory work, further evaluation, and continuation of Eligard.  He continues to feel well and remains asymptomatic.  He denies any pain. He has no neurologic complaints.  He denies any recent fevers or illnesses.  He has a good appetite and denies weight loss.  He has no chest pain, shortness of breath, cough, or hemoptysis.  He denies any nausea, vomiting, constipation or diarrhea.  He has no urinary complaints.  Patient offers no specific complaints today.  REVIEW OF SYSTEMS:   Review of Systems  Constitutional: Negative.  Negative for fever and malaise/fatigue.  Respiratory: Negative.  Negative for cough and shortness of breath.   Cardiovascular: Negative.  Negative for chest pain and leg swelling.  Gastrointestinal: Negative.  Negative for abdominal pain and diarrhea.  Genitourinary: Negative.  Negative for flank pain.  Musculoskeletal: Negative.  Negative for back pain.  Skin: Negative.  Negative for rash.  Neurological: Negative.  Negative for dizziness, focal weakness, weakness and headaches.  Psychiatric/Behavioral: Negative.  The patient is not nervous/anxious.     As per HPI. Otherwise, a complete review of systems is negative.  PAST MEDICAL HISTORY: Past Medical History:  Diagnosis Date  . Diabetes mellitus without complication ([place])   . Glaucoma   . Hyperlipidemia   . Hypertension     PAST SURGICAL HISTORY: Past Surgical History:  Procedure Laterality Date  . EYE SURGERY    . IR BONE TUMOR(S)RF ABLATION  03/28/2019  . IR KYPHO THORACIC WITH BONE BIOPSY   03/28/2019  . IR RADIOLOGIST EVAL & MGMT  03/21/2019  . IR RADIOLOGIST EVAL & MGMT  04/11/2019    FAMILY HISTORY: Family History  Problem Relation Age of Onset  . Heart attack Mother   . Diabetes Father     ADVANCED DIRECTIVES (Y/N):  N  HEALTH MAINTENANCE: Social History   Tobacco Use  . Smoking status: Former Smoker    Quit date: 02/08/1987    Years since quitting: 33.1  . Smokeless tobacco: Never Used  Vaping Use  . Vaping Use: Never used  Substance Use Topics  . Alcohol use: No  . Drug use: No     Colonoscopy:  PAP:  Bone density:  Lipid panel:  No Known Allergies  Current Outpatient Medications  Medication Sig Dispense Refill  . acetaminophen (TYLENOL) 650 MG CR tablet Take 650 mg by mouth 2 (two) times daily.    Marland Kitchen amLODipine (NORVASC) 5 MG tablet     . Brinzolamide-Brimonidine (SIMBRINZA) 1-0.2 % SUSP Simbrinza 1 %-0.2 % eye drops,suspension    . dipyridamole-aspirin (AGGRENOX) 200-25 MG 12hr capsule Take 1 capsule by mouth 2 (two) times daily.    . dorzolamide (TRUSOPT) 2 % ophthalmic solution Place 1 drop into both eyes 2 (two) times daily.    . fluticasone (FLONASE) 50 MCG/ACT nasal spray Place 2 sprays into both nostrils daily. 16 g 0  . glipiZIDE (GLUCOTROL) 5 MG tablet Take 5 mg by mouth daily before breakfast.    . latanoprost (XALATAN) 0.005 % ophthalmic solution Place 1 drop into both eyes at bedtime.    Marland Kitchen lisinopril-hydrochlorothiazide (ZESTORETIC) 10-12.5 MG tablet     .  metFORMIN (GLUCOPHAGE) 500 MG tablet Take 500 mg by mouth 2 (two) times daily with a meal.    . simvastatin (ZOCOR) 20 MG tablet Take 20 mg by mouth at bedtime.    . timolol (TIMOPTIC) 0.25 % ophthalmic solution     . benzonatate (TESSALON) 200 MG capsule Take 1 capsule (200 mg total) by mouth 3 (three) times daily as needed for cough. (Patient not taking: Reported on 04/10/2020) 30 capsule 0  . HYDROcodone-acetaminophen (NORCO) 5-325 MG tablet Take 1 tablet by mouth every 6 (six) hours  as needed for moderate pain. Take 2 at night as needed. 30 tablet 0  . meclizine (ANTIVERT) 25 MG tablet Take 1 tablet (25 mg total) by mouth 3 (three) times daily as needed for dizziness or nausea. (Patient not taking: Reported on 04/10/2020) 30 tablet 1  . NONFORMULARY OR COMPOUNDED ITEM See pharmacy note (Patient not taking: Reported on 04/10/2020) 120 each 2  . ondansetron (ZOFRAN ODT) 4 MG disintegrating tablet Take 1 tablet (4 mg total) by mouth every 8 (eight) hours as needed for nausea or vomiting. (Patient not taking: Reported on 04/10/2020) 20 tablet 0   No current facility-administered medications for this visit.    OBJECTIVE: Vitals:   04/10/20 1125  BP: 129/77  Pulse: 62  Resp: 18  Temp: 98.5 F (36.9 C)     Body mass index is 38.35 kg/m.    ECOG FS:0 - Asymptomatic  General: Well-developed, well-nourished, no acute distress. Eyes: Pink conjunctiva, anicteric sclera. HEENT: Normocephalic, moist mucous membranes. Lungs: No audible wheezing or coughing. Heart: Regular rate and rhythm. Abdomen: Soft, nontender, no obvious distention. Musculoskeletal: No edema, cyanosis, or clubbing. Neuro: Alert, answering all questions appropriately. Cranial nerves grossly intact. Skin: No rashes or petechiae noted. Psych: Normal affect.   LAB RESULTS:  Lab Results  Component Value Date   NA 138 04/10/2020   K 4.4 04/10/2020   CL 102 04/10/2020   CO2 26 04/10/2020   GLUCOSE 117 (H) 04/10/2020   BUN 12 04/10/2020   CREATININE 0.88 04/10/2020   CALCIUM 9.0 04/10/2020   PROT 7.8 04/10/2020   ALBUMIN 3.8 04/10/2020   AST 36 04/10/2020   ALT 25 04/10/2020   ALKPHOS 36 (L) 04/10/2020   BILITOT 0.5 04/10/2020   GFRNONAA >60 04/10/2020   GFRAA >60 10/10/2019    Lab Results  Component Value Date   WBC 7.3 04/10/2020   NEUTROABS 3.3 04/10/2020   HGB 12.7 (L) 04/10/2020   HCT 39.0 04/10/2020   MCV 93.5 04/10/2020   PLT 200 04/10/2020     STUDIES: No results  found.  ASSESSMENT: Stage IVB prostate cancer.  PLAN:    1.  Stage IVB prostate cancer: Biopsy and subsequent RFA of the T12 bony lesion confirmed metastatic prostate cancer.  Patient's pain has now resolved.  Patient's PSA was initially 213, but now has trended down to 8.27.  Continue with 45 mg Eligard every 6 months.  Given his minimal bony disease, he does not require Zometa at this time.  Return to clinic in 6 months with repeat laboratory work and continuation of treatment.   2.  Back pain: Resolved.  RFA as above.  3.  Anemia: Chronic and unchanged.  I spent a total of 30 minutes reviewing chart data, face-to-face evaluation with the patient, counseling and coordination of care as detailed above.    Patient expressed understanding and was in agreement with this plan. He also understands that He can call clinic at any  time with any questions, concerns, or complaints.   Cancer Staging Prostate cancer [facility]) Staging form: Prostate, AJCC 8th Edition - Clinical stage from 04/11/2020: Stage IVB (cTX, cN0, cM1b, PSA: 213) - Signed by Lloyd Huger, MD on 04/11/2020 Prostate specific antigen (PSA) range: 20 or greater   Lloyd Huger, MD   04/11/2020 6:52 AM

## 2020-04-10 ENCOUNTER — Inpatient Hospital Stay: Payer: Medicare Other | Attending: Oncology

## 2020-04-10 ENCOUNTER — Other Ambulatory Visit: Payer: Self-pay | Admitting: *Deleted

## 2020-04-10 ENCOUNTER — Encounter: Payer: Self-pay | Admitting: Oncology

## 2020-04-10 ENCOUNTER — Inpatient Hospital Stay (HOSPITAL_BASED_OUTPATIENT_CLINIC_OR_DEPARTMENT_OTHER): Payer: Medicare Other | Admitting: Oncology

## 2020-04-10 ENCOUNTER — Inpatient Hospital Stay: Payer: Medicare Other

## 2020-04-10 ENCOUNTER — Other Ambulatory Visit: Payer: Self-pay

## 2020-04-10 VITALS — BP 129/77 | HR 62 | Temp 98.5°F | Resp 18 | Wt 275.0 lb

## 2020-04-10 DIAGNOSIS — E785 Hyperlipidemia, unspecified: Secondary | ICD-10-CM | POA: Diagnosis not present

## 2020-04-10 DIAGNOSIS — E119 Type 2 diabetes mellitus without complications: Secondary | ICD-10-CM | POA: Insufficient documentation

## 2020-04-10 DIAGNOSIS — Z7984 Long term (current) use of oral hypoglycemic drugs: Secondary | ICD-10-CM | POA: Insufficient documentation

## 2020-04-10 DIAGNOSIS — C61 Malignant neoplasm of prostate: Secondary | ICD-10-CM

## 2020-04-10 DIAGNOSIS — Z79899 Other long term (current) drug therapy: Secondary | ICD-10-CM | POA: Insufficient documentation

## 2020-04-10 DIAGNOSIS — I10 Essential (primary) hypertension: Secondary | ICD-10-CM | POA: Insufficient documentation

## 2020-04-10 DIAGNOSIS — Z79818 Long term (current) use of other agents affecting estrogen receptors and estrogen levels: Secondary | ICD-10-CM | POA: Diagnosis not present

## 2020-04-10 DIAGNOSIS — D649 Anemia, unspecified: Secondary | ICD-10-CM | POA: Diagnosis not present

## 2020-04-10 DIAGNOSIS — Z87891 Personal history of nicotine dependence: Secondary | ICD-10-CM | POA: Insufficient documentation

## 2020-04-10 DIAGNOSIS — R591 Generalized enlarged lymph nodes: Secondary | ICD-10-CM

## 2020-04-10 LAB — CBC WITH DIFFERENTIAL/PLATELET
Abs Immature Granulocytes: 0.01 10*3/uL (ref 0.00–0.07)
Basophils Absolute: 0 10*3/uL (ref 0.0–0.1)
Basophils Relative: 0 %
Eosinophils Absolute: 0.1 10*3/uL (ref 0.0–0.5)
Eosinophils Relative: 2 %
HCT: 39 % (ref 39.0–52.0)
Hemoglobin: 12.7 g/dL — ABNORMAL LOW (ref 13.0–17.0)
Immature Granulocytes: 0 %
Lymphocytes Relative: 44 %
Lymphs Abs: 3.2 10*3/uL (ref 0.7–4.0)
MCH: 30.5 pg (ref 26.0–34.0)
MCHC: 32.6 g/dL (ref 30.0–36.0)
MCV: 93.5 fL (ref 80.0–100.0)
Monocytes Absolute: 0.7 10*3/uL (ref 0.1–1.0)
Monocytes Relative: 10 %
Neutro Abs: 3.3 10*3/uL (ref 1.7–7.7)
Neutrophils Relative %: 44 %
Platelets: 200 10*3/uL (ref 150–400)
RBC: 4.17 MIL/uL — ABNORMAL LOW (ref 4.22–5.81)
RDW: 14 % (ref 11.5–15.5)
WBC: 7.3 10*3/uL (ref 4.0–10.5)
nRBC: 0 % (ref 0.0–0.2)

## 2020-04-10 LAB — COMPREHENSIVE METABOLIC PANEL
ALT: 25 U/L (ref 0–44)
AST: 36 U/L (ref 15–41)
Albumin: 3.8 g/dL (ref 3.5–5.0)
Alkaline Phosphatase: 36 U/L — ABNORMAL LOW (ref 38–126)
Anion gap: 10 (ref 5–15)
BUN: 12 mg/dL (ref 8–23)
CO2: 26 mmol/L (ref 22–32)
Calcium: 9 mg/dL (ref 8.9–10.3)
Chloride: 102 mmol/L (ref 98–111)
Creatinine, Ser: 0.88 mg/dL (ref 0.61–1.24)
GFR, Estimated: >60 mL/min (ref >60)
Glucose, Bld: 117 mg/dL — ABNORMAL HIGH (ref 70–99)
Potassium: 4.4 mmol/L (ref 3.5–5.1)
Sodium: 138 mmol/L (ref 135–145)
Total Bilirubin: 0.5 mg/dL (ref 0.3–1.2)
Total Protein: 7.8 g/dL (ref 6.5–8.1)

## 2020-04-10 LAB — PSA: Prostatic Specific Antigen: 8.27 ng/mL — ABNORMAL HIGH (ref 0.00–4.00)

## 2020-04-10 MED ORDER — LEUPROLIDE ACETATE (6 MONTH) 45 MG ~~LOC~~ KIT
45.0000 mg | PACK | Freq: Once | SUBCUTANEOUS | Status: AC
  Administered 2020-04-10: 45 mg via SUBCUTANEOUS
  Filled 2020-04-10: qty 45

## 2020-04-10 MED ORDER — HYDROCODONE-ACETAMINOPHEN 5-325 MG PO TABS: 1.0000 | ORAL_TABLET | Freq: Four times a day (QID) | ORAL | 0 refills | Status: DC | PRN

## 2020-04-10 NOTE — Progress Notes (Signed)
Pt in for follow up, reports pain in right back of hip pain.

## 2020-04-22 ENCOUNTER — Telehealth: Payer: Self-pay | Admitting: Licensed Clinical Social Worker

## 2020-04-24 ENCOUNTER — Encounter: Payer: Self-pay | Admitting: Licensed Clinical Social Worker

## 2020-04-24 ENCOUNTER — Ambulatory Visit: Payer: Self-pay | Admitting: Licensed Clinical Social Worker

## 2020-04-24 DIAGNOSIS — Z1379 Encounter for other screening for genetic and chromosomal anomalies: Secondary | ICD-10-CM

## 2020-04-24 DIAGNOSIS — C61 Malignant neoplasm of prostate: Secondary | ICD-10-CM

## 2020-04-24 NOTE — Telephone Encounter (Signed)
Revealed negative genetic testing. This normal result is reassuring and indicates that it is unlikely Mr. Evan Gonzalez cancer is due to a hereditary cause.  It is unlikely that there is an increased risk of another cancer due to a mutation in one of these genes.  However, genetic testing is not perfect, and cannot definitively rule out a hereditary cause.  It will be important for him to keep in contact with genetics to learn if any additional testing may be needed in the future.

## 2020-04-24 NOTE — Progress Notes (Signed)
HPI:  Mr. Evan Gonzalez was previously seen in the [place] clinic due to a personal history of prostate cancer and concerns regarding a hereditary predisposition to cancer. Please refer to our prior cancer genetics clinic note for more information regarding our discussion, assessment and recommendations, at the time. Mr. Evan Gonzalez recent genetic test results were disclosed to him, as were recommendations warranted by these results. These results and recommendations are discussed in more detail below.  CANCER HISTORY:  Oncology History  Prostate cancer ([place])  03/11/2019 Initial Diagnosis   Prostate cancer ([place])   04/11/2020 Cancer Staging   Staging form: Prostate, AJCC 8th Edition - Clinical stage from 04/11/2020: Stage IVB (cTX, cN0, cM1b, PSA: 213) - Signed by Lloyd Huger, MD on 04/11/2020 Prostate specific antigen (PSA) range: 20 or greater    Genetic Testing   Negative genetic testing. No pathogenic variants identified on the [facility] Common Hereditary Cancers Panel. The report date is 04/20/2020.  The Common Hereditary Cancers Panel offered by [facility] includes sequencing and/or deletion duplication testing of the following 48 genes: APC, ATM, AXIN2, BARD1, BMPR1A, BRCA1, BRCA2, BRIP1, CDH1, CDKN2A (p14ARF), CDKN2A (p16INK4a), CKD4, CHEK2, CTNNA1, DICER1, EPCAM (Deletion/duplication testing only), GREM1 (promoter region deletion/duplication testing only), KIT, MEN1, MLH1, MSH2, MSH3, MSH6, MUTYH, NBN, NF1, NHTL1, PALB2, PDGFRA, PMS2, POLD1, POLE, PTEN, RAD50, RAD51C, RAD51D, RNF43, SDHB, SDHC, SDHD, SMAD4, SMARCA4. STK11, TP53, TSC1, TSC2, and VHL.  The following genes were evaluated for sequence changes only: SDHA and HOXB13 c.251G>A variant only.     FAMILY HISTORY:  We obtained a detailed, 4-generation family history.  Significant diagnoses are listed below: Family History  Problem Relation Age of Onset  . Heart attack Mother   . Diabetes Father      Mr. Evan Gonzalez had  3 sisters and 2 brothers, one sister passed at age 55 but he is unsure the cause. He does not have children but does have nieces/nephews.  Mr. Evan Gonzalez mother died in her 17s due to heart issues. No reported family history of cancer on this side.  Mr. Evan Gonzalez father died in his 69s, no reported family history of cancer on this side either.   Mr. Evan Gonzalez is unaware of previous family history of genetic testing for hereditary cancer risks. Patient's maternal ancestors are of African American descent, and paternal ancestors are of African American descent. There is no reported Ashkenazi Jewish ancestry. There is no known consanguinity.  GENETIC TEST RESULTS: Genetic testing reported out on 04/20/2020 through the [facility] Common Hereditary cancer panel found no pathogenic mutations.   The Common Hereditary Cancers Panel offered by [facility] includes sequencing and/or deletion duplication testing of the following 48 genes: APC, ATM, AXIN2, BARD1, BMPR1A, BRCA1, BRCA2, BRIP1, CDH1, CDKN2A (p14ARF), CDKN2A (p16INK4a), CKD4, CHEK2, CTNNA1, DICER1, EPCAM (Deletion/duplication testing only), GREM1 (promoter region deletion/duplication testing only), KIT, MEN1, MLH1, MSH2, MSH3, MSH6, MUTYH, NBN, NF1, NHTL1, PALB2, PDGFRA, PMS2, POLD1, POLE, PTEN, RAD50, RAD51C, RAD51D, RNF43, SDHB, SDHC, SDHD, SMAD4, SMARCA4. STK11, TP53, TSC1, TSC2, and VHL.  The following genes were evaluated for sequence changes only: SDHA and HOXB13 c.251G>A variant only.   The test report has been scanned into EPIC and is located under the Molecular Pathology section of the Results Review tab.  A portion of the result report is included below for reference.     We discussed with Mr. Evan Gonzalez that because current genetic testing is not perfect, it is possible there may be a gene mutation in one of these genes that current  testing cannot detect, but that chance is small.  We also discussed, that there could be another gene that has not yet  been discovered, or that we have not yet tested, that is responsible for the cancer diagnoses in the family. It is also possible there is a hereditary cause for the cancer in the family that Mr. Evan Gonzalez did not inherit and therefore was not identified in his testing.  Therefore, it is important to remain in touch with cancer genetics in the future so that we can continue to offer Mr. Evan Gonzalez the most up to date genetic testing.   ADDITIONAL GENETIC TESTING: We discussed with Mr. Evan Gonzalez that his genetic testing was fairly extensive.  If there are genes identified to increase cancer risk that can be analyzed in the future, we would be happy to discuss and coordinate this testing at that time.    CANCER SCREENING RECOMMENDATIONS: Mr. Evan Gonzalez test result is considered negative (normal).  This means that we have not identified a hereditary cause for his  personal and family history of cancer at this time. Most cancers happen by chance and this negative test suggests that his cancer may fall into this category.    While reassuring, this does not definitively rule out a hereditary predisposition to cancer. It is still possible that there could be genetic mutations that are undetectable by current technology. There could be genetic mutations in genes that have not been tested or identified to increase cancer risk.  Therefore, it is recommended he continue to follow the cancer management and screening guidelines provided by his oncology and primary healthcare provider.   An individual's cancer risk and medical management are not determined by genetic test results alone. Overall cancer risk assessment incorporates additional factors, including personal medical history, family history, and any available genetic information that may result in a personalized plan for cancer prevention and surveillance.  RECOMMENDATIONS FOR FAMILY MEMBERS:  Relatives in this family might be at some increased risk of developing  cancer, over the general population risk, simply due to the family history of cancer.  We recommended male relatives in this family have a yearly mammogram beginning at age 18, or 43 years younger than the earliest onset of cancer, an annual clinical breast exam, and perform monthly breast self-exams. Male relatives in this family should also have a gynecological exam as recommended by their primary provider.  All family members should be referred for colonoscopy starting at age 21.   FOLLOW-UP: Lastly, we discussed with Mr. Cyr that cancer genetics is a rapidly advancing field and it is possible that new genetic tests will be appropriate for him and/or his family members in the future. We encouraged him to remain in contact with cancer genetics on an annual basis so we can update his personal and family histories and let him know of advances in cancer genetics that may benefit this family.   Our contact number was provided. Mr. Evan Gonzalez questions were answered to his satisfaction, and he knows he is welcome to call us at anytime with additional questions or concerns.   Faith Rogue, MS, [facility] Genetic Counselor [email or web address] Phone: [phone]

## 2020-05-09 ENCOUNTER — Other Ambulatory Visit: Payer: Self-pay | Admitting: *Deleted

## 2020-05-09 DIAGNOSIS — R591 Generalized enlarged lymph nodes: Secondary | ICD-10-CM

## 2020-05-10 ENCOUNTER — Other Ambulatory Visit: Payer: Self-pay | Admitting: Oncology

## 2020-05-10 DIAGNOSIS — R591 Generalized enlarged lymph nodes: Secondary | ICD-10-CM

## 2020-05-10 MED ORDER — HYDROCODONE-ACETAMINOPHEN 5-325 MG PO TABS: 1.0000 | ORAL_TABLET | Freq: Four times a day (QID) | ORAL | 0 refills | Status: DC | PRN

## 2020-05-10 NOTE — Progress Notes (Signed)
CV:UDTHYH  Patient requesting refill on Norco 5-325 mg tablets every 6 hours as needed for moderate pain.  Per PDMP patient's last refill was on 04/10/2020-quantity 30tabs.   Okay for refill.     Faythe Casa, NP 05/10/2020 1:08 PM

## 2020-07-12 ENCOUNTER — Ambulatory Visit: Admit: 2020-07-12 | Payer: Medicare Other

## 2020-07-12 ENCOUNTER — Ambulatory Visit
Admission: EM | Admit: 2020-07-12 | Discharge: 2020-07-12 | Disposition: A | Discharge status: Home / Self Care | Payer: Medicare Other | Attending: Sports Medicine | Admitting: Sports Medicine

## 2020-07-12 ENCOUNTER — Other Ambulatory Visit: Payer: Self-pay

## 2020-07-12 ENCOUNTER — Encounter: Payer: Self-pay | Admitting: Emergency Medicine

## 2020-07-12 DIAGNOSIS — R1032 Left lower quadrant pain: Secondary | ICD-10-CM | POA: Diagnosis present

## 2020-07-12 DIAGNOSIS — R109 Unspecified abdominal pain: Secondary | ICD-10-CM | POA: Insufficient documentation

## 2020-07-12 DIAGNOSIS — C61 Malignant neoplasm of prostate: Secondary | ICD-10-CM

## 2020-07-12 DIAGNOSIS — R10A2 Flank pain, left side: Secondary | ICD-10-CM

## 2020-07-12 LAB — CBC WITH DIFFERENTIAL/PLATELET
Abs Immature Granulocytes: 0.01 10*3/uL (ref 0.00–0.07)
Basophils Absolute: 0 10*3/uL (ref 0.0–0.1)
Basophils Relative: 0 %
Eosinophils Absolute: 0.2 10*3/uL (ref 0.0–0.5)
Eosinophils Relative: 3 %
HCT: 38.9 % — ABNORMAL LOW (ref 39.0–52.0)
Hemoglobin: 12.7 g/dL — ABNORMAL LOW (ref 13.0–17.0)
Immature Granulocytes: 0 %
Lymphocytes Relative: 37 %
Lymphs Abs: 2.5 10*3/uL (ref 0.7–4.0)
MCH: 30.2 pg (ref 26.0–34.0)
MCHC: 32.6 g/dL (ref 30.0–36.0)
MCV: 92.6 fL (ref 80.0–100.0)
Monocytes Absolute: 0.7 10*3/uL (ref 0.1–1.0)
Monocytes Relative: 11 %
Neutro Abs: 3.4 10*3/uL (ref 1.7–7.7)
Neutrophils Relative %: 49 %
Platelets: 227 10*3/uL (ref 150–400)
RBC: 4.2 MIL/uL — ABNORMAL LOW (ref 4.22–5.81)
RDW: 13.4 % (ref 11.5–15.5)
WBC: 6.8 10*3/uL (ref 4.0–10.5)
nRBC: 0 % (ref 0.0–0.2)

## 2020-07-12 LAB — URINALYSIS, COMPLETE (UACMP) WITH MICROSCOPIC
Bacteria, UA: NONE SEEN
Glucose, UA: NEGATIVE mg/dL
Leukocytes,Ua: NEGATIVE
Nitrite: NEGATIVE
Protein, ur: NEGATIVE mg/dL
Specific Gravity, Urine: 1.025 (ref 1.005–1.030)
pH: 5.5 (ref 5.0–8.0)

## 2020-07-12 LAB — COMPREHENSIVE METABOLIC PANEL
ALT: 25 U/L (ref 0–44)
AST: 37 U/L (ref 15–41)
Albumin: 3.7 g/dL (ref 3.5–5.0)
Alkaline Phosphatase: 36 U/L — ABNORMAL LOW (ref 38–126)
Anion gap: 7 (ref 5–15)
BUN: 11 mg/dL (ref 8–23)
CO2: 28 mmol/L (ref 22–32)
Calcium: 8.9 mg/dL (ref 8.9–10.3)
Chloride: 100 mmol/L (ref 98–111)
Creatinine, Ser: 0.87 mg/dL (ref 0.61–1.24)
GFR, Estimated: >60 mL/min (ref >60)
Glucose, Bld: 140 mg/dL — ABNORMAL HIGH (ref 70–99)
Potassium: 4.2 mmol/L (ref 3.5–5.1)
Sodium: 135 mmol/L (ref 135–145)
Total Bilirubin: 0.6 mg/dL (ref 0.3–1.2)
Total Protein: 7.6 g/dL (ref 6.5–8.1)

## 2020-07-12 NOTE — ED Provider Notes (Signed)
[facility]    CSN: [id number] Arrival date & time: 07/12/20  1511      History   Chief Complaint Chief Complaint  Patient presents with  . Abdominal Pain  . Back Pain    HPI Evan Gonzalez is a 80 y.o. male.   Patient is a pleasant 80 year old male who presents for evaluation of left-sided lower abdominal pain and left-sided flank pain.  His primary care office is on [address] in [place].  He was actually seen there 2 days ago but they did not deal with this particular issue.  I reviewed his chart extensively.  Complicating her situation is he has a history of prostate cancer.  He did have a CT of his abdomen performed here several years ago that revealed lymphadenopathy.  He was sent to the oncology and subsequently diagnosed with prostate cancer.  He has not had surgery.  He has not had a radical prostatectomy.  He gets outpatient treatment.  Regarding his current situation its been present for 3 days he denies any fever shakes chills.  No nausea vomiting or diarrhea.  He has not had any abdominal surgeries.  He has no history of diverticular disease.  He does have some left-sided flank pain but denies any hematuria dysuria increased urinary frequency or urgency.  He denies any changes in bowel habits.  No blood in stool.  No chest pain or shortness of breath.  No red flag signs or symptoms elicited on history.     Past Medical History:  Diagnosis Date  . Diabetes mellitus without complication ([place])   . Glaucoma   . Hyperlipidemia   . Hypertension     Patient Active Problem List   Diagnosis Date Noted  . Genetic testing 04/24/2020  . Prostate cancer ([place]) 03/11/2019  . Lymphadenopathy 02/21/2019  . Bone lesion 02/21/2019  . Acute right flank pain 01/30/2019  . Vertigo 03/23/2018  . Acute right-sided low back pain with right-sided sciatica 10/06/2017  . Impacted cerumen of left ear 10/06/2017  . 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  Aortic atherosclerosis ([place]) 10/22/2015  . Benign paroxysmal positional vertigo due to bilateral vestibular disorder 09/18/2015  . 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 Essential hypertension 09/18/2015  . Risk for falls 08/29/2015  . Pulmonary hypertension ([place]) 12/07/2012  . Dyspnea 10/05/2012  . Headache 10/05/2012  . Leg swelling 10/05/2012  . Cerebrovascular disease 07/21/2012  . Constipation 07/21/2012  . Diabetes ([place]) 07/21/2012  . OSA on CPAP 07/21/2012  . Osteoarthritis of both hips 07/21/2012  . Complete rupture of rotator cuff 04/08/2012    Past Surgical History:  Procedure Laterality Date  . EYE SURGERY    . IR BONE TUMOR(S)RF ABLATION  03/28/2019  . IR KYPHO THORACIC WITH BONE BIOPSY  03/28/2019  . IR RADIOLOGIST EVAL & MGMT  03/21/2019  . IR RADIOLOGIST EVAL & MGMT  04/11/2019       Home Medications    Prior to Admission medications   Medication Sig Start Date End Date Taking? Authorizing Provider  acetaminophen (TYLENOL) 650 MG CR tablet Take 650 mg by mouth 2 (two) times daily.   Yes [provider]  amLODipine (NORVASC) 5 MG tablet  01/23/20  Yes [provider]  cetirizine (ZYRTEC) 10 MG tablet Take 10 mg by mouth daily.   Yes [provider]  dipyridamole-aspirin (AGGRENOX) 200-25 MG 12hr capsule Take 1 capsule by mouth 2 (two) times daily.   Yes [provider]  latanoprost (XALATAN) 0.005 % ophthalmic solution Place 1 drop into both eyes at bedtime.   Yes [provider]  lisinopril-hydrochlorothiazide (ZESTORETIC) 10-12.5 MG tablet  01/23/20  Yes [provider]  metFORMIN (GLUCOPHAGE) 500 MG tablet Take 500 mg by mouth 2 (two) times daily with a meal.   Yes [provider]  simvastatin (ZOCOR) 20 MG tablet Take 20 mg by mouth at bedtime.   Yes [provider]  timolol (TIMOPTIC) 0.25 % ophthalmic solution  07/28/19  Yes [provider]  benzonatate (TESSALON) 200 MG  capsule Take 1 capsule (200 mg total) by mouth 3 (three) times daily as needed for cough. Patient not taking: No sig reported 02/07/17   Melynda Ripple, MD  Brinzolamide-Brimonidine [facility]) 1-0.2 % SUSP Simbrinza 1 %-0.2 % eye drops,suspension    [provider]  dorzolamide (TRUSOPT) 2 % ophthalmic solution Place 1 drop into both eyes 2 (two) times daily.    [provider]  fluticasone (FLONASE) 50 MCG/ACT nasal spray Place 2 sprays into both nostrils daily. 02/07/17   Melynda Ripple, MD  glipiZIDE (GLUCOTROL) 5 MG tablet Take 5 mg by mouth daily before breakfast.    [provider]  HYDROcodone-acetaminophen (NORCO) 5-325 MG tablet Take 1 tablet by mouth every 6 (six) hours as needed for moderate pain. Take 2 at night as needed. 05/10/20   Jacquelin Hawking, NP  meclizine (ANTIVERT) 25 MG tablet Take 1 tablet (25 mg total) by mouth 3 (three) times daily as needed for dizziness or nausea. Patient not taking: No sig reported 04/03/19   Carrie Mew, MD  NONFORMULARY OR COMPOUNDED ITEM See pharmacy note Patient not taking: No sig reported 11/02/19   Gardiner Barefoot, DPM  ondansetron (ZOFRAN ODT) 4 MG disintegrating tablet Take 1 tablet (4 mg total) by mouth every 8 (eight) hours as needed for nausea or vomiting. Patient not taking: No sig reported 04/03/19   Carrie Mew, MD    Family History Family History  Problem Relation Age of Onset  . Heart attack Mother   . Diabetes Father     Social History Social History   Tobacco Use  . Smoking status: Former Smoker    Quit date: 02/08/1987    Years since quitting: 33.4  . Smokeless tobacco: Never Used  Vaping Use  . Vaping Use: Never used  Substance Use Topics  . Alcohol use: No  . Drug use: No     Allergies   Patient has no known allergies.   Review of Systems Review of Systems  Constitutional: Negative for activity change, appetite change, chills, diaphoresis, fatigue and fever.   HENT: Negative for congestion, ear pain, postnasal drip, rhinorrhea, sinus pressure, sinus pain, sneezing and sore throat.   Eyes: Negative for pain.  Respiratory: Negative for cough, chest tightness and shortness of breath.   Cardiovascular: Negative for chest pain and palpitations.  Gastrointestinal: Positive for abdominal pain. Negative for diarrhea, nausea and vomiting.  Genitourinary: Positive for flank pain. Negative for dysuria, frequency, hematuria, penile discharge, penile pain, penile swelling, scrotal swelling, testicular pain and urgency.  Musculoskeletal: Negative for back pain, myalgias and neck pain.  Skin: Negative for color change, pallor, rash and wound.  Neurological: Negative for dizziness, light-headedness and headaches.  All other systems reviewed and are negative.    Physical Exam Triage Vital Signs ED Triage Vitals  Enc Vitals Group     BP 07/12/20 1524 130/66     Pulse Rate 07/12/20 1524 89  Resp 07/12/20 1524 16     Temp 07/12/20 1524 97.6 F (36.4 C)     Temp Source 07/12/20 1524 Oral     SpO2 07/12/20 1524 96 %     Weight 07/12/20 1522 274 lb 14.6 oz (124.7 kg)     Height 07/12/20 1522 _0  (1.803 m)     Head Circumference --      Peak Flow --      Pain Score 07/12/20 1522 0     Pain Loc --      Pain Edu? --      Excl. in [place]? --    No data found.  Updated Vital Signs BP 130/66 (BP Location: Left Arm)   Pulse 89   Temp 97.6 F (36.4 C) (Oral)   Resp 16   Ht _1  (1.803 m)   Wt 124.7 kg   SpO2 96%   BMI 38.34 kg/m   Visual Acuity Right Eye Distance:   Left Eye Distance:   Bilateral Distance:    Right Eye Near:   Left Eye Near:    Bilateral Near:     Physical Exam Vitals and nursing note reviewed.  Constitutional:      General: He is not in acute distress.    Appearance: Normal appearance. He is well-developed. He is obese. He is not ill-appearing, toxic-appearing or diaphoretic.  HENT:     Head: Normocephalic and  atraumatic.     Nose: Nose normal.     Mouth/Throat:     Mouth: Mucous membranes are moist.  Eyes:     General: No scleral icterus.    Extraocular Movements: Extraocular movements intact.     Conjunctiva/sclera: Conjunctivae normal.     Pupils: Pupils are equal, round, and reactive to light.  Cardiovascular:     Rate and Rhythm: Normal rate and regular rhythm.     Pulses: Normal pulses.     Heart sounds: Normal heart sounds. No murmur heard. No friction rub. No gallop.   Pulmonary:     Effort: Pulmonary effort is normal.     Breath sounds: Normal breath sounds. No stridor. No wheezing, rhonchi or rales.  Abdominal:     General: Bowel sounds are normal. There is no distension.     Palpations: Abdomen is soft. There is no shifting dullness, fluid wave, hepatomegaly or splenomegaly.     Tenderness: There is abdominal tenderness in the left lower quadrant. There is left CVA tenderness. There is no right CVA tenderness, guarding or rebound. Negative signs include Murphy's sign, Rovsing's sign, McBurney's sign, psoas sign and obturator sign.  Musculoskeletal:     Cervical back: Normal range of motion and neck supple.  Skin:    General: Skin is warm and dry.     Capillary Refill: Capillary refill takes less than 2 seconds.     Findings: No erythema or rash.  Neurological:     General: No focal deficit present.     Mental Status: He is alert and oriented to person, place, and time.      UC Treatments / Results  Labs (all labs ordered are listed, but only abnormal results are displayed) Labs Reviewed  CBC WITH DIFFERENTIAL/PLATELET - Abnormal; Notable for the following components:      Result Value   RBC 4.20 (*)    Hemoglobin 12.7 (*)    HCT 38.9 (*)    All other components within normal limits  COMPREHENSIVE METABOLIC PANEL - Abnormal; Notable for the following components:  Glucose, Bld 140 (*)    Alkaline Phosphatase 36 (*)    All other components within normal limits   URINALYSIS, COMPLETE (UACMP) WITH MICROSCOPIC - Abnormal; Notable for the following components:   Hgb urine dipstick SMALL (*)    Bilirubin Urine SMALL (*)    Ketones, ur TRACE (*)    All other components within normal limits    EKG   Radiology No results found.  Procedures Procedures (including critical care time)  Medications Ordered in UC Medications - No data to display  Initial Impression / Assessment and Plan / UC Course  I have reviewed the triage vital signs and the nursing notes.  Pertinent labs & imaging results that were available during my care of the patient were reviewed by me and considered in my medical decision making (see chart for details).   Clinical impression: 1.  left-sided lower abdominal pain and left-sided flank pain for about 3 days. 2.  History of prostate cancer currently being followed by oncology. 3.  Complicated past medical history with multiple issues.  Treatment plan: 1.  The findings and treatment plan were discussed in detail with the patient.  Patient was in agreement. 2.  Recommended getting a UA.  It did not show that he had a UTI.  He had a small amount of blood, small amount of bilirubin and trace ketones.  No bacteria. 3.  Recommended getting c-Met.  His electrolytes were normal.  Glucose was mildly elevated.  Alk phos was mildly low.  Electrolytes, renal function, and liver function all within normal limits. 4.  Recommend getting CBC.  He is a little anemic with an H&H of 12.7 and 38.9.  Based on prior studies that is pretty much baseline for him. 5.  Recommended getting a CT scan of his abdomen.  Discussed this with radiology.  We have a Producer, television/film/video of IV contrast but p.o. contrast was indicated.  We will send him to the hospital for that scan. 6.  Educational handouts provided. 7.  He should let his primary care physician know as well as his oncologist that he presented to the urgent care with the symptoms that he has. 8.  If  his symptoms worsen he knows to go to the ER. 9.  He was discharged from care in stable condition and will go get a CT of his abdomen.    Final Clinical Impressions(s) / UC Diagnoses   Final diagnoses:  Abdominal pain, left lower quadrant  Left flank pain  Prostate cancer [facility])     Discharge Instructions     As we discussed, your urine does not show that you have a urinary tract infection.  Your other blood work shows a mild elevation in your glucose and a mild decrease in your hemoglobin.  These are consistent with other values that you have had on other labs.  Nothing to explain the acute onset of your abdominal pain. We have ordered a outpatient CT scan of your abdomen and pelvis.  Please attend that visit.  Someone will contact you with any abnormal results. Please update your primary care provider that you were seen in the urgent care and that your labs were essentially normal and that you are having a CT scan of your abdomen and pelvis. If your symptoms worsen in any way then please go to the emergency room.    ED Prescriptions    None     PDMP not reviewed this encounter.   Verda Cumins, MD 07/12/20  [place]

## 2020-07-12 NOTE — ED Triage Notes (Signed)
Patient c/o left lower back and abdomen that started 3 days.  Patient denies N/V/D.  Patient denies urinary symptoms. Patient denies fevers.

## 2020-07-12 NOTE — Discharge Instructions (Addendum)
As we discussed, your urine does not show that you have a urinary tract infection.  Your other blood work shows a mild elevation in your glucose and a mild decrease in your hemoglobin.  These are consistent with other values that you have had on other labs.  Nothing to explain the acute onset of your abdominal pain. We have ordered a outpatient CT scan of your abdomen and pelvis.  Please attend that visit.  Someone will contact you with any abnormal results. Please update your primary care provider that you were seen in the urgent care and that your labs were essentially normal and that you are having a CT scan of your abdomen and pelvis. If your symptoms worsen in any way then please go to the emergency room.

## 2020-09-26 ENCOUNTER — Ambulatory Visit (INDEPENDENT_AMBULATORY_CARE_PROVIDER_SITE_OTHER): Payer: Medicare Other | Admitting: Podiatry

## 2020-09-26 ENCOUNTER — Encounter: Payer: Self-pay | Admitting: Podiatry

## 2020-09-26 ENCOUNTER — Other Ambulatory Visit: Payer: Self-pay

## 2020-09-26 DIAGNOSIS — B351 Tinea unguium: Secondary | ICD-10-CM

## 2020-09-26 DIAGNOSIS — Z794 Long term (current) use of insulin: Secondary | ICD-10-CM

## 2020-09-26 DIAGNOSIS — E1142 Type 2 diabetes mellitus with diabetic polyneuropathy: Secondary | ICD-10-CM

## 2020-09-26 DIAGNOSIS — M79675 Pain in left toe(s): Secondary | ICD-10-CM

## 2020-09-26 DIAGNOSIS — M79674 Pain in right toe(s): Secondary | ICD-10-CM

## 2020-09-26 NOTE — Progress Notes (Signed)
This patient returns to my office for at risk foot care.  This patient requires this care by a professional since this patient will be at risk due to having diabetes.   This patient is unable to cut nails himself since the patient cannot reach his nails.These nails are painful walking and wearing shoes.  This patient presents for at risk foot care today.  General Appearance  Alert, conversant and in no acute stress.  Vascular  Dorsalis pedis and posterior tibial  pulses are palpable  bilaterally.  Capillary return is within normal limits  bilaterally. Temperature is within normal limits  bilaterally.  Neurologic  Senn-Weinstein monofilament wire test within normal limits  bilaterally. Muscle power within normal limits bilaterally.  Nails Thick disfigured discolored nails with subungual debris  from hallux to fifth toes bilaterally. No evidence of bacterial infection or drainage bilaterally.  Orthopedic  No limitations of motion  feet .  No crepitus or effusions noted.  HAV  B/L.  Hammer toes  B/L.  Skin  normotropic skin with no porokeratosis noted bilaterally.  No signs of infections or ulcers noted.     Onychomycosis  Pain in right toes  Pain in left eg/foot swelling   Consent was obtained for treatment procedures.   Mechanical debridement of nails 1-5  bilaterally performed with a nail nipper.  Filed with dremel without incident. Pain medicine to be called in for this patient.   Return office visit  3 months                   Told patient to return for periodic foot care and evaluation due to potential at risk complications.  Gardiner Barefoot DPM   Gardiner Barefoot DPM

## 2020-10-04 NOTE — Progress Notes (Signed)
[place]  [phone] Fax:(336) [phone]  ID: Evan Gonzalez OB: [birth year]  MR#: [id number]  JQB#:[id number]  Patient Care Team: Theotis Burrow, MD as PCP - General (Family Medicine) Lloyd Huger, MD as Consulting Physician (Hematology and Oncology)  CHIEF COMPLAINT: Stage IVB prostate cancer.  INTERVAL HISTORY: Patient returns to clinic today for repeat laboratory work, routine 37-month evaluation, and continuation of Eligard.  He continues to feel well and remains asymptomatic. He denies any pain. He has no neurologic complaints.  He denies any recent fevers or illnesses.  He has a good appetite and denies weight loss.  He has no chest pain, shortness of breath, cough, or hemoptysis.  He denies any nausea, vomiting, constipation or diarrhea.  He has no urinary complaints.  Patient feels at his baseline and offers no specific complaints today.  REVIEW OF SYSTEMS:   Review of Systems  Constitutional: Negative.  Negative for fever and malaise/fatigue.  Respiratory: Negative.  Negative for cough and shortness of breath.   Cardiovascular: Negative.  Negative for chest pain and leg swelling.  Gastrointestinal: Negative.  Negative for abdominal pain and diarrhea.  Genitourinary: Negative.  Negative for flank pain.  Musculoskeletal: Negative.  Negative for back pain.  Skin: Negative.  Negative for rash.  Neurological: Negative.  Negative for dizziness, focal weakness, weakness and headaches.  Psychiatric/Behavioral: Negative.  The patient is not nervous/anxious.    As per HPI. Otherwise, a complete review of systems is negative.  PAST MEDICAL HISTORY: Past Medical History:  Diagnosis Date   Diabetes mellitus without complication ([facility])    Glaucoma    Hyperlipidemia    Hypertension     PAST SURGICAL HISTORY: Past Surgical History:  Procedure Laterality Date   EYE SURGERY     IR BONE TUMOR(S)RF ABLATION  03/28/2019   IR KYPHO  THORACIC WITH BONE BIOPSY  03/28/2019   IR RADIOLOGIST EVAL & MGMT  03/21/2019   IR RADIOLOGIST EVAL & MGMT  04/11/2019    FAMILY HISTORY: Family History  Problem Relation Age of Onset   Heart attack Mother    Diabetes Father     ADVANCED DIRECTIVES (Y/N):  N  HEALTH MAINTENANCE: Social History   Tobacco Use   Smoking status: Former    Types: Cigarettes    Quit date: 02/08/1987    Years since quitting: 33.6   Smokeless tobacco: Never  Vaping Use   Vaping Use: Never used  Substance Use Topics   Alcohol use: No   Drug use: No     Colonoscopy:  PAP:  Bone density:  Lipid panel:  No Known Allergies  Current Outpatient Medications  Medication Sig Dispense Refill   acetaminophen (TYLENOL) 650 MG CR tablet Take 650 mg by mouth 2 (two) times daily.     amLODipine (NORVASC) 5 MG tablet      dipyridamole-aspirin (AGGRENOX) 200-25 MG 12hr capsule Take 1 capsule by mouth 2 (two) times daily.     dorzolamide (TRUSOPT) 2 % ophthalmic solution Place 1 drop into both eyes 2 (two) times daily.     latanoprost (XALATAN) 0.005 % ophthalmic solution Place 1 drop into both eyes at bedtime.     lisinopril-hydrochlorothiazide (ZESTORETIC) 10-12.5 MG tablet      metFORMIN (GLUCOPHAGE) 500 MG tablet Take 500 mg by mouth 2 (two) times daily with a meal.     simvastatin (ZOCOR) 20 MG tablet Take 20 mg by mouth at bedtime.     timolol (TIMOPTIC) 0.25 % ophthalmic solution  cetirizine (ZYRTEC) 10 MG tablet Take 10 mg by mouth daily. (Patient not taking: Reported on 10/08/2020)     fluticasone (FLONASE) 50 MCG/ACT nasal spray Place 2 sprays into both nostrils daily. (Patient not taking: Reported on 10/08/2020) 16 g 0   meclizine (ANTIVERT) 25 MG tablet Take 1 tablet (25 mg total) by mouth 3 (three) times daily as needed for dizziness or nausea. (Patient not taking: Reported on 10/08/2020) 30 tablet 1   NONFORMULARY OR COMPOUNDED ITEM See pharmacy note 120 each 2   ondansetron (ZOFRAN ODT) 4 MG  disintegrating tablet Take 1 tablet (4 mg total) by mouth every 8 (eight) hours as needed for nausea or vomiting. (Patient not taking: Reported on 10/08/2020) 20 tablet 0   No current facility-administered medications for this visit.    OBJECTIVE: Vitals:   10/08/20 1024  BP: 131/82  Pulse: 66  Resp: 18  Temp: 97.6 F (36.4 C)  SpO2: 98%     Body mass index is 37.1 kg/m.    ECOG FS:0 - Asymptomatic  General: Well-developed, well-nourished, no acute distress. Eyes: Pink conjunctiva, anicteric sclera. HEENT: Normocephalic, moist mucous membranes. Lungs: No audible wheezing or coughing. Heart: Regular rate and rhythm. Abdomen: Soft, nontender, no obvious distention. Musculoskeletal: No edema, cyanosis, or clubbing. Neuro: Alert, answering all questions appropriately. Cranial nerves grossly intact. Skin: No rashes or petechiae noted. Psych: Normal affect.  LAB RESULTS:  Lab Results  Component Value Date   NA 135 10/08/2020   K 3.8 10/08/2020   CL 101 10/08/2020   CO2 26 10/08/2020   GLUCOSE 95 10/08/2020   BUN 9 10/08/2020   CREATININE 0.80 10/08/2020   CALCIUM 8.7 (L) 10/08/2020   PROT 7.4 10/08/2020   ALBUMIN 3.6 10/08/2020   AST 27 10/08/2020   ALT 18 10/08/2020   ALKPHOS 31 (L) 10/08/2020   BILITOT 0.6 10/08/2020   GFRNONAA >60 10/08/2020   GFRAA >60 10/10/2019    Lab Results  Component Value Date   WBC 6.7 10/08/2020   NEUTROABS 3.4 10/08/2020   HGB 11.9 (L) 10/08/2020   HCT 35.8 (L) 10/08/2020   MCV 92.7 10/08/2020   PLT 223 10/08/2020     STUDIES: No results found.  ASSESSMENT: Stage IVB prostate cancer.  PLAN:    1.  Stage IVB prostate cancer: Biopsy and subsequent RFA of the T12 bony lesion confirmed metastatic prostate cancer.  Patient's PSA was initially 213, but now has trended down to 8.27.  Today's result is pending.  Continue with 45 mg Eligard every 6 months.  Given his minimal bony disease, he does not require Zometa at this time.   Return to clinic in 6 months with repeat laboratory work, further evaluation, and continuation of treatment. 2.  Back pain: Resolved.  RFA as above.  3.  Anemia: Mild.  Patient's most recent hemoglobin is 11.9.    I spent a total of 30 minutes reviewing chart data, face-to-face evaluation with the patient, counseling and coordination of care as detailed above.     Patient expressed understanding and was in agreement with this plan. He also understands that He can call clinic at any time with any questions, concerns, or complaints.   Cancer Staging Prostate cancer [facility]) Staging form: Prostate, AJCC 8th Edition - Clinical stage from 04/11/2020: Stage IVB (cTX, cN0, cM1b, PSA: 213) - Signed by Lloyd Huger, MD on 04/11/2020 Prostate specific antigen (PSA) range: 20 or greater   Lloyd Huger, MD   10/08/2020 1:33 PM

## 2020-10-08 ENCOUNTER — Inpatient Hospital Stay: Payer: Medicare Other | Attending: Oncology

## 2020-10-08 ENCOUNTER — Inpatient Hospital Stay: Payer: Medicare Other

## 2020-10-08 ENCOUNTER — Inpatient Hospital Stay (HOSPITAL_BASED_OUTPATIENT_CLINIC_OR_DEPARTMENT_OTHER): Payer: Medicare Other | Admitting: Oncology

## 2020-10-08 ENCOUNTER — Other Ambulatory Visit: Payer: Self-pay

## 2020-10-08 VITALS — BP 131/82 | HR 66 | Temp 97.6°F | Resp 18 | Wt 266.0 lb

## 2020-10-08 DIAGNOSIS — I10 Essential (primary) hypertension: Secondary | ICD-10-CM | POA: Diagnosis not present

## 2020-10-08 DIAGNOSIS — C7951 Secondary malignant neoplasm of bone: Secondary | ICD-10-CM | POA: Insufficient documentation

## 2020-10-08 DIAGNOSIS — Z79818 Long term (current) use of other agents affecting estrogen receptors and estrogen levels: Secondary | ICD-10-CM | POA: Diagnosis not present

## 2020-10-08 DIAGNOSIS — Z7984 Long term (current) use of oral hypoglycemic drugs: Secondary | ICD-10-CM | POA: Diagnosis not present

## 2020-10-08 DIAGNOSIS — Z87891 Personal history of nicotine dependence: Secondary | ICD-10-CM | POA: Diagnosis not present

## 2020-10-08 DIAGNOSIS — Z79899 Other long term (current) drug therapy: Secondary | ICD-10-CM | POA: Diagnosis not present

## 2020-10-08 DIAGNOSIS — E119 Type 2 diabetes mellitus without complications: Secondary | ICD-10-CM | POA: Insufficient documentation

## 2020-10-08 DIAGNOSIS — C61 Malignant neoplasm of prostate: Secondary | ICD-10-CM

## 2020-10-08 DIAGNOSIS — D649 Anemia, unspecified: Secondary | ICD-10-CM | POA: Diagnosis not present

## 2020-10-08 DIAGNOSIS — E785 Hyperlipidemia, unspecified: Secondary | ICD-10-CM | POA: Diagnosis not present

## 2020-10-08 DIAGNOSIS — Z7982 Long term (current) use of aspirin: Secondary | ICD-10-CM | POA: Diagnosis not present

## 2020-10-08 LAB — CBC WITH DIFFERENTIAL/PLATELET
Abs Immature Granulocytes: 0.01 10*3/uL (ref 0.00–0.07)
Basophils Absolute: 0 10*3/uL (ref 0.0–0.1)
Basophils Relative: 0 %
Eosinophils Absolute: 0.1 10*3/uL (ref 0.0–0.5)
Eosinophils Relative: 2 %
HCT: 35.8 % — ABNORMAL LOW (ref 39.0–52.0)
Hemoglobin: 11.9 g/dL — ABNORMAL LOW (ref 13.0–17.0)
Immature Granulocytes: 0 %
Lymphocytes Relative: 38 %
Lymphs Abs: 2.5 10*3/uL (ref 0.7–4.0)
MCH: 30.8 pg (ref 26.0–34.0)
MCHC: 33.2 g/dL (ref 30.0–36.0)
MCV: 92.7 fL (ref 80.0–100.0)
Monocytes Absolute: 0.6 10*3/uL (ref 0.1–1.0)
Monocytes Relative: 9 %
Neutro Abs: 3.4 10*3/uL (ref 1.7–7.7)
Neutrophils Relative %: 51 %
Platelets: 223 10*3/uL (ref 150–400)
RBC: 3.86 MIL/uL — ABNORMAL LOW (ref 4.22–5.81)
RDW: 14.2 % (ref 11.5–15.5)
WBC: 6.7 10*3/uL (ref 4.0–10.5)
nRBC: 0 % (ref 0.0–0.2)

## 2020-10-08 LAB — COMPREHENSIVE METABOLIC PANEL
ALT: 18 U/L (ref 0–44)
AST: 27 U/L (ref 15–41)
Albumin: 3.6 g/dL (ref 3.5–5.0)
Alkaline Phosphatase: 31 U/L — ABNORMAL LOW (ref 38–126)
Anion gap: 8 (ref 5–15)
BUN: 9 mg/dL (ref 8–23)
CO2: 26 mmol/L (ref 22–32)
Calcium: 8.7 mg/dL — ABNORMAL LOW (ref 8.9–10.3)
Chloride: 101 mmol/L (ref 98–111)
Creatinine, Ser: 0.8 mg/dL (ref 0.61–1.24)
GFR, Estimated: >60 mL/min (ref >60)
Glucose, Bld: 95 mg/dL (ref 70–99)
Potassium: 3.8 mmol/L (ref 3.5–5.1)
Sodium: 135 mmol/L (ref 135–145)
Total Bilirubin: 0.6 mg/dL (ref 0.3–1.2)
Total Protein: 7.4 g/dL (ref 6.5–8.1)

## 2020-10-08 LAB — PSA: Prostatic Specific Antigen: 4.03 ng/mL — ABNORMAL HIGH (ref 0.00–4.00)

## 2020-10-08 MED ORDER — LEUPROLIDE ACETATE (6 MONTH) 45 MG ~~LOC~~ KIT
45.0000 mg | PACK | Freq: Once | SUBCUTANEOUS | Status: AC
  Administered 2020-10-08: 45 mg via SUBCUTANEOUS
  Filled 2020-10-08: qty 45

## 2020-10-08 NOTE — Progress Notes (Signed)
Patient states that he does not sleep well and that he has been diagnosed with diverticulitis at [facility].

## 2020-12-26 IMAGING — MR MR MRA NECK WO/W CM
2 of 3 series · 28 of 48 positions shown · IV contrast (10ml Gadavist)
Comparison: No pertinent prior studies available for comparison.

EXAM:
MRA NECK WITHOUT AND WITH CONTRAST
TECHNIQUE: Multiplanar and multiecho pulse sequences of the neck were obtained
without and with intravenous contrast. Angiographic images of the
neck were obtained using MRA technique without and with intravenous
contrast.

CONTRAST:  10mL GADAVIST GADOBUTROL 1 MMOL/ML IV SOLN

[Series 9: angio_fl3d_cor_pre_ttc=2.0s · coronal · 0.9mm · 0.85mm/px · 14 of 96 slices shown]
[im 1/96]
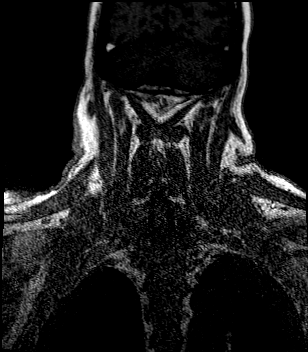
[im 8/96]
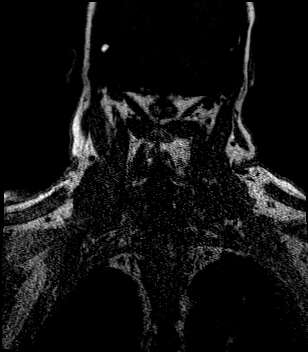
[im 15/96]
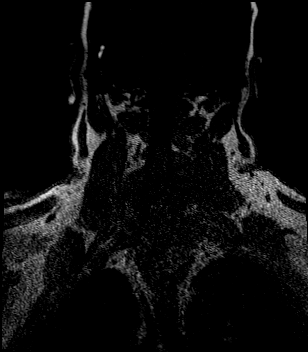
[im 22/96]
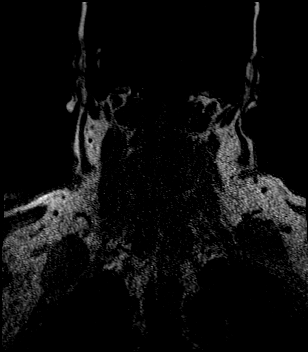
[im 30/96]
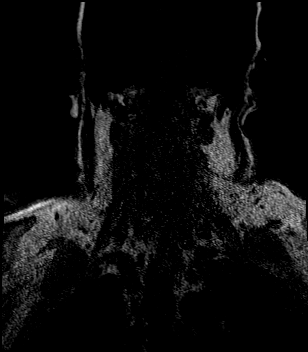
[im 37/96]
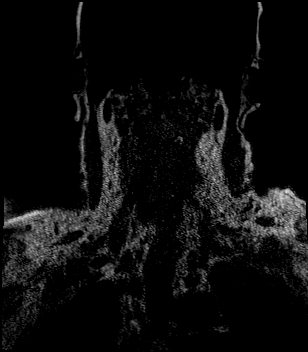
[im 44/96]
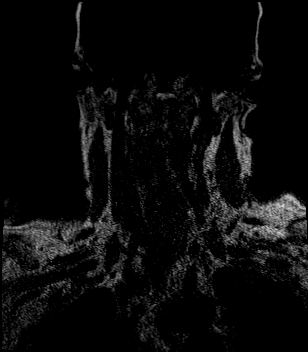
[im 52/96]
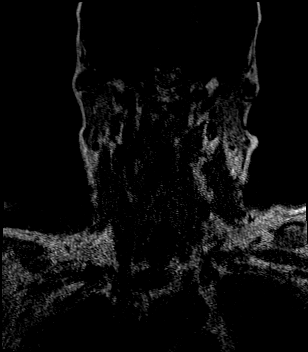
[im 59/96]
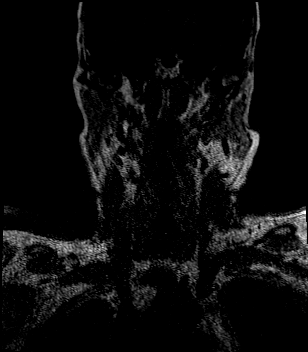
[im 66/96]
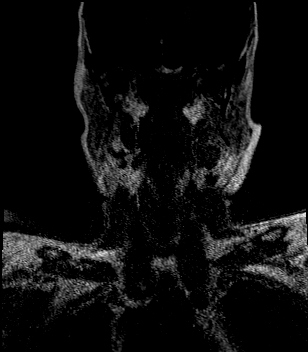
[im 74/96]
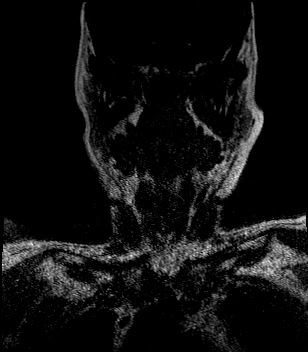
[im 81/96]
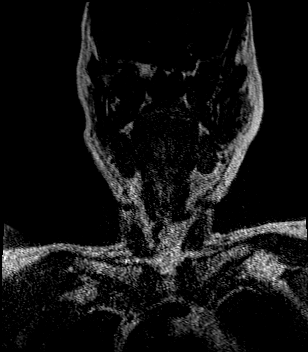
[im 88/96]
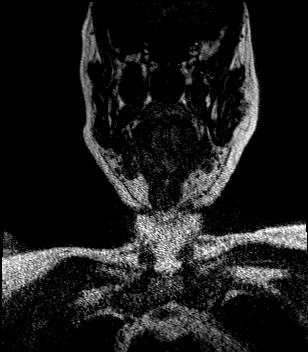
[im 96/96]
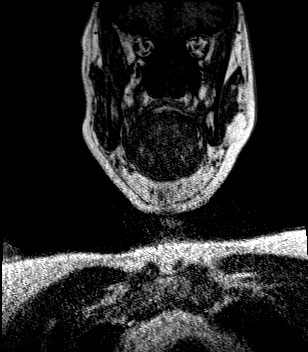

[Series 11: angio_fl3d_cor_post_ttc=2.0s · coronal · 0.9mm · 0.85mm/px · 14 of 94 slices shown]
[im 1/94]
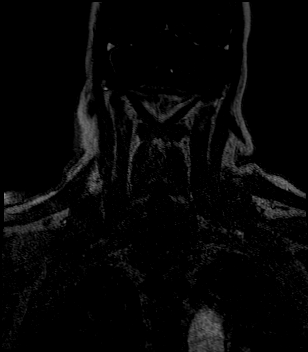
[im 8/94]
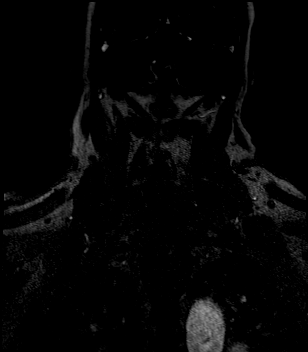
[im 15/94]
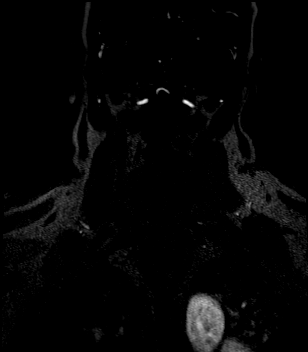
[im 22/94]
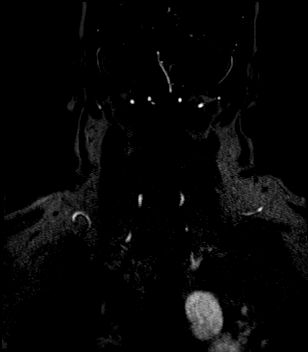
[im 29/94]
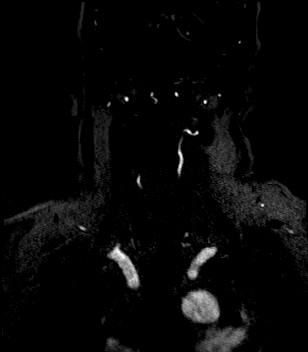
[im 36/94]
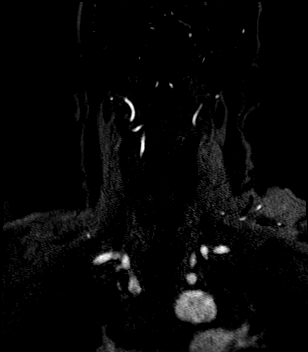
[im 43/94]
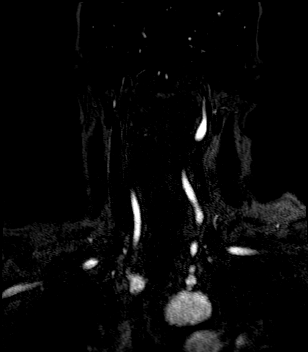
[im 51/94]
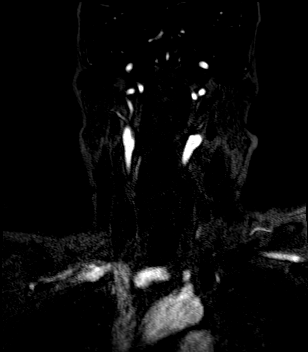
[im 58/94]
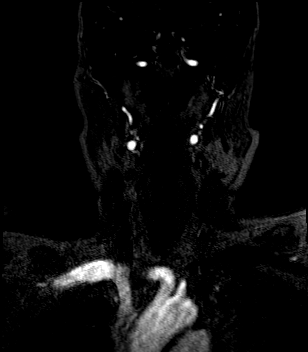
[im 65/94]
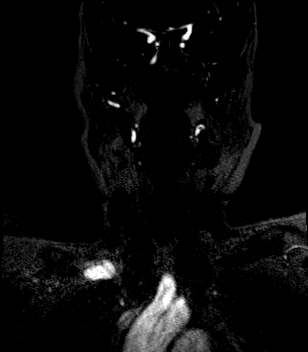
[im 72/94]
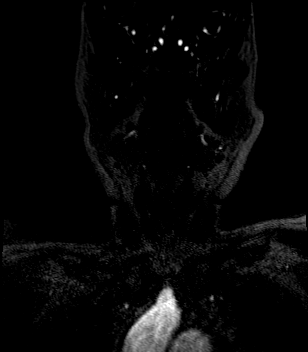
[im 79/94]
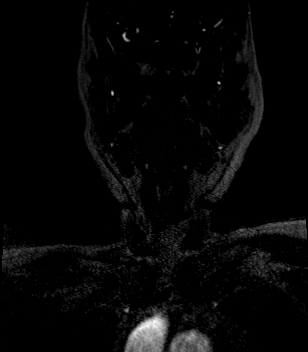
[im 86/94]
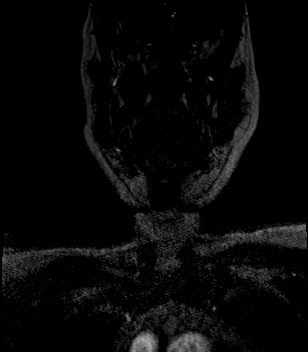
[im 94/94]
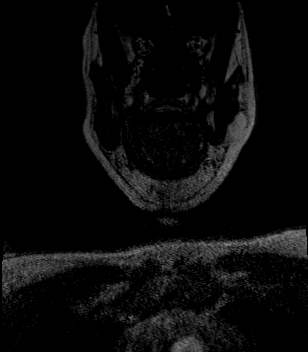

[28 of 48 positions shown; findings below may reference images not displayed]

FINDINGS: Standard aortic branching.

The bilateral common and internal carotid arteries are patent within
the neck without significant (50% or greater). The apparent
symmetric stenoses of the internal carotid arteries at the level of
the skull base demonstrated on concurrently performed noncontrast
MRA of the head are confirmed artifactual. No significant stenosis
at these sites.

The bilateral cervical vertebral arteries are developmentally
diminutive, although patent throughout the neck without stenosis.

No evidence of dissection or aneurysm.
IMPRESSION: The bilateral common carotid, internal carotid and vertebral
arteries are patent within the neck without significant stenosis. No
evidence of dissection or aneurysm.

The apparent symmetric stenoses of the internal carotid arteries at
the level of the skull base demonstrated on concurrently performed
non-contrast MRA of the head are confirmed artifactual. No
significant stenosis at these sites.

## 2020-12-26 IMAGING — MR MR MRA HEAD W/O CM
2 series · 17 of 48 positions shown · IV contrast (gadavist)
Comparison: Head CT 09/24/2017

CLINICAL DATA: Ataxia, stroke suspected, metastatic prostate
cancer, history of CVA. Headache, sudden, carotid/vertebral artery
dissection suspected.

EXAM:
MRI HEAD WITHOUT AND WITH CONTRAST
MRA HEAD WITHOUT CONTRAST
TECHNIQUE: Multiplanar, multiecho pulse sequences of the brain and surrounding
structures were obtained without and with intravenous contrast.
Angiographic images of the head were obtained using MRA technique
without contrast.
CONTRAST:  10mL GADAVIST GADOBUTROL 1 MMOL/ML IV SOLN

[Series 5: TOF · axial · 0.5mm · 0.41mm/px · z∈[-63,+34]mm · 16 of 205 slices shown]
[im 1/205]
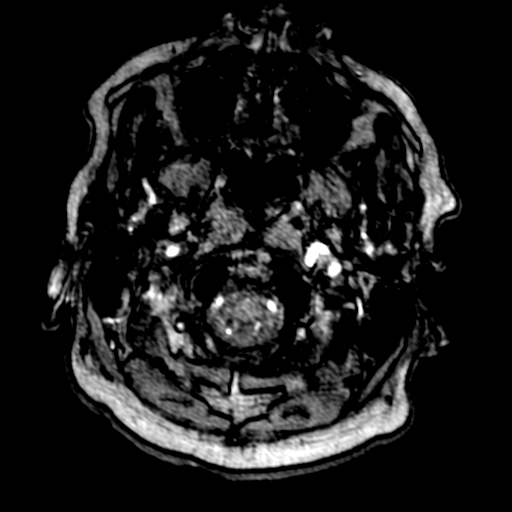
[im 5/205]
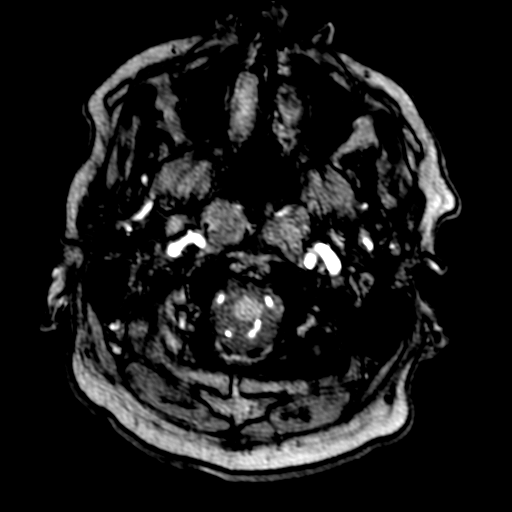
[im 9/205]
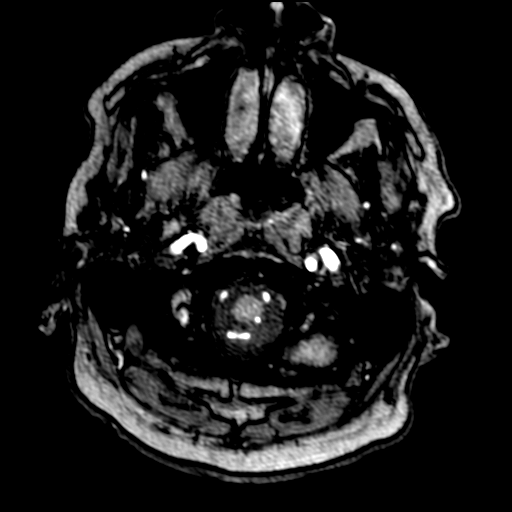
[im 14/205]
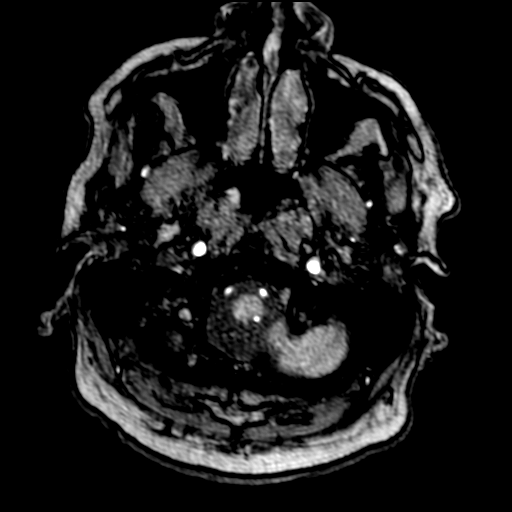
[im 18/205]
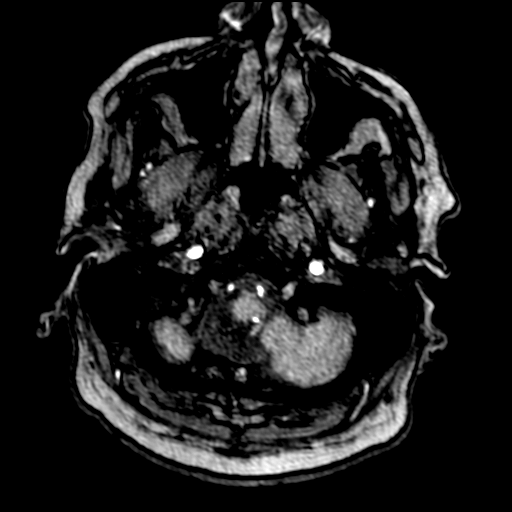
[im 23/205]
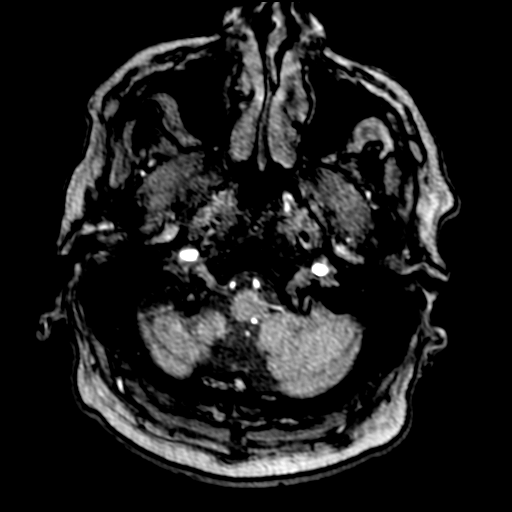
[im 32/205]
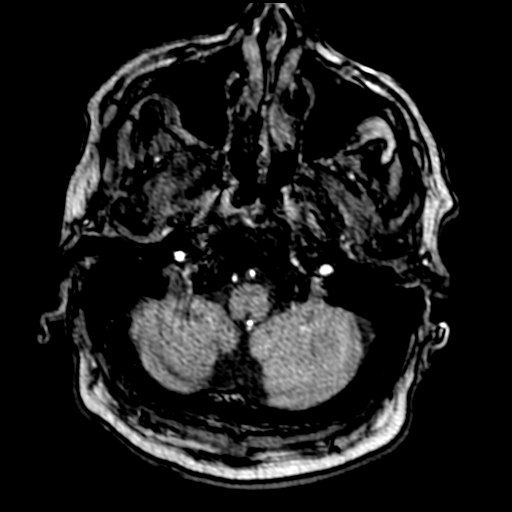
[im 36/205]
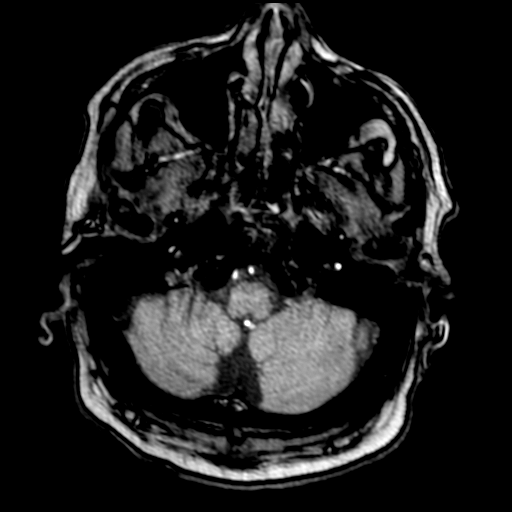
[im 63/205]
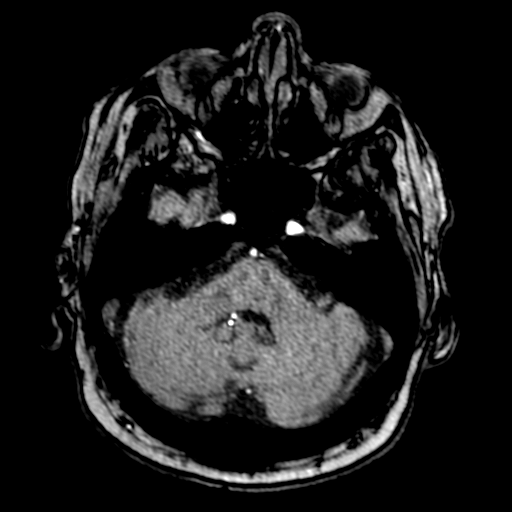
[im 89/205]
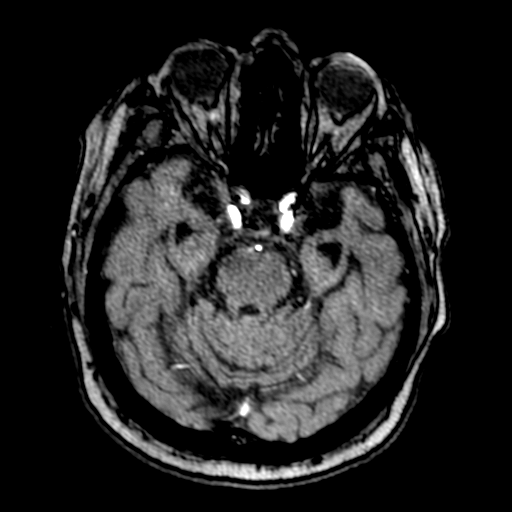
[im 103/205]
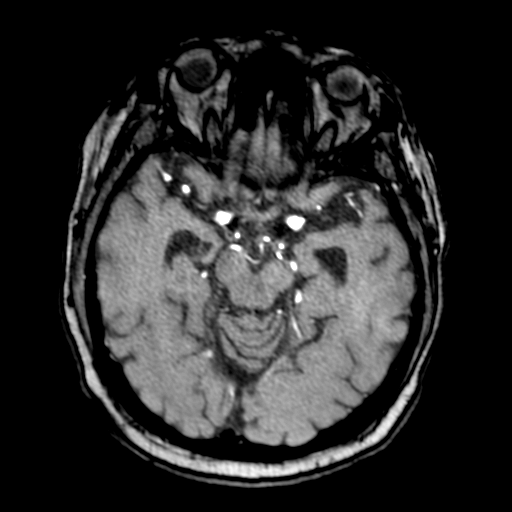
[im 116/205]
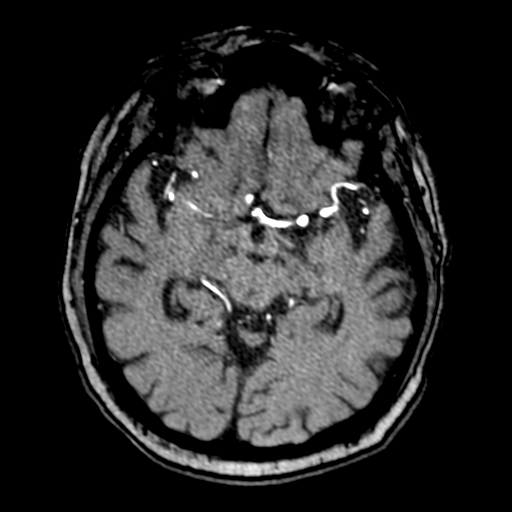
[im 142/205]
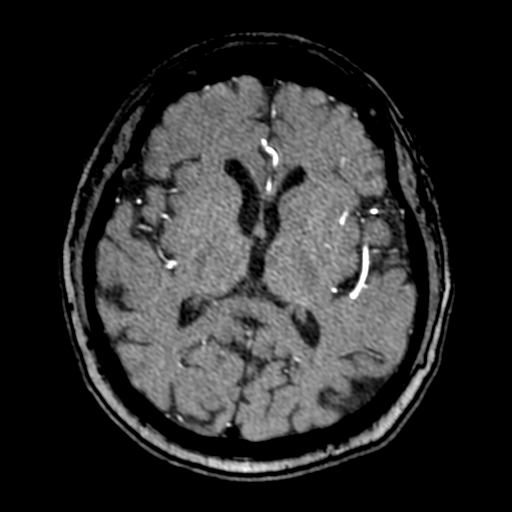
[im 169/205]
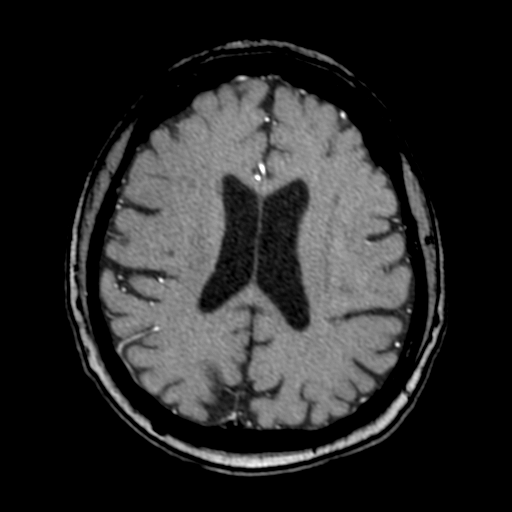
[im 173/205]
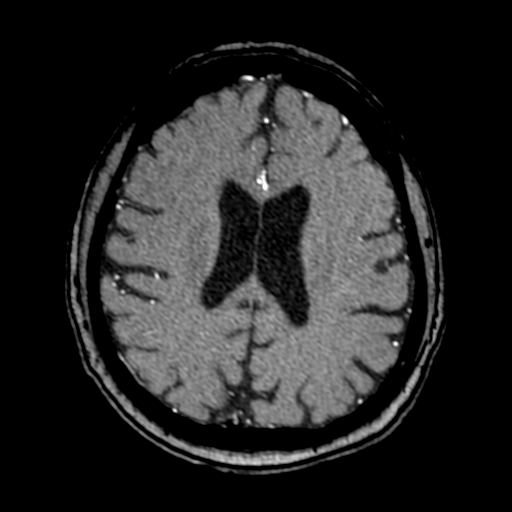
[im 196/205]
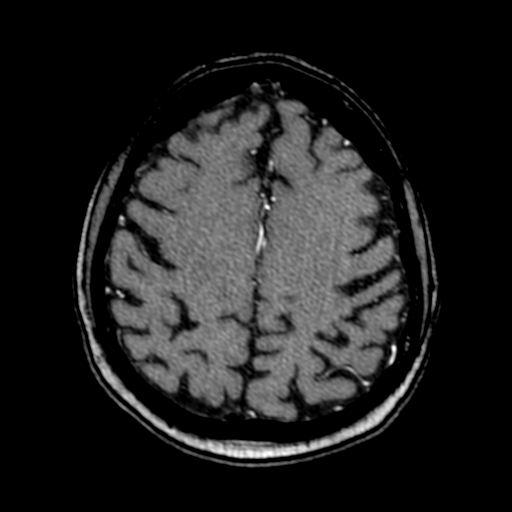

[Series 1074: tumble ant · axial · 0.4mm · 0.41mm/px · 1 of 1 slices shown]
[im 1/1]
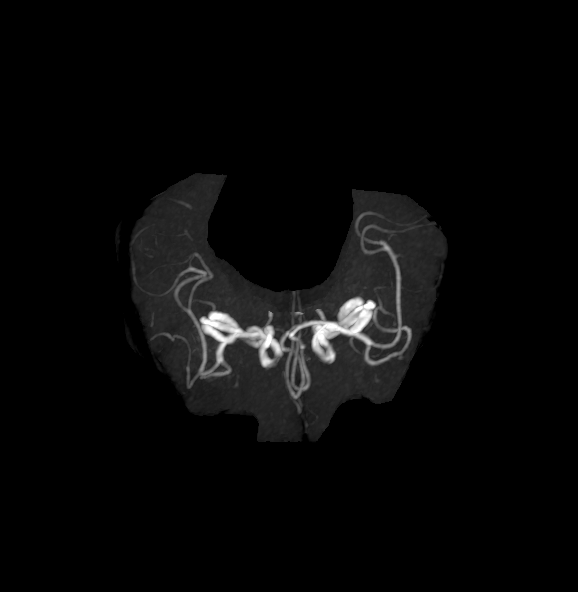

[17 of 48 positions shown; findings below may reference images not displayed]

FINDINGS: MRI HEAD FINDINGS

Brain:

Multiple sequences are mildly motion degraded.

There is no evidence of acute infarct.

No evidence of intracranial mass.

No midline shift or extra-axial fluid collection.

No definite chronic intracranial blood products.

Mild scattered T2/FLAIR hyperintensity within the cerebral white
matter is nonspecific, but consistent with chronic small vessel
ischemic disease. There are small chronic lacunar infarcts within
the bilateral cerebral white matter (series 7, image 9).

Moderate generalized parenchymal atrophy.

No abnormal intracranial enhancement is identified.

Vascular: Reported separately

Skull and upper cervical spine: No focal marrow lesion

Sinuses/Orbits: Visualized orbits demonstrate no acute abnormality.
No significant paranasal sinus disease or mastoid effusion.

MRA HEAD FINDINGS

The intracranial internal carotid arteries are patent. Symmetric
apparent paucity of flow related signal within the bilateral
internal carotid arteries at the level of the skull base is
suspected artifactual given symmetry. Stenoses at these sites cannot
be definitively excluded. Otherwise, no significant stenosis within
these vessels.

The M1 middle cerebral arteries are patent bilaterally without
significant stenosis. No M2 proximal branch occlusion or high-grade
proximal arterial stenosis is identified. The bilateral anterior
cerebral arteries are patent without high-grade proximal stenosis.
Hypoplastic A1 right ACA.

6 mm saccular aneurysm arising from the supraclinoid right ICA near
the right posterior communicating artery origin (series 5, image
95).

Diminutive vertebrobasilar system likely related to fetal origin
posterior cerebral arteries bilaterally. The intracranial vertebral
arteries are patent without significant stenosis. The basilar artery
is patent without significant stenosis. The posterior cerebral
arteries are patent without high-grade proximal stenosis.
IMPRESSION: MRI brain:

1. Mildly motion degraded study.
2. No evidence of acute intracranial abnormality, including acute
infarct.
3. No abnormal enhancement demonstrated to suggest intracranial
metastatic disease.
4. Mild chronic small vessel ischemic disease.
5. Small chronic lacunar infarcts within the cerebellar white matter
bilaterally.
6. Moderate generalized parenchymal atrophy.

MRA head:

1. Symmetric apparent paucity of flow related signal within the
bilateral internal carotid arteries at the level of the skull base
is suspected artifactual given symmetry. Stenoses at these sites
cannot be definitively excluded.
2. Otherwise, no intracranial high-grade proximal arterial stenosis
is demonstrated.
3. 6 mm saccular aneurysm arising from the supraclinoid right ICA
near the right posterior communicating artery origin.
4. Fetal origin posterior communicating arteries bilaterally.

## 2021-01-02 ENCOUNTER — Ambulatory Visit (INDEPENDENT_AMBULATORY_CARE_PROVIDER_SITE_OTHER): Payer: Medicare Other | Admitting: Podiatry

## 2021-01-02 ENCOUNTER — Other Ambulatory Visit: Payer: Self-pay

## 2021-01-02 ENCOUNTER — Encounter (INDEPENDENT_AMBULATORY_CARE_PROVIDER_SITE_OTHER): Payer: Self-pay

## 2021-01-02 ENCOUNTER — Encounter: Payer: Self-pay | Admitting: Podiatry

## 2021-01-02 DIAGNOSIS — E1142 Type 2 diabetes mellitus with diabetic polyneuropathy: Secondary | ICD-10-CM

## 2021-01-02 DIAGNOSIS — M79675 Pain in left toe(s): Secondary | ICD-10-CM | POA: Diagnosis not present

## 2021-01-02 DIAGNOSIS — M7989 Other specified soft tissue disorders: Secondary | ICD-10-CM

## 2021-01-02 DIAGNOSIS — B351 Tinea unguium: Secondary | ICD-10-CM

## 2021-01-02 DIAGNOSIS — Z794 Long term (current) use of insulin: Secondary | ICD-10-CM

## 2021-01-02 DIAGNOSIS — M79674 Pain in right toe(s): Secondary | ICD-10-CM

## 2021-01-02 NOTE — Progress Notes (Signed)
This patient returns to my office for at risk foot care.  This patient requires this care by a professional since this patient will be at risk due to having diabetes.   This patient is unable to cut nails himself since the patient cannot reach his nails.These nails are painful walking and wearing shoes.  This patient presents for at risk foot care today.  General Appearance  Alert, conversant and in no acute stress.  Vascular  Dorsalis pedis and posterior tibial  pulses are palpable  bilaterally.  Capillary return is within normal limits  bilaterally. Temperature is within normal limits  bilaterally.  Neurologic  Senn-Weinstein monofilament wire test within normal limits  bilaterally. Muscle power within normal limits bilaterally.  Nails Thick disfigured discolored nails with subungual debris  from hallux to fifth toes bilaterally. No evidence of bacterial infection or drainage bilaterally.  Orthopedic  No limitations of motion  feet .  No crepitus or effusions noted.  HAV  B/L.  Hammer toes  B/L.  Skin  normotropic skin with no porokeratosis noted bilaterally.  No signs of infections or ulcers noted.     Onychomycosis  Pain in right toes  Pain in left eg/foot swelling   Consent was obtained for treatment procedures.   Mechanical debridement of nails 1-5  bilaterally performed with a nail nipper.  Filed with dremel without incident. Pain medicine to be called in for this patient.   Return office visit  3 months                   Told patient to return for periodic foot care and evaluation due to potential at risk complications.  Gardiner Barefoot DPM

## 2021-04-03 ENCOUNTER — Ambulatory Visit (INDEPENDENT_AMBULATORY_CARE_PROVIDER_SITE_OTHER): Payer: Medicare Other | Admitting: Podiatry

## 2021-04-03 ENCOUNTER — Encounter: Payer: Self-pay | Admitting: Podiatry

## 2021-04-03 ENCOUNTER — Other Ambulatory Visit: Payer: Self-pay

## 2021-04-03 DIAGNOSIS — B351 Tinea unguium: Secondary | ICD-10-CM | POA: Diagnosis not present

## 2021-04-03 DIAGNOSIS — M7989 Other specified soft tissue disorders: Secondary | ICD-10-CM | POA: Diagnosis not present

## 2021-04-03 DIAGNOSIS — E1142 Type 2 diabetes mellitus with diabetic polyneuropathy: Secondary | ICD-10-CM

## 2021-04-03 DIAGNOSIS — M79675 Pain in left toe(s): Secondary | ICD-10-CM

## 2021-04-03 DIAGNOSIS — Z794 Long term (current) use of insulin: Secondary | ICD-10-CM

## 2021-04-03 DIAGNOSIS — M79674 Pain in right toe(s): Secondary | ICD-10-CM

## 2021-04-03 NOTE — Progress Notes (Signed)
This patient returns to my office for at risk foot care.  This patient requires this care by a professional since this patient will be at risk due to having diabetes.   This patient is unable to cut nails himself since the patient cannot reach his nails.These nails are painful walking and wearing shoes.  This patient presents for at risk foot care today.  General Appearance  Alert, conversant and in no acute stress.  Vascular  Dorsalis pedis and posterior tibial  pulses are palpable  bilaterally.  Capillary return is within normal limits  bilaterally. Temperature is within normal limits  bilaterally.  Neurologic  Senn-Weinstein monofilament wire test within normal limits  bilaterally. Muscle power within normal limits bilaterally.  Nails Thick disfigured discolored nails with subungual debris  from hallux to fifth toes bilaterally. No evidence of bacterial infection or drainage bilaterally.  Orthopedic  No limitations of motion  feet .  No crepitus or effusions noted.  HAV  B/L.  Hammer toes  B/L.  Skin  normotropic skin with no porokeratosis noted bilaterally.  No signs of infections or ulcers noted.     Onychomycosis  Pain in right toes  Pain in left eg/foot swelling   Consent was obtained for treatment procedures.   Mechanical debridement of nails 1-5  bilaterally performed with a nail nipper.  Filed with dremel without incident. Pain medicine to be called in for this patient.   Return office visit  3 months                   Told patient to return for periodic foot care and evaluation due to potential at risk complications.  Gardiner Barefoot DPM

## 2021-04-14 ENCOUNTER — Inpatient Hospital Stay: Payer: Medicare Other

## 2021-04-14 ENCOUNTER — Other Ambulatory Visit: Payer: Self-pay

## 2021-04-14 ENCOUNTER — Ambulatory Visit: Payer: Medicare Other | Admitting: Oncology

## 2021-04-14 ENCOUNTER — Other Ambulatory Visit: Payer: Medicare Other

## 2021-04-14 ENCOUNTER — Encounter: Payer: Self-pay | Admitting: Nurse Practitioner

## 2021-04-14 ENCOUNTER — Ambulatory Visit: Payer: Medicare Other

## 2021-04-14 ENCOUNTER — Inpatient Hospital Stay: Payer: Medicare Other | Attending: Oncology | Admitting: Nurse Practitioner

## 2021-04-14 VITALS — BP 116/79 | HR 81 | Temp 97.5°F | Resp 18 | Wt 257.9 lb

## 2021-04-14 DIAGNOSIS — C7951 Secondary malignant neoplasm of bone: Secondary | ICD-10-CM | POA: Diagnosis not present

## 2021-04-14 DIAGNOSIS — C61 Malignant neoplasm of prostate: Secondary | ICD-10-CM | POA: Insufficient documentation

## 2021-04-14 DIAGNOSIS — Z5111 Encounter for antineoplastic chemotherapy: Secondary | ICD-10-CM | POA: Insufficient documentation

## 2021-04-14 LAB — CBC WITH DIFFERENTIAL/PLATELET
Abs Immature Granulocytes: 0 10*3/uL (ref 0.00–0.07)
Basophils Absolute: 0 10*3/uL (ref 0.0–0.1)
Basophils Relative: 0 %
Eosinophils Absolute: 0.1 10*3/uL (ref 0.0–0.5)
Eosinophils Relative: 2 %
HCT: 37 % — ABNORMAL LOW (ref 39.0–52.0)
Hemoglobin: 11.6 g/dL — ABNORMAL LOW (ref 13.0–17.0)
Immature Granulocytes: 0 %
Lymphocytes Relative: 36 %
Lymphs Abs: 2.6 10*3/uL (ref 0.7–4.0)
MCH: 29.4 pg (ref 26.0–34.0)
MCHC: 31.4 g/dL (ref 30.0–36.0)
MCV: 93.7 fL (ref 80.0–100.0)
Monocytes Absolute: 0.6 10*3/uL (ref 0.1–1.0)
Monocytes Relative: 8 %
Neutro Abs: 4 10*3/uL (ref 1.7–7.7)
Neutrophils Relative %: 54 %
Platelets: 249 10*3/uL (ref 150–400)
RBC: 3.95 MIL/uL — ABNORMAL LOW (ref 4.22–5.81)
RDW: 13.5 % (ref 11.5–15.5)
WBC: 7.3 10*3/uL (ref 4.0–10.5)
nRBC: 0 % (ref 0.0–0.2)

## 2021-04-14 LAB — COMPREHENSIVE METABOLIC PANEL
ALT: 13 U/L (ref 0–44)
AST: 20 U/L (ref 15–41)
Albumin: 3.6 g/dL (ref 3.5–5.0)
Alkaline Phosphatase: 30 U/L — ABNORMAL LOW (ref 38–126)
Anion gap: 9 (ref 5–15)
BUN: 12 mg/dL (ref 8–23)
CO2: 28 mmol/L (ref 22–32)
Calcium: 9.4 mg/dL (ref 8.9–10.3)
Chloride: 99 mmol/L (ref 98–111)
Creatinine, Ser: 0.81 mg/dL (ref 0.61–1.24)
GFR, Estimated: >60 mL/min (ref >60)
Glucose, Bld: 111 mg/dL — ABNORMAL HIGH (ref 70–99)
Potassium: 4.4 mmol/L (ref 3.5–5.1)
Sodium: 136 mmol/L (ref 135–145)
Total Bilirubin: 0.3 mg/dL (ref 0.3–1.2)
Total Protein: 7.7 g/dL (ref 6.5–8.1)

## 2021-04-14 LAB — PSA: Prostatic Specific Antigen: 4.13 ng/mL — ABNORMAL HIGH (ref 0.00–4.00)

## 2021-04-14 MED ORDER — LEUPROLIDE ACETATE (6 MONTH) 45 MG ~~LOC~~ KIT
45.0000 mg | PACK | Freq: Once | SUBCUTANEOUS | Status: AC
  Administered 2021-04-14: 45 mg via SUBCUTANEOUS
  Filled 2021-04-14: qty 45

## 2021-04-14 NOTE — Progress Notes (Signed)
Evan Gonzalez  [phone]) [phone] [phone]  ID: [name] OB: 16-Sep-1940  MR#: [id number]  QMV#:[id number]  Patient Care Team: Theotis Burrow, MD as PCP - General (Family Medicine) Lloyd Huger, MD as Consulting Physician (Hematology and Oncology)  CHIEF COMPLAINT: Stage IVB prostate cancer.  INTERVAL HISTORY: Patient returns to clinic today for repeat laboratory work, routine 82-month evaluation, and continuation of Eligard.  He continues to feel well and remains asymptomatic. He denies any pain. He has no neurologic complaints.  He denies any recent fevers or illnesses.  He has a good appetite and denies weight loss.  He has no chest pain, shortness of breath, cough, or hemoptysis.  He denies any nausea, vomiting, constipation or diarrhea.  He has no urinary complaints.  Patient feels at his baseline and offers no specific complaints today.  REVIEW OF SYSTEMS:   Review of Systems  Constitutional: Negative.  Negative for fever and malaise/fatigue.  Respiratory: Negative.  Negative for cough and shortness of breath.   Cardiovascular: Negative.  Negative for chest pain and leg swelling.  Gastrointestinal: Negative.  Negative for abdominal pain and diarrhea.  Genitourinary: Negative.  Negative for flank pain.  Musculoskeletal: Negative.  Negative for back pain.  Skin: Negative.  Negative for rash.  Neurological: Negative.  Negative for dizziness, focal weakness, weakness and headaches.  Psychiatric/Behavioral: Negative.  The patient is not nervous/anxious.   As per HPI. Otherwise, a complete review of systems is negative.  PAST MEDICAL HISTORY: Past Medical History:  Diagnosis Date   Diabetes mellitus without complication (HCC)    Glaucoma    Hyperlipidemia    Hypertension     PAST SURGICAL HISTORY: Past Surgical History:  Procedure Laterality Date   EYE SURGERY     IR BONE TUMOR(S)RF ABLATION  03/28/2019   IR KYPHO  THORACIC WITH BONE BIOPSY  03/28/2019   IR RADIOLOGIST EVAL & MGMT  03/21/2019   IR RADIOLOGIST EVAL & MGMT  04/11/2019    FAMILY HISTORY: Family History  Problem Relation Age of Onset   Heart attack Mother    Diabetes Father     ADVANCED DIRECTIVES (Y/N):  N  HEALTH MAINTENANCE: Social History   Tobacco Use   Smoking status: Former    Types: Cigarettes    Quit date: 02/08/1987    Years since quitting: 34.2   Smokeless tobacco: Never  Vaping Use   Vaping Use: Never used  Substance Use Topics   Alcohol use: No   Drug use: No     Colonoscopy:  PAP:  Bone density:  Lipid panel:  No Known Allergies  Current Outpatient Medications  Medication Sig Dispense Refill   acetaminophen (TYLENOL) 650 MG CR tablet Take 650 mg by mouth 2 (two) times daily.     amLODipine (NORVASC) 5 MG tablet      cetirizine (ZYRTEC) 10 MG tablet Take 10 mg by mouth daily.     dipyridamole-aspirin (AGGRENOX) 200-25 MG 12hr capsule Take 1 capsule by mouth 2 (two) times daily.     dorzolamide (TRUSOPT) 2 % ophthalmic solution Place 1 drop into both eyes 2 (two) times daily.     fluticasone (FLONASE) 50 MCG/ACT nasal spray Place 2 sprays into both nostrils daily. 16 g 0   latanoprost (XALATAN) 0.005 % ophthalmic solution Place 1 drop into both eyes at bedtime.     lisinopril-hydrochlorothiazide (ZESTORETIC) 10-12.5 MG tablet      meclizine (ANTIVERT) 25 MG tablet Take 1 tablet (25 mg total)  by mouth 3 (three) times daily as needed for dizziness or nausea. 30 tablet 1   metFORMIN (GLUCOPHAGE) 500 MG tablet Take 500 mg by mouth 2 (two) times daily with a meal.     NONFORMULARY OR COMPOUNDED ITEM See pharmacy note 120 each 2   ondansetron (ZOFRAN ODT) 4 MG disintegrating tablet Take 1 tablet (4 mg total) by mouth every 8 (eight) hours as needed for nausea or vomiting. 20 tablet 0   simvastatin (ZOCOR) 20 MG tablet Take 20 mg by mouth at bedtime.     timolol (TIMOPTIC) 0.25 % ophthalmic solution       traZODone (DESYREL) 50 MG tablet Take 50 mg by mouth at bedtime.     No current facility-administered medications for this visit.    OBJECTIVE: Vitals:   04/14/21 1028  BP: 116/79  Pulse: 81  Resp: 18  Temp: (!) 97.5 F (36.4 C)  SpO2: 98%     Body mass index is 35.97 kg/m.    ECOG FS:0 - Asymptomatic  General: Well-developed, well-nourished, no acute distress. Eyes: Pink conjunctiva, anicteric sclera. HEENT: Normocephalic, moist mucous membranes. Lungs: No audible wheezing or coughing. Heart: Regular rate and rhythm. Abdomen: Soft, nontender, no obvious distention. Musculoskeletal: No edema, cyanosis, or clubbing. Neuro: Alert, answering all questions appropriately. Cranial nerves grossly intact. Skin: No rashes or petechiae noted. Psych: Normal affect.  LAB RESULTS:  Lab Results  Component Value Date   NA 136 04/14/2021   K 4.4 04/14/2021   CL 99 04/14/2021   CO2 28 04/14/2021   GLUCOSE 111 (H) 04/14/2021   BUN 12 04/14/2021   CREATININE 0.81 04/14/2021   CALCIUM 9.4 04/14/2021   PROT 7.7 04/14/2021   ALBUMIN 3.6 04/14/2021   AST 20 04/14/2021   ALT 13 04/14/2021   ALKPHOS 30 (L) 04/14/2021   BILITOT 0.3 04/14/2021   GFRNONAA >60 04/14/2021   GFRAA >60 10/10/2019    Lab Results  Component Value Date   WBC 7.3 04/14/2021   NEUTROABS 4.0 04/14/2021   HGB 11.6 (L) 04/14/2021   HCT 37.0 (L) 04/14/2021   MCV 93.7 04/14/2021   PLT 249 04/14/2021     STUDIES: No results found.  ASSESSMENT: Stage IVB prostate cancer.  PLAN:    1.  Stage IVB prostate cancer: Biopsy and subsequent RFA of the T12 bony lesion confirmed metastatic prostate cancer.  Patient's PSA was initially 213, but now has trended down to 4.13.  Continue with 45 mg Eligard every 6 months.  Given his minimal bony disease, he does not require Zometa at this time.  Return to clinic in 6 months with repeat laboratory work, further evaluation, and continuation of treatment.  2.  Back pain:  Resolved.  RFA as above.   3.  Anemia: Mild.  Patient's most recent hemoglobin is 11.6. Stable.    I spent a total of 20 minutes reviewing chart data, face-to-face evaluation with the patient, counseling and coordination of care as detailed above.  Disposition: Eligard today Rtc in 6 mo- lab, Dr. Grayland Ormond, +/- eligard- la   Patient expressed understanding and was in agreement with this plan. He also understands that He can call clinic at any time with any questions, concerns, or complaints.    Cancer Staging  Prostate cancer [facility]) Staging form: Prostate, AJCC 8th Edition - Clinical stage from 04/11/2020: Stage IVB (cTX, cN0, cM1b, PSA: 213) - Signed by Lloyd Huger, MD on 04/11/2020 Prostate specific antigen (PSA) range: 20 or greater   Akhil Piscopo  Jonathon Resides, NP   04/14/2021

## 2021-04-22 ENCOUNTER — Encounter: Payer: Self-pay | Admitting: Oncology

## 2021-07-03 ENCOUNTER — Ambulatory Visit (INDEPENDENT_AMBULATORY_CARE_PROVIDER_SITE_OTHER): Payer: Medicare Other | Admitting: Podiatry

## 2021-07-03 ENCOUNTER — Encounter: Payer: Self-pay | Admitting: Podiatry

## 2021-07-03 DIAGNOSIS — E1142 Type 2 diabetes mellitus with diabetic polyneuropathy: Secondary | ICD-10-CM

## 2021-07-03 DIAGNOSIS — Z794 Long term (current) use of insulin: Secondary | ICD-10-CM

## 2021-07-03 DIAGNOSIS — M79675 Pain in left toe(s): Secondary | ICD-10-CM | POA: Diagnosis not present

## 2021-07-03 DIAGNOSIS — M79674 Pain in right toe(s): Secondary | ICD-10-CM | POA: Diagnosis not present

## 2021-07-03 DIAGNOSIS — B351 Tinea unguium: Secondary | ICD-10-CM | POA: Diagnosis not present

## 2021-07-03 NOTE — Progress Notes (Signed)
This patient returns to my office for at risk foot care.  This patient requires this care by a professional since this patient will be at risk due to having diabetes.   This patient is unable to cut nails himself since the patient cannot reach his nails.These nails are painful walking and wearing shoes.  This patient presents for at risk foot care today.  General Appearance  Alert, conversant and in no acute stress.  Vascular  Dorsalis pedis and posterior tibial  pulses are palpable  bilaterally.  Capillary return is within normal limits  bilaterally. Temperature is within normal limits  bilaterally.  Neurologic  Senn-Weinstein monofilament wire test within normal limits  bilaterally. Muscle power within normal limits bilaterally.  Nails Thick disfigured discolored nails with subungual debris  from hallux to fifth toes bilaterally. No evidence of bacterial infection or drainage bilaterally.  Orthopedic  No limitations of motion  feet .  No crepitus or effusions noted.  HAV  B/L.  Hammer toes  B/L.  Skin  normotropic skin with no porokeratosis noted bilaterally.  No signs of infections or ulcers noted.     Onychomycosis  Pain in right toes     Consent was obtained for treatment procedures.   Mechanical debridement of nails 1-5  bilaterally performed with a nail nipper.  Filed with dremel without incident.    Return office visit  3 months                   Told patient to return for periodic foot care and evaluation due to potential at risk complications.  Gardiner Barefoot DPM

## 2021-07-19 ENCOUNTER — Emergency Department: Payer: Medicare Other

## 2021-07-19 ENCOUNTER — Other Ambulatory Visit: Payer: Self-pay

## 2021-07-19 ENCOUNTER — Observation Stay: Payer: Medicare Other

## 2021-07-19 ENCOUNTER — Inpatient Hospital Stay
Admission: EM | Admit: 2021-07-19 | Discharge: 2021-07-24 | DRG: 054 | Disposition: A | Discharge status: Home Health | Payer: Medicare Other | Attending: Osteopathic Medicine | Admitting: Osteopathic Medicine

## 2021-07-19 DIAGNOSIS — R197 Diarrhea, unspecified: Secondary | ICD-10-CM | POA: Diagnosis present

## 2021-07-19 DIAGNOSIS — E785 Hyperlipidemia, unspecified: Secondary | ICD-10-CM | POA: Diagnosis present

## 2021-07-19 DIAGNOSIS — C3412 Malignant neoplasm of upper lobe, left bronchus or lung: Secondary | ICD-10-CM | POA: Diagnosis present

## 2021-07-19 DIAGNOSIS — E119 Type 2 diabetes mellitus without complications: Secondary | ICD-10-CM | POA: Diagnosis present

## 2021-07-19 DIAGNOSIS — R0600 Dyspnea, unspecified: Secondary | ICD-10-CM

## 2021-07-19 DIAGNOSIS — Z833 Family history of diabetes mellitus: Secondary | ICD-10-CM

## 2021-07-19 DIAGNOSIS — W19XXXA Unspecified fall, initial encounter: Principal | ICD-10-CM

## 2021-07-19 DIAGNOSIS — E871 Hypo-osmolality and hyponatremia: Secondary | ICD-10-CM | POA: Diagnosis present

## 2021-07-19 DIAGNOSIS — Z87891 Personal history of nicotine dependence: Secondary | ICD-10-CM

## 2021-07-19 DIAGNOSIS — C787 Secondary malignant neoplasm of liver and intrahepatic bile duct: Secondary | ICD-10-CM | POA: Diagnosis present

## 2021-07-19 DIAGNOSIS — C7951 Secondary malignant neoplasm of bone: Secondary | ICD-10-CM | POA: Diagnosis present

## 2021-07-19 DIAGNOSIS — E669 Obesity, unspecified: Secondary | ICD-10-CM | POA: Diagnosis present

## 2021-07-19 DIAGNOSIS — G936 Cerebral edema: Secondary | ICD-10-CM | POA: Diagnosis present

## 2021-07-19 DIAGNOSIS — C7931 Secondary malignant neoplasm of brain: Secondary | ICD-10-CM | POA: Diagnosis not present

## 2021-07-19 DIAGNOSIS — J432 Centrilobular emphysema: Secondary | ICD-10-CM | POA: Diagnosis present

## 2021-07-19 DIAGNOSIS — I272 Pulmonary hypertension, unspecified: Secondary | ICD-10-CM | POA: Diagnosis present

## 2021-07-19 DIAGNOSIS — I679 Cerebrovascular disease, unspecified: Secondary | ICD-10-CM

## 2021-07-19 DIAGNOSIS — Z8249 Family history of ischemic heart disease and other diseases of the circulatory system: Secondary | ICD-10-CM

## 2021-07-19 DIAGNOSIS — Z7984 Long term (current) use of oral hypoglycemic drugs: Secondary | ICD-10-CM

## 2021-07-19 DIAGNOSIS — R296 Repeated falls: Secondary | ICD-10-CM | POA: Diagnosis present

## 2021-07-19 DIAGNOSIS — D509 Iron deficiency anemia, unspecified: Secondary | ICD-10-CM | POA: Diagnosis present

## 2021-07-19 DIAGNOSIS — R001 Bradycardia, unspecified: Secondary | ICD-10-CM | POA: Diagnosis present

## 2021-07-19 DIAGNOSIS — C61 Malignant neoplasm of prostate: Secondary | ICD-10-CM | POA: Diagnosis present

## 2021-07-19 DIAGNOSIS — I10 Essential (primary) hypertension: Secondary | ICD-10-CM | POA: Diagnosis not present

## 2021-07-19 DIAGNOSIS — Y92009 Unspecified place in unspecified non-institutional (private) residence as the place of occurrence of the external cause: Secondary | ICD-10-CM

## 2021-07-19 DIAGNOSIS — D638 Anemia in other chronic diseases classified elsewhere: Secondary | ICD-10-CM | POA: Diagnosis present

## 2021-07-19 DIAGNOSIS — H409 Unspecified glaucoma: Secondary | ICD-10-CM | POA: Diagnosis present

## 2021-07-19 DIAGNOSIS — Z6835 Body mass index (BMI) 35.0-35.9, adult: Secondary | ICD-10-CM

## 2021-07-19 DIAGNOSIS — Z79899 Other long term (current) drug therapy: Secondary | ICD-10-CM

## 2021-07-19 DIAGNOSIS — C3492 Malignant neoplasm of unspecified part of left bronchus or lung: Secondary | ICD-10-CM

## 2021-07-19 DIAGNOSIS — G4733 Obstructive sleep apnea (adult) (pediatric): Secondary | ICD-10-CM | POA: Diagnosis present

## 2021-07-19 LAB — COMPREHENSIVE METABOLIC PANEL
ALT: 15 U/L (ref 0–44)
AST: 27 U/L (ref 15–41)
Albumin: 3.2 g/dL — ABNORMAL LOW (ref 3.5–5.0)
Alkaline Phosphatase: 29 U/L — ABNORMAL LOW (ref 38–126)
Anion gap: 9 (ref 5–15)
BUN: 26 mg/dL — ABNORMAL HIGH (ref 8–23)
CO2: 23 mmol/L (ref 22–32)
Calcium: 8.9 mg/dL (ref 8.9–10.3)
Chloride: 102 mmol/L (ref 98–111)
Creatinine, Ser: 1.2 mg/dL (ref 0.61–1.24)
GFR, Estimated: >60 mL/min (ref >60)
Glucose, Bld: 114 mg/dL — ABNORMAL HIGH (ref 70–99)
Potassium: 4.6 mmol/L (ref 3.5–5.1)
Sodium: 134 mmol/L — ABNORMAL LOW (ref 135–145)
Total Bilirubin: 0.7 mg/dL (ref 0.3–1.2)
Total Protein: 7.9 g/dL (ref 6.5–8.1)

## 2021-07-19 LAB — CK: Total CK: 52 U/L (ref 49–397)

## 2021-07-19 LAB — CBC WITH DIFFERENTIAL/PLATELET
Abs Immature Granulocytes: 0.03 10*3/uL (ref 0.00–0.07)
Basophils Absolute: 0 10*3/uL (ref 0.0–0.1)
Basophils Relative: 0 %
Eosinophils Absolute: 0 10*3/uL (ref 0.0–0.5)
Eosinophils Relative: 1 %
HCT: 29.1 % — ABNORMAL LOW (ref 39.0–52.0)
Hemoglobin: 9.1 g/dL — ABNORMAL LOW (ref 13.0–17.0)
Immature Granulocytes: 0 %
Lymphocytes Relative: 26 %
Lymphs Abs: 2.2 10*3/uL (ref 0.7–4.0)
MCH: 27.7 pg (ref 26.0–34.0)
MCHC: 31.3 g/dL (ref 30.0–36.0)
MCV: 88.7 fL (ref 80.0–100.0)
Monocytes Absolute: 0.9 10*3/uL (ref 0.1–1.0)
Monocytes Relative: 10 %
Neutro Abs: 5.3 10*3/uL (ref 1.7–7.7)
Neutrophils Relative %: 63 %
Platelets: 315 10*3/uL (ref 150–400)
RBC: 3.28 MIL/uL — ABNORMAL LOW (ref 4.22–5.81)
RDW: 15 % (ref 11.5–15.5)
WBC: 8.5 10*3/uL (ref 4.0–10.5)
nRBC: 0 % (ref 0.0–0.2)

## 2021-07-19 LAB — TROPONIN I (HIGH SENSITIVITY)
Troponin I (High Sensitivity): 7 ng/L (ref <18)
Troponin I (High Sensitivity): 11 ng/L (ref <18)

## 2021-07-19 LAB — IRON AND TIBC
Iron: 16 ug/dL — ABNORMAL LOW (ref 45–182)
Saturation Ratios: 7 % — ABNORMAL LOW (ref 17.9–39.5)
TIBC: 234 ug/dL — ABNORMAL LOW (ref 250–450)
UIBC: 218 ug/dL

## 2021-07-19 LAB — HEMOGLOBIN A1C
Hgb A1c MFr Bld: 5.9 % — ABNORMAL HIGH (ref 4.8–5.6)
Mean Plasma Glucose: 122.63 mg/dL

## 2021-07-19 LAB — CBC
HCT: 30.5 % — ABNORMAL LOW (ref 39.0–52.0)
Hemoglobin: 9.7 g/dL — ABNORMAL LOW (ref 13.0–17.0)
MCH: 27.4 pg (ref 26.0–34.0)
MCHC: 31.8 g/dL (ref 30.0–36.0)
MCV: 86.2 fL (ref 80.0–100.0)
Platelets: 310 10*3/uL (ref 150–400)
RBC: 3.54 MIL/uL — ABNORMAL LOW (ref 4.22–5.81)
RDW: 15 % (ref 11.5–15.5)
WBC: 7.1 10*3/uL (ref 4.0–10.5)
nRBC: 0 % (ref 0.0–0.2)

## 2021-07-19 LAB — LACTIC ACID, PLASMA
Lactic Acid, Venous: 2.4 mmol/L (ref 0.5–1.9)
Lactic Acid, Venous: 1.5 mmol/L (ref 0.5–1.9)

## 2021-07-19 LAB — FERRITIN: Ferritin: 715 ng/mL — ABNORMAL HIGH (ref 24–336)

## 2021-07-19 LAB — GLUCOSE, CAPILLARY: Glucose-Capillary: 127 mg/dL — ABNORMAL HIGH (ref 70–99)

## 2021-07-19 MED ORDER — AMLODIPINE BESYLATE 5 MG PO TABS
5.0000 mg | ORAL_TABLET | Freq: Every day | ORAL | Status: DC
  Administered 2021-07-19 – 2021-07-24 (×6): 5 mg via ORAL
  Filled 2021-07-19 (×6): qty 1

## 2021-07-19 MED ORDER — ACETAMINOPHEN 325 MG PO TABS
650.0000 mg | ORAL_TABLET | Freq: Four times a day (QID) | ORAL | Status: DC | PRN
  Administered 2021-07-20 – 2021-07-22 (×4): 650 mg via ORAL
  Filled 2021-07-19 (×5): qty 2

## 2021-07-19 MED ORDER — INSULIN ASPART 100 UNIT/ML IJ SOLN
0.0000 [IU] | Freq: Three times a day (TID) | INTRAMUSCULAR | Status: DC
  Administered 2021-07-20 (×2): 2 [IU] via SUBCUTANEOUS
  Administered 2021-07-20 – 2021-07-22 (×5): 1 [IU] via SUBCUTANEOUS
  Administered 2021-07-22 – 2021-07-23 (×3): 2 [IU] via SUBCUTANEOUS
  Administered 2021-07-23: 1 [IU] via SUBCUTANEOUS
  Administered 2021-07-23: 2 [IU] via SUBCUTANEOUS
  Filled 2021-07-19 (×12): qty 1

## 2021-07-19 MED ORDER — TRAZODONE HCL 50 MG PO TABS
50.0000 mg | ORAL_TABLET | Freq: Every day | ORAL | Status: DC
  Administered 2021-07-19 – 2021-07-23 (×5): 50 mg via ORAL
  Filled 2021-07-19 (×5): qty 1

## 2021-07-19 MED ORDER — GADOBUTROL 1 MMOL/ML IV SOLN
10.0000 mL | Freq: Once | INTRAVENOUS | Status: AC | PRN
  Administered 2021-07-19: 10 mL via INTRAVENOUS

## 2021-07-19 MED ORDER — DEXAMETHASONE SODIUM PHOSPHATE 4 MG/ML IJ SOLN
4.0000 mg | Freq: Four times a day (QID) | INTRAMUSCULAR | Status: DC
  Administered 2021-07-20 (×3): 4 mg via INTRAVENOUS
  Filled 2021-07-19 (×2): qty 1

## 2021-07-19 MED ORDER — LATANOPROST 0.005 % OP SOLN
1.0000 [drp] | Freq: Every day | OPHTHALMIC | Status: DC
Start: 2021-07-19 — End: 2021-07-24
  Administered 2021-07-20 – 2021-07-23 (×4): 1 [drp] via OPHTHALMIC
  Filled 2021-07-19 (×2): qty 2.5

## 2021-07-19 MED ORDER — SIMVASTATIN 20 MG PO TABS
20.0000 mg | ORAL_TABLET | Freq: Every day | ORAL | Status: DC
  Administered 2021-07-19 – 2021-07-23 (×5): 20 mg via ORAL
  Filled 2021-07-19 (×5): qty 1

## 2021-07-19 MED ORDER — ENOXAPARIN SODIUM 40 MG/0.4ML IJ SOSY: 40.0000 mg | PREFILLED_SYRINGE | INTRAMUSCULAR | Status: DC

## 2021-07-19 MED ORDER — SODIUM CHLORIDE 0.9 % IV BOLUS
1000.0000 mL | Freq: Once | INTRAVENOUS | Status: AC
  Administered 2021-07-19: 1000 mL via INTRAVENOUS

## 2021-07-19 MED ORDER — IOHEXOL 300 MG/ML  SOLN
100.0000 mL | Freq: Once | INTRAMUSCULAR | Status: AC | PRN
  Administered 2021-07-19: 100 mL via INTRAVENOUS

## 2021-07-19 MED ORDER — TIMOLOL MALEATE 0.25 % OP SOLN
1.0000 [drp] | Freq: Two times a day (BID) | OPHTHALMIC | Status: DC
  Administered 2021-07-20 – 2021-07-23 (×7): 1 [drp] via OPHTHALMIC
  Filled 2021-07-19: qty 5

## 2021-07-19 MED ORDER — DEXAMETHASONE SODIUM PHOSPHATE 10 MG/ML IJ SOLN
10.0000 mg | Freq: Once | INTRAMUSCULAR | Status: AC
  Administered 2021-07-19: 10 mg via INTRAVENOUS
  Filled 2021-07-19: qty 1

## 2021-07-19 NOTE — ED Provider Notes (Signed)
[facility] Provider Note    Event Date/Time   First MD Initiated Contact with Patient 07/19/21 1614     (approximate)   History   Fall   HPI  Evan Gonzalez is a 81 y.o. male  who, per oncology note dated 04/14/21 has history of DM, HLD, HTN, prostate cancer, who presents to the emergency department today after suffering a fall. The patient says he was getting out of his car when his foot got caught up in the gravel and he fell.  He denies hitting his head.  Denies any injuries from the fall.  However he states this is now his second fall today.  He says that he had fallen earlier in the day when he was getting gas and bystanders helped him get back into his car.  Normally he is not able to get himself up off of the ground.  He denies any associated chest pain or palpitations.  Physical Exam   Triage Vital Signs: ED Triage Vitals  Enc Vitals Group     BP --      Pulse Rate 07/19/21 1623 (!) 101     Resp 07/19/21 1623 14     Temp 07/19/21 1623 99.1 F (37.3 C)     Temp Source 07/19/21 1623 Oral     SpO2 07/19/21 1623 97 %     Weight --      Height 07/19/21 1612 5\' 11"  (1.803 m)     Head Circumference --      Peak Flow --      Pain Score 07/19/21 1612 0   Most recent vital signs: Vitals:   07/19/21 1623  Pulse: (!) 101  Resp: 14  Temp: 99.1 F (37.3 C)  SpO2: 97%    General: Awake, alert, oriented. CV:  Good peripheral perfusion.  Resp:  Normal effort.  Abd:  No distention.     ED Results / Procedures / Treatments   Labs (all labs ordered are listed, but only abnormal results are displayed) Labs Reviewed  LACTIC ACID, PLASMA - Abnormal; Notable for the following components:      Result Value   Lactic Acid, Venous 2.4 (*)    All other components within normal limits  COMPREHENSIVE METABOLIC PANEL - Abnormal; Notable for the following components:   Sodium 134 (*)    Glucose, Bld 114 (*)    BUN 26 (*)    Albumin 3.2 (*)     Alkaline Phosphatase 29 (*)    All other components within normal limits  CBC WITH DIFFERENTIAL/PLATELET - Abnormal; Notable for the following components:   RBC 3.28 (*)    Hemoglobin 9.1 (*)    HCT 29.1 (*)    All other components within normal limits  CULTURE, BLOOD (ROUTINE X 2)  CULTURE, BLOOD (ROUTINE X 2)  CK  LACTIC ACID, PLASMA  URINALYSIS, COMPLETE (UACMP) WITH MICROSCOPIC  PSA  CBC  IRON AND TIBC  FERRITIN  VITAMIN B12  HEMOGLOBIN A1C  TROPONIN I (HIGH SENSITIVITY)  TROPONIN I (HIGH SENSITIVITY)     EKG  I, Nance Pear, attending physician, personally viewed and interpreted this EKG  EKG Time: 1623 Rate: 103 Rhythm: sinus tachycardia Axis: left axis deviation Intervals: qtc 425 QRS: LAFB ST changes: no st elevation Impression: abnormal ekg  RADIOLOGY I independently interpreted and visualized the CXR. My interpretation: No pneumonia, no pneumothorax Radiology interpretation:  IMPRESSION:  Rounded masslike opacity in left mid lung, which could be due to  carcinoma or pulmonary contusion. Chest CT with contrast is  recommended for further evaluation.       I independently interpreted and visualized the ct brain. My interpretation: No bleed. Radiology interpretation:  IMPRESSION:  1. There is a 1.2 x 1.3 cm mass in the cortex of the right frontal  lobe medially with adjacent vasogenic edema. The finding is  concerning for a metastasis. Recommend an MRI.  2. A region of low attenuation in the left occipital lobe could  represent a small stroke, new since February 2021 or a second site  of vasogenic edema. Recommend attention to this region on follow-up.  3. No midline shift.  4. Chronic white matter changes.    PROCEDURES:  Critical Care performed: No  Procedures   MEDICATIONS ORDERED IN ED: Medications - No data to display   IMPRESSION / MDM / [place] / ED COURSE  I reviewed the triage vital signs and the nursing notes.                               Differential diagnosis includes, but is not limited to, intracranial bleed, anemia, electrolyte abnormality, ACS.  Patient's presentation is most consistent with acute presentation with potential threat to life or bodily function.  Patient presented to the emergency department today after being found down.  Patient states this was a second fall of the day.  Patient is on blood thinners.  He thinks that he tripped on some gravel.  Given unclear etiology of the fall broad work-up was initiated.  Additionally did check head CT.  My initial concern was for a bleed however did show possible metastatic cancer with vasogenic edema.  Additionally chest x-ray was concerning for possible mass.  I discussed these findings with the patient.  Did write steroid for patient given edema.  Discussed findings with patient and family. Discussed with Dr. Roosevelt Locks with the hospitalist service who will plan on admission.  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    Nance Pear, MD 07/19/21 [phone]

## 2021-07-19 NOTE — ED Notes (Signed)
Pt transported to CT ?

## 2021-07-19 NOTE — ED Notes (Signed)
Pt transported to MRI 

## 2021-07-19 NOTE — H&P (Addendum)
History and Physical    Patient: Evan Gonzalez:[id number] DOB: 1940-07-10 DOA: 07/19/2021 DOS: the patient was seen and examined on 07/19/2021 PCP: Pcp, No  Patient coming from: Patient came from home, currently with some.  He walks with a walker.  Chief Complaint:  Chief Complaint  Patient presents with   Fall   HPI: Evan Gonzalez is a 81 y.o. male with medical history significant of essential hypertension, type 2 diabetes, prostate cancer with bone mets who presents to the hospital with a fall.  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 He was given Decadron after speak with Dr. Janese Banks from oncology.  Patient is admitted to the hospital for further work-up as well as observation. Patient also has a large loose stool today before the fall.  But patient denies ever blacking out.  He had a significant weakness after diarrhea.  He also has lost 20 pounds of body weight in the last week. Review of Systems: As mentioned in the history of present illness. All other systems reviewed and are negative. Past Medical History:  Diagnosis Date   Diabetes mellitus without complication ([place])    Glaucoma    Hyperlipidemia    Hypertension    Past Surgical History:  Procedure Laterality Date   EYE SURGERY     IR BONE TUMOR(S)RF ABLATION  03/28/2019   IR KYPHO THORACIC WITH BONE BIOPSY  03/28/2019   IR RADIOLOGIST EVAL & MGMT  03/21/2019   IR RADIOLOGIST EVAL & MGMT  04/11/2019   Social History:  reports that he quit smoking about 34 years ago. His smoking use included cigarettes. He has never used smokeless tobacco. He reports that he does not drink alcohol and does not use drugs.  No Known Allergies  Family History  Problem Relation Age of Onset   Heart attack Mother    Diabetes Father     Prior to Admission medications   Medication Sig Start Date End Date Taking? Authorizing Provider  acetaminophen (TYLENOL) 650 MG CR tablet Take 650 mg by mouth 2 (two) times daily.     [provider]  amLODipine (NORVASC) 5 MG tablet  01/23/20   [provider]  cetirizine (ZYRTEC) 10 MG tablet Take 10 mg by mouth daily.    [provider]  dipyridamole-aspirin (AGGRENOX) 200-25 MG 12hr capsule Take 1 capsule by mouth 2 (two) times daily.    [provider]  dorzolamide (TRUSOPT) 2 % ophthalmic solution Place 1 drop into both eyes 2 (two) times daily.    [provider]  fluticasone (FLONASE) 50 MCG/ACT nasal spray Place 2 sprays into both nostrils daily. 02/07/17   Melynda Ripple, MD  latanoprost (XALATAN) 0.005 % ophthalmic solution Place 1 drop into both eyes at bedtime.    [provider]  lisinopril-hydrochlorothiazide (ZESTORETIC) 10-12.5 MG tablet  01/23/20   [provider]  meclizine (ANTIVERT) 25 MG tablet Take 1 tablet (25 mg total) by mouth 3 (three) times daily as needed for dizziness or nausea. 04/03/19   Carrie Mew, MD  metFORMIN (GLUCOPHAGE) 500 MG tablet Take 500 mg by mouth 2 (two) times daily with a meal.    [provider]  NONFORMULARY OR COMPOUNDED ITEM See pharmacy note 11/02/19   Gardiner Barefoot, DPM  ondansetron (ZOFRAN ODT) 4 MG disintegrating tablet Take 1 tablet (4 mg total) by mouth every 8 (eight) hours as needed for nausea or vomiting. 04/03/19   Carrie Mew, MD  simvastatin (ZOCOR) 20 MG tablet Take  20 mg by mouth at bedtime.    [provider]  timolol (TIMOPTIC) 0.25 % ophthalmic solution  07/28/19   [provider]  traZODone (DESYREL) 50 MG tablet Take 50 mg by mouth at bedtime. 12/10/20   [provider]    Physical Exam: Vitals:   07/19/21 1623 07/19/21 1625 07/19/21 1630 07/19/21 1700  BP:  93/61 (!) 96/52 100/60  Pulse: (!) 101 (!) 104 (!) 106 98  Resp: 14 13 19 17   Temp: 99.1 F (37.3 C)     TempSrc: Oral     SpO2: 97% 95% 94% 95%  Height:       Physical Activity: Not on file   Physical Exam Constitutional:       General: He is not in acute distress.    Appearance: He is ill-appearing. He is not toxic-appearing.  HENT:     Head: Normocephalic and atraumatic.     Nose: Nose normal.     Mouth/Throat:     Mouth: Mucous membranes are moist.     Pharynx: No oropharyngeal exudate.  Eyes:     Extraocular Movements: Extraocular movements intact.     Conjunctiva/sclera: Conjunctivae normal.     Pupils: Pupils are equal, round, and reactive to light.  Cardiovascular:     Rate and Rhythm: Normal rate and regular rhythm.     Heart sounds: No murmur heard. Pulmonary:     Effort: Pulmonary effort is normal. No respiratory distress.     Breath sounds: Normal breath sounds. No wheezing.  Abdominal:     General: Abdomen is flat. Bowel sounds are normal. There is no distension.     Palpations: Abdomen is soft.     Tenderness: There is no abdominal tenderness.  Musculoskeletal:        General: No swelling or tenderness. Normal range of motion.     Cervical back: Normal range of motion and neck supple. No rigidity or tenderness.  Skin:    General: Skin is warm and dry.     Coloration: Skin is not jaundiced.  Neurological:     General: No focal deficit present.     Mental Status: He is alert and oriented to person, place, and time.     Cranial Nerves: No cranial nerve deficit.  Psychiatric:        Mood and Affect: Mood normal.        Thought Content: Thought content normal.    Data Reviewed:  Reviewed the CT scan results, all lab results.  Assessment and Plan: Prostate cancer with bone mets. 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 Fall at home due to weakness. Patient recently has lost significant weight, he became weaker.  He had loose stool today, felt increased weakness and a fall.  Did not see any focal neurological changes.  No syncope. Brain CT scan showed metastatic lesion with edema, given history of prostate cancer, this most likely is metastasis from prostate  cancer. Emergency room has discussed with Dr. Janese Banks from oncology, she will see the patient.  Also patient is to get MRI of the brain, CT abdomen/pelvis/chest. Continue Decadron 5 mg every 6 hours for now. I will also obtain PT/OT.  Patient and his son refused to ever going to a nursing home.  Most likely he will be going home tomorrow after work-up complete and initial treatment.  Hyponatremia. Pretty mild secondary to diarrhea today.  Essential hypertension. Hold off lisinopril HCTZ, continue amlodipine for now.  Type 2 diabetes.  Continue some sliding scale insulin.  Anemia. Check iron B12 level.    Advance Care Planning:   Code Status: Full Code Full  Consults: Oncology  Family Communication: Son at bedside.  Severity of Illness: The appropriate patient status for this patient is OBSERVATION. Observation status is judged to be reasonable and necessary in order to provide the required intensity of service to ensure the patient's safety. The patient's presenting symptoms, physical exam findings, and initial radiographic and laboratory data in the context of their medical condition is felt to place them at decreased risk for further clinical deterioration. Furthermore, it is anticipated that the patient will be medically stable for discharge from the hospital within 2 midnights of admission.   Author: Sharen Hones, MD 07/19/2021 6:00 PM  For on call review [email or web address].

## 2021-07-19 NOTE — Consult Note (Signed)
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 Archie Balboa  Date of visit: 07/19/2021    History of presenting illness-patient is a 81 year old male with history of castrate sensitive metastatic prostate cancer diagnosed in 2021.  He sees Dr. Grayland Ormond as an outpatient.  He had biopsy-proven prostate cancer based on his bone biopsyIn February 2021 from the T12 area.  Scans at that time showed involvement of the T12 vertebral spine as well as left external iliac lymph node involvement.  PSA was elevated at that time at 213.  He was started on androgen deprivation therapy with Lupron and his last dose was given in February 2023 and gets it every 6 months.  Last outpatient PSA in February 2023 was overall stable at 4.13.  Patient now presents with history of frequent falls and had a CT head without contrast which showed 1.2 x 1.3 cm mass in the cortex of the right frontal lobe with adjacent vasogenic edema.  This was followed by an MRI brain with and without contrast which shows at least 4 different sites of metastases with the largest one being in the right frontal lobe with regional edema.  I subsequently recommended that the patient should also get systemic scans and CT chest abdomen and pelvis with contrast shows a 5.9 x 4.2 x 6 cm heterogeneous mass in the anteromedial aspect of the left upper lobe consistent with primary lung malignancy.  Multiple low-attenuation liver lesions concerning for metastatic disease.  Low-attenuation left adrenal mass which may represent adrenal adenoma but metastases not ruled  out.  At baseline patient lives with his son but his son is out of the house for the most part because of his job and patient is mostly alone.  He does report ongoing fatigue, unintentional weight loss as well as frequent falls.  He denies any pain at this time  ECOG PS- 2  Pain scale- 0   Review of systems- Review of Systems  Constitutional:  Positive for malaise/fatigue and weight loss. Negative for chills and fever.  HENT:  Negative for congestion, ear discharge and nosebleeds.   Eyes:  Negative for blurred vision.  Respiratory:  Negative for cough, hemoptysis, sputum production, shortness of breath and wheezing.   Cardiovascular:  Negative for chest pain, palpitations, orthopnea and claudication.  Gastrointestinal:  Negative for abdominal pain, blood in stool, constipation, diarrhea, heartburn, melena, nausea and vomiting.  Genitourinary:  Negative for dysuria, flank pain, frequency, hematuria and urgency.  Musculoskeletal:  Negative for back pain, joint pain and myalgias.  Skin:  Negative for rash.  Neurological:  Negative for dizziness, tingling, focal weakness, seizures, weakness and headaches.  Endo/Heme/Allergies:  Does not bruise/bleed easily.  Psychiatric/Behavioral:  Negative for depression and suicidal ideas. The patient does not have insomnia.    No Known Allergies  Patient Active Problem List   Diagnosis Date Noted   Metastatic cancer to brain [facility]) 07/19/2021   Hyponatremia 07/19/2021   Genetic testing 04/24/2020   Prostate cancer metastatic to bone ([place]) 03/11/2020   Prostate cancer ([place]) 03/11/2019   Lymphadenopathy 02/21/2019   Bone lesion 02/21/2019   Acute right flank pain 01/30/2019   Vertigo 03/23/2018  Acute right-sided low back pain with right-sided sciatica 10/06/2017   Impacted cerumen of left ear 10/06/2017   Right rotator cuff tear arthropathy 02/10/2017   Aortic atherosclerosis ([place]) 10/22/2015   Benign paroxysmal positional vertigo due to  bilateral vestibular disorder 09/18/2015   Cerebral aneurysm without rupture 09/18/2015   Essential hypertension 09/18/2015   Risk for falls 08/29/2015   Pulmonary hypertension ([place]) 12/07/2012   Dyspnea 10/05/2012   Headache 10/05/2012   Leg swelling 10/05/2012   Cerebrovascular disease 07/21/2012   Constipation 07/21/2012   Diabetes ([place]) 07/21/2012   OSA on CPAP 07/21/2012   Osteoarthritis of both hips 07/21/2012   Complete rupture of rotator cuff 04/08/2012     Past Medical History:  Diagnosis Date   Diabetes mellitus without complication ([place])    Glaucoma    Hyperlipidemia    Hypertension      Past Surgical History:  Procedure Laterality Date   EYE SURGERY     IR BONE TUMOR(S)RF ABLATION  03/28/2019   IR KYPHO THORACIC WITH BONE BIOPSY  03/28/2019   IR RADIOLOGIST EVAL & MGMT  03/21/2019   IR RADIOLOGIST EVAL & MGMT  04/11/2019    Social History   Socioeconomic History   Marital status: Single    Spouse name: Not on file   Number of children: Not on file   Years of education: Not on file   Highest education level: Not on file  Occupational History   Not on file  Tobacco Use   Smoking status: Former    Types: Cigarettes    Quit date: 02/08/1987    Years since quitting: 34.4   Smokeless tobacco: Never  Vaping Use   Vaping Use: Never used  Substance and Sexual Activity   Alcohol use: No   Drug use: No   Sexual activity: Not on file  Other Topics Concern   Not on file  Social History Narrative   Not on file   Social Determinants of Health   Financial Resource Strain: Not on file  Food Insecurity: Not on file  Transportation Needs: Not on file  Physical Activity: Not on file  Stress: Not on file  Social Connections: Not on file  Intimate Partner Violence: Not on file     Family History  Problem Relation Age of Onset   Heart attack Mother    Diabetes Father      Current Facility-Administered Medications:    amLODipine (NORVASC) tablet 5 mg, 5  mg, Oral, Daily, Sharen Hones, MD, 5 mg at 07/19/21 1922   dexamethasone (DECADRON) injection 4 mg, 4 mg, Intravenous, Q6H, Sharen Hones, MD   enoxaparin (LOVENOX) injection 40 mg, 40 mg, Subcutaneous, Q24H, Sharen Hones, MD   [START ON 07/20/2021] insulin aspart (novoLOG) injection 0-9 Units, 0-9 Units, Subcutaneous, TID WC, Zhang, Dekui, MD   latanoprost (XALATAN) 0.005 % ophthalmic solution 1 drop, 1 drop, Both Eyes, QHS, Zhang, Dekui, MD   simvastatin (ZOCOR) tablet 20 mg, 20 mg, Oral, QHS, Zhang, Dekui, MD, 20 mg at 07/19/21 2207   timolol (TIMOPTIC) 0.25 % ophthalmic solution 1 drop, 1 drop, Both Eyes, BID, Sharen Hones, MD   traZODone (DESYREL) tablet 50 mg, 50 mg, Oral, QHS, Sharen Hones, MD, 50 mg at 07/19/21 2207   Physical exam:  Vitals:   07/19/21 1918 07/19/21 2008 07/19/21 2012 07/19/21 2113  BP: 117/63 104/63 (P) 104/63 104/63  Pulse: 98 (!) 105 (!) (P) 105 (!) 105  Resp: 17 18 (P) 18 18  Temp: 98.7 F (37.1  C) 98.4 F (36.9 C) (P) 98.4 F (36.9 C) 98.4 F (36.9 C)  TempSrc: Oral Oral (P) Oral Oral  SpO2: 94% 93%    Weight:    257 lb 15 oz (117 kg)  Height:    5\' 11"  (1.803 m)   Physical Exam Constitutional:      General: He is not in acute distress. Cardiovascular:     Rate and Rhythm: Normal rate and regular rhythm.     Heart sounds: Normal heart sounds.  Pulmonary:     Effort: Pulmonary effort is normal.     Breath sounds: Normal breath sounds.  Abdominal:     General: Bowel sounds are normal.     Palpations: Abdomen is soft.  Skin:    General: Skin is warm and dry.  Neurological:     Mental Status: He is alert and oriented to person, place, and time.          Latest Ref Rng & Units 07/19/2021    4:15 PM  CMP  Glucose 70 - 99 mg/dL 114    BUN 8 - 23 mg/dL 26    Creatinine 0.61 - 1.24 mg/dL 1.20    Sodium 135 - 145 mmol/L 134    Potassium 3.5 - 5.1 mmol/L 4.6    Chloride 98 - 111 mmol/L 102    CO2 22 - 32 mmol/L 23    Calcium 8.9 - 10.3 mg/dL 8.9     Total Protein 6.5 - 8.1 g/dL 7.9    Total Bilirubin 0.3 - 1.2 mg/dL 0.7    Alkaline Phos 38 - 126 U/L 29    AST 15 - 41 U/L 27    ALT 0 - 44 U/L 15        Latest Ref Rng & Units 07/19/2021    8:16 PM  CBC  WBC 4.0 - 10.5 K/uL 7.1    Hemoglobin 13.0 - 17.0 g/dL 9.7    Hematocrit 39.0 - 52.0 % 30.5    Platelets 150 - 400 K/uL 310      @IMAGES @  CT Head Wo Contrast  Result Date: 07/19/2021 CLINICAL DATA:  Syncope EXAM: CT HEAD WITHOUT CONTRAST TECHNIQUE: Contiguous axial images were obtained from the base of the skull through the vertex without intravenous contrast. RADIATION DOSE REDUCTION: This exam was performed according to the departmental dose-optimization program which includes automated exposure control, adjustment of the mA and/or kV according to patient size and/or use of iterative reconstruction technique. COMPARISON:  September 24, 2017 CT scan FINDINGS: Brain: No subdural, epidural, or subarachnoid hemorrhage. There is a mass in the right frontal lobe as identified on axial image 20 and sagittal image 30 measuring 1.2 x 1.3 cm. There is adjacent vasogenic edema. There is low attenuation in the left occipital lobe on axial image 17 which could represent a small stroke, new since February 2021. The low-attenuation could be another site of vasogenic edema but a definitive mass is not noted. No other masses identified. No midline shift. Ventricles and sulci are unremarkable. Cerebellum, brainstem, and basal cisterns are normal. Chronic white matter changes. No acute cortical ischemia or infarct. Vascular: Calcified atherosclerotic change in the intracranial carotids. Skull: Normal. Negative for fracture or focal lesion. Sinuses/Orbits: No acute finding. Other: None. IMPRESSION: 1. There is a 1.2 x 1.3 cm mass in the cortex of the right frontal lobe medially with adjacent vasogenic edema. The finding is concerning for a metastasis. Recommend an MRI. 2. A region of low attenuation in the  left  occipital lobe could represent a small stroke, new since February 2021 or a second site of vasogenic edema. Recommend attention to this region on follow-up. 3. No midline shift. 4. Chronic white matter changes. Electronically Signed   By: Dorise Bullion III M.D.   On: 07/19/2021 17:03   MR Brain W and Wo Contrast  Result Date: 07/19/2021 CLINICAL DATA:  History of prostate cancer. Acute presentation today after falling. Abnormal head CT. EXAM: MRI HEAD WITHOUT AND WITH CONTRAST TECHNIQUE: Multiplanar, multiecho pulse sequences of the brain and surrounding structures were obtained without and with intravenous contrast. CONTRAST:  67mL GADAVIST GADOBUTROL 1 MMOL/ML IV SOLN COMPARISON:  Head CT earlier same day FINDINGS: Brain: Diffusion imaging does not show any acute or subacute infarction. No abnormality is seen affecting the brainstem or cerebellum. There is cerebral hemispheric atrophy, temporal lobe predominant. There is an 8 mm enhancing mass in the left occipital lobe with surrounding vasogenic edema. There is a 13 mm mass in the right frontal lobe with regional edema. There is a 2 mm enhancing lesion within the cerebellar vermis postcontrast axial image 46. There is a 2 mm enhancing lesion within the left occipital lobe, postcontrast image 62. There is a 3 mm enhancing lesion in the right external capsule region, postcontrast axial image 69. These most likely represent metastatic disease. Susceptibility weighted imaging shows signal loss, suggesting that there is microhemorrhage within these metastases. 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 There are mild chronic small-vessel ischemic changes affecting the white matter. No hydrocephalus. No extra-axial collection. Vascular: Major vessels at the base of the brain show flow. Skull and upper cervical spine: Negative  Sinuses/Orbits: Clear/normal Other: None IMPRESSION: 13 mm micro hemorrhagic metastasis in the right frontal lobe with regional edema. 8 mm micro hemorrhagic metastasis in the left occipital lobe with regional edema. 3 mm micro hemorrhagic metastasis in the external capsule on the right with very minimal associated edema. Punctate/2 mm metastases visible in the cerebellar vermis and left occipital lobe consistent with tiny metastases. Electronically Signed   By: Nelson Chimes M.D.   On: 07/19/2021 19:14   CT CHEST ABDOMEN PELVIS W CONTRAST  Result Date: 07/19/2021 CLINICAL DATA:  Mass in the cortex of the right frontal lobe concerning for metastasis. EXAM: CT CHEST, ABDOMEN, AND PELVIS WITH CONTRAST TECHNIQUE: Multidetector CT imaging of the chest, abdomen and pelvis was performed following the standard protocol during bolus administration of intravenous contrast. RADIATION DOSE REDUCTION: This exam was performed according to the departmental dose-optimization program which includes automated exposure control, adjustment of the mA and/or kV according to patient size and/or use of iterative reconstruction technique. CONTRAST:  114mL OMNIPAQUE IOHEXOL 300 MG/ML  SOLN COMPARISON:  March 10, 2019 FINDINGS: CT CHEST FINDINGS Cardiovascular: There is mild calcification of the aortic arch, without evidence of aortic aneurysm. Evaluation of the segmental and subsegmental pulmonary arteries within the bilateral lower lobes is limited secondary to patient motion. Normal heart size. No pericardial effusion. Mediastinum/Nodes: Lungs/Pleura: There is moderate severity emphysematous lung disease. A 5.9 cm x 4.2 cm x 6.0 cm heterogeneous lung mass is seen within the anteromedial aspect of the left upper lobe. This represents a new finding when compared to the prior study. There is no evidence of acute infiltrate, pleural effusion or pneumothorax. Musculoskeletal: There is evidence of prior vertebroplasty at the level of T12  vertebral body. Multilevel  degenerative changes are seen throughout the thoracic spine. CT ABDOMEN PELVIS FINDINGS Hepatobiliary: A 2.8 cm x 2.9 cm ill-defined low-attenuation liver lesion is seen within the dome of the liver. Additional 0.9 cm and 0.8 cm foci of low attenuation are noted within the right lobe. There is no evidence of gallstones, gallbladder wall thickening or pericholecystic inflammation. No biliary dilatation is identified. Pancreas: Unremarkable. No pancreatic ductal dilatation or surrounding inflammatory changes. Spleen: No splenic injury or perisplenic hematoma. Adrenals/Urinary Tract: A 1.9 cm x 1.6 cm low-attenuation left adrenal mass is seen (approximately 30.08 Hounsfield units). The right adrenal gland is unremarkable. Kidneys are normal in size, without obstructing renal calculi, focal lesion, or hydronephrosis. Bladder is unremarkable. Stomach/Bowel: There is a small hiatal hernia. Appendix appears normal. No evidence of bowel wall thickening, distention, or inflammatory changes. Noninflamed diverticula are seen within the descending and sigmoid colon. Vascular/Lymphatic: Aortic atherosclerosis. No enlarged abdominal or pelvic lymph nodes. Reproductive: The prostate gland is not clearly identified. Other: No abdominal wall hernia or abnormality. No abdominopelvic ascites. Musculoskeletal: Multilevel degenerative changes seen throughout the lumbar spine. IMPRESSION: 1. 5.9 cm x 4.2 cm x 6.0 cm heterogeneous lung mass within the anteromedial aspect of the left upper lobe, consistent with a primary lung malignancy. 2. Moderate severity emphysematous lung disease. 3. Multiple low-attenuation liver lesions, concerning for metastatic disease. Correlation with nonemergent hepatic MRI is recommended. 4. Low-attenuation left adrenal mass which may represent an adrenal adenoma. Sequelae associated with metastatic disease cannot completely be excluded. 5. Colonic diverticulosis. 6. Evidence of  prior vertebroplasty at the level of T12 vertebral body. 7. Small hiatal hernia. Aortic Atherosclerosis (ICD10-I70.0) and Emphysema (ICD10-J43.9). Electronically Signed   By: Virgina Norfolk M.D.   On: 07/19/2021 19:27   DG Chest Port 1 View  Result Date: 07/19/2021 CLINICAL DATA:  Unwitnessed fall.  Weakness.  Hypotension. EXAM: PORTABLE CHEST 1 VIEW COMPARISON:  02/07/2017 FINDINGS: Heart size is within normal limits. A rounded masslike opacity is seen in the left mid lung measuring approximately 5 x 7 cm. This could be due to carcinoma or pulmonary contusion. No evidence of pleural effusion. Right lung is clear. IMPRESSION: Rounded masslike opacity in left mid lung, which could be due to carcinoma or pulmonary contusion. Chest CT with contrast is recommended for further evaluation. Electronically Signed   By: Marlaine Hind M.D.   On: 07/19/2021 16:50    Assessment and plan- Patient is a 81 y.o. male with history of castrate sensitive oligometastatic prostate cancer with bone and lymph node metastases admitted for frequent falls found to have brain metastases:  Brain metastases: Recommend starting the patient on 4 mg IV Decadron twice daily.  On clinical exam patient does not have any focal neurological deficit.  Radiation oncology will need to be consulted on Monday morning for palliative whole brain radiation which can be done as an inpatient or outpatient.  Lung mass: Patient states that he used to smoke 40 years ago but has not smoked since then.  Even prior to that he was not a heavy smoker.  Interestingly patient has a history of oligometastatic castrate sensitive prostate cancer.  He last received ADT in February 2023 and at that time his PSA was stable at 4.  Now he presents with a relatively large left upper lobe lung mass and the clinical picture of the lung mass and brain metastases is more compatible with a primary lung malignancy rather than a prostate cancer.  I would recommend having  pulmonary on board  to consider bronchoscopy for biopsy of the left upper lobe lung mass.  Check PSA.  I am also ordering a bone scan.  Depending on the results of bronchoscopy and PSA results Dr. Grayland Ormond can consider ordering a regular PET scan or a PSMA PET scan as an outpatient.  Normocytic anemia: B12 levels are normal and ferritin levels are elevated with low TIBCMore indicative of anemia of chronic disease.  Since iron saturation is low at 7% it would not be unreasonable to offer 1 dose of IV iron while he is in the hospital.    Visit Diagnosis 1. Fall, initial encounter   2. Metastatic cancer to brain [facility])     Dr. Randa Evens, MD, MPH [facility] [phone] 07/19/2021

## 2021-07-19 NOTE — ED Triage Notes (Addendum)
Pt to ED via [facility] from home for unwitnessed fall. Pt was getting out of car and tripped over feet and fell. Pt was down approximately 1hr before family saw. Pt denies blood thinners, CP, LOC or hitting his head. Pt BP initially 93/60 after 441ml of NS BP 106/70  Pt also states he fell earlier today with no LOC or head trauma.   EMS VS:  CBG 122 HR 70 RR 20 BP 93/60

## 2021-07-19 NOTE — Plan of Care (Signed)

## 2021-07-20 DIAGNOSIS — C3492 Malignant neoplasm of unspecified part of left bronchus or lung: Secondary | ICD-10-CM | POA: Diagnosis not present

## 2021-07-20 DIAGNOSIS — Z8249 Family history of ischemic heart disease and other diseases of the circulatory system: Secondary | ICD-10-CM | POA: Diagnosis not present

## 2021-07-20 DIAGNOSIS — E119 Type 2 diabetes mellitus without complications: Secondary | ICD-10-CM | POA: Diagnosis present

## 2021-07-20 DIAGNOSIS — C7931 Secondary malignant neoplasm of brain: Secondary | ICD-10-CM | POA: Diagnosis present

## 2021-07-20 DIAGNOSIS — Z833 Family history of diabetes mellitus: Secondary | ICD-10-CM | POA: Diagnosis not present

## 2021-07-20 DIAGNOSIS — C61 Malignant neoplasm of prostate: Secondary | ICD-10-CM | POA: Diagnosis present

## 2021-07-20 DIAGNOSIS — C787 Secondary malignant neoplasm of liver and intrahepatic bile duct: Secondary | ICD-10-CM | POA: Diagnosis present

## 2021-07-20 DIAGNOSIS — H409 Unspecified glaucoma: Secondary | ICD-10-CM | POA: Diagnosis present

## 2021-07-20 DIAGNOSIS — D638 Anemia in other chronic diseases classified elsewhere: Secondary | ICD-10-CM | POA: Diagnosis present

## 2021-07-20 DIAGNOSIS — Z79899 Other long term (current) drug therapy: Secondary | ICD-10-CM | POA: Diagnosis not present

## 2021-07-20 DIAGNOSIS — C3412 Malignant neoplasm of upper lobe, left bronchus or lung: Secondary | ICD-10-CM | POA: Diagnosis present

## 2021-07-20 DIAGNOSIS — D509 Iron deficiency anemia, unspecified: Secondary | ICD-10-CM | POA: Diagnosis present

## 2021-07-20 DIAGNOSIS — I272 Pulmonary hypertension, unspecified: Secondary | ICD-10-CM | POA: Diagnosis present

## 2021-07-20 DIAGNOSIS — Z87891 Personal history of nicotine dependence: Secondary | ICD-10-CM | POA: Diagnosis not present

## 2021-07-20 DIAGNOSIS — Z7984 Long term (current) use of oral hypoglycemic drugs: Secondary | ICD-10-CM | POA: Diagnosis not present

## 2021-07-20 DIAGNOSIS — Y92009 Unspecified place in unspecified non-institutional (private) residence as the place of occurrence of the external cause: Secondary | ICD-10-CM | POA: Diagnosis not present

## 2021-07-20 DIAGNOSIS — J432 Centrilobular emphysema: Secondary | ICD-10-CM | POA: Diagnosis present

## 2021-07-20 DIAGNOSIS — R001 Bradycardia, unspecified: Secondary | ICD-10-CM | POA: Diagnosis present

## 2021-07-20 DIAGNOSIS — R9431 Abnormal electrocardiogram [ECG] [EKG]: Secondary | ICD-10-CM | POA: Diagnosis not present

## 2021-07-20 DIAGNOSIS — E871 Hypo-osmolality and hyponatremia: Secondary | ICD-10-CM | POA: Diagnosis present

## 2021-07-20 DIAGNOSIS — C7951 Secondary malignant neoplasm of bone: Secondary | ICD-10-CM | POA: Diagnosis present

## 2021-07-20 DIAGNOSIS — E785 Hyperlipidemia, unspecified: Secondary | ICD-10-CM | POA: Diagnosis present

## 2021-07-20 DIAGNOSIS — I10 Essential (primary) hypertension: Secondary | ICD-10-CM | POA: Diagnosis present

## 2021-07-20 DIAGNOSIS — R197 Diarrhea, unspecified: Secondary | ICD-10-CM | POA: Diagnosis present

## 2021-07-20 DIAGNOSIS — E669 Obesity, unspecified: Secondary | ICD-10-CM | POA: Diagnosis present

## 2021-07-20 DIAGNOSIS — Z6835 Body mass index (BMI) 35.0-35.9, adult: Secondary | ICD-10-CM | POA: Diagnosis not present

## 2021-07-20 DIAGNOSIS — G936 Cerebral edema: Secondary | ICD-10-CM | POA: Diagnosis present

## 2021-07-20 LAB — GLUCOSE, CAPILLARY
Glucose-Capillary: 142 mg/dL — ABNORMAL HIGH (ref 70–99)
Glucose-Capillary: 175 mg/dL — ABNORMAL HIGH (ref 70–99)
Glucose-Capillary: 162 mg/dL — ABNORMAL HIGH (ref 70–99)
Glucose-Capillary: 177 mg/dL — ABNORMAL HIGH (ref 70–99)

## 2021-07-20 LAB — VITAMIN B12: Vitamin B-12: 277 pg/mL (ref 180–914)

## 2021-07-20 LAB — PSA: Prostatic Specific Antigen: 4.13 ng/mL — ABNORMAL HIGH (ref 0.00–4.00)

## 2021-07-20 MED ORDER — DORZOLAMIDE HCL 2 % OP SOLN
1.0000 [drp] | Freq: Two times a day (BID) | OPHTHALMIC | Status: DC
  Administered 2021-07-20 – 2021-07-24 (×9): 1 [drp] via OPHTHALMIC
  Filled 2021-07-20: qty 10

## 2021-07-20 MED ORDER — DEXAMETHASONE SODIUM PHOSPHATE 4 MG/ML IJ SOLN
4.0000 mg | Freq: Two times a day (BID) | INTRAMUSCULAR | Status: DC
  Administered 2021-07-20 – 2021-07-24 (×8): 4 mg via INTRAVENOUS
  Filled 2021-07-20 (×8): qty 1

## 2021-07-20 NOTE — Hospital Course (Signed)
Evan Gonzalez is a 82 y.o. male with medical history significant of essential hypertension, type 2 diabetes, prostate cancer with bone mets who presents to the hospital with a fall.  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 CT abdomen/pelvis showed a large right lung mass, multiple liver metastasis.  MRI of the brain showed multiple hemorrhagic metastatic lesions

## 2021-07-20 NOTE — Consult Note (Signed)
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  He was seen by oncology and was provided with steroids via Decadron.  He had a mechanical fall and diarrhea and was admitted for further evaluation.  He admitted to over 20 pound weight loss despite having decent appetite.  He had a CT chest done which I reviewed independently.  CT chest shows significant motion artifact however there is obvious well-circumscribed mass in the anterior subsegment of the left upper lobe abutting anterior chest wall surrounded by centrilobular emphysema.  This is highly concerning for bronchogenic carcinoma.   PAST MEDICAL HISTORY   Past Medical History:  Diagnosis Date   Diabetes mellitus without complication ([place])    Glaucoma    Hyperlipidemia    Hypertension      SURGICAL HISTORY   Past Surgical History:  Procedure Laterality Date   EYE SURGERY     IR BONE TUMOR(S)RF ABLATION  03/28/2019   IR KYPHO THORACIC WITH BONE BIOPSY  03/28/2019   IR RADIOLOGIST EVAL & MGMT  03/21/2019   IR RADIOLOGIST EVAL & MGMT  04/11/2019     FAMILY HISTORY   Family History  Problem Relation Age of Onset   Heart attack Mother    Diabetes Father      SOCIAL HISTORY   Social History   Tobacco Use   Smoking status: Former    Types: Cigarettes    Quit date: 02/08/1987    Years since quitting: 34.4   Smokeless tobacco: Never  Vaping Use   Vaping Use: Never used  Substance Use Topics   Alcohol use: No   Drug use: No     MEDICATIONS    Home Medication:    Current  Medication:  Current Facility-Administered Medications:    acetaminophen (TYLENOL) tablet 650 mg, 650 mg, Oral, Q6H PRN, Sharion Settler, NP, 650 mg at 07/20/21 0054   amLODipine (NORVASC) tablet 5 mg, 5 mg, Oral, Daily, Sharen Hones, MD, 5 mg at 07/20/21 8416   dexamethasone (DECADRON) injection 4 mg, 4 mg, Intravenous, Q12H, Sharen Hones, MD   dorzolamide (TRUSOPT) 2 % ophthalmic solution 1 drop, 1 drop, Both Eyes, BID, Roosevelt Locks, Dekui, MD   insulin aspart (novoLOG) injection 0-9 Units, 0-9 Units, Subcutaneous, TID WC, Sharen Hones, MD, 2 Units at 07/20/21 1211   latanoprost (XALATAN) 0.005 % ophthalmic solution 1 drop, 1 drop, Both Eyes, QHS, Zhang, Dekui, MD, 1 drop at 07/20/21 0059   simvastatin (ZOCOR) tablet 20 mg, 20 mg, Oral, QHS, Sharen Hones, MD, 20 mg at 07/19/21 2207   timolol (TIMOPTIC) 0.25 % ophthalmic solution 1 drop, 1 drop, Both Eyes, BID, Sharen Hones, MD, 1 drop at 07/20/21 0809   traZODone (DESYREL) tablet 50 mg, 50 mg, Oral, QHS, Sharen Hones, MD, 50 mg at 07/19/21 2207    ALLERGIES   Patient has no known allergies.     REVIEW OF SYSTEMS    Review of Systems:  Gen:  Denies  fever, sweats, chills  weigh loss  HEENT: Denies blurred vision, double vision, ear pain, eye pain, hearing loss, nose bleeds, sore throat Cardiac:  No dizziness, chest pain or heaviness, chest tightness,edema Resp:   reports dyspnea chronically  Gi: Denies swallowing difficulty, stomach pain, nausea or vomiting, diarrhea, constipation, bowel incontinence Gu:  Denies bladder incontinence, burning urine Ext:   Denies Joint pain, stiffness or swelling Skin: Denies  skin rash, easy bruising or bleeding or hives Endoc:  Denies polyuria, polydipsia , polyphagia or weight change Psych:   Denies depression, insomnia or hallucinations   Other:  All other systems negative   VS: BP 107/70 (BP Location: Right Arm)   Pulse (!) 58   Temp 97.7 F (36.5 C) (Oral)   Resp 16   Ht 5\' 11"  (1.803 m)    Wt 117 kg   SpO2 98%   BMI 35.98 kg/m      PHYSICAL EXAM    GENERAL:NAD, no fevers, chills, no weakness no fatigue HEAD: Normocephalic, atraumatic.  EYES: Pupils equal, round, reactive to light. Extraocular muscles intact. No scleral icterus.  MOUTH: Moist mucosal membrane. Dentition intact. No abscess noted.  EAR, NOSE, THROAT: Clear without exudates. No external lesions.  NECK: Supple. No thyromegaly. No nodules. No JVD.  PULMONARY: decreased breath sounds with mild rhonchi worse at bases bilaterally.  CARDIOVASCULAR: S1 and S2. Regular rate and rhythm. No murmurs, rubs, or gallops. No edema. Pedal pulses 2+ bilaterally.  GASTROINTESTINAL: Soft, nontender, nondistended. No masses. Positive bowel sounds. No hepatosplenomegaly.  MUSCULOSKELETAL: No swelling, clubbing, or edema. Range of motion full in all extremities.  NEUROLOGIC: Cranial nerves II through XII are intact. No gross focal neurological deficits. Sensation intact. Reflexes intact.  SKIN: No ulceration, lesions, rashes, or cyanosis. Skin warm and dry. Turgor intact.  PSYCHIATRIC: Mood, affect within normal limits. The patient is awake, alert and oriented x 3. Insight, judgment intact.       IMAGING     ASSESSMENT/PLAN   Lung mass of the anterior subsegment of the left upper lobe concerning for bronchogenic carcinoma     -Patient with brain metastasis as well as previous prostate cancer    We discusssed options for biopsy and possilble IR guided vs bronchoscopy    - I recommend to do CT guided approach due to large size and adjacent to chest wall.     This will allow [place] to have tissue diagnosis without putting patient under anesthesia.   Centrilobular emphysema    Patient quit smoking 40 years ago     - his respiratory status is stable             Thank you for allowing me to participate in the care of this patient.   Patient/Family are satisfied with care plan and all questions have been answered.     Provider disclosure: Patient with at least one acute or chronic illness or injury that poses a threat to life or bodily function and is being managed actively during this encounter.  All of the below services have been performed independently by signing provider:  review of prior documentation from internal and or external health records.  Review of previous and current lab results.  Interview and comprehensive assessment during patient visit today. Review of current and previous chest radiographs/CT scans. Discussion of management and test interpretation with health care team and patient/family.   This document was prepared using Dragon voice recognition software and may include unintentional dictation errors.     Ottie Glazier, M.D.  Division of Pulmonary & Critical Care Medicine

## 2021-07-20 NOTE — Progress Notes (Signed)
  Progress Note   Patient: Evan Gonzalez ZMO:[id number] DOB: 04/06/1940 DOA: 07/19/2021     0 DOS: the patient was seen and examined on 07/20/2021   Brief hospital course: Evan Gonzalez is a 81 y.o. male with medical history significant of essential hypertension, type 2 diabetes, prostate cancer with bone mets who presents to the hospital with a fall.  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 CT abdomen/pelvis showed a large right lung mass, multiple liver metastasis.  MRI of the brain showed multiple hemorrhagic metastatic lesions  Assessment and Plan: Prostate cancer with bone mets. 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 Fall at home due to weakness. Based on CT scan and MRI scan, patient most likely has lung cancer with metastasis to the brain and the liver. Evaluate by oncology, request a biopsy.  Will obtain pulmonology consult.  Based on location of lung mass, IR biopsy may be a option.   Hyponatremia. Could be due to lung cancer.  Recheck a BMP tomorrow.  Essential hypertension. Continue amlodipine  Type 2 diabetes. Continue some sliding scale insulin.   Anemia of chronic disease. Patient has a low iron and high ferritin level, consistent with anemia of chronic disease.  Patient also has borderline B12 level, will check homocystine level.        Subjective: Patient doing well today, currently has no complaints other than weakness.  Physical Exam: Vitals:   07/19/21 2012 07/19/21 2113 07/20/21 0428 07/20/21 0755  BP: (P) 104/63 104/63 109/62 107/70  Pulse: (!) (P) 105 (!) 105 66 (!) 58  Resp: (P) 18 18 18 16   Temp: (P) 98.4 F (36.9 C) 98.4 F (36.9 C)  97.7 F (36.5 C)  TempSrc: (P) Oral Oral  Oral  SpO2:   98% 98%  Weight:  117 kg    Height:  5\' 11"  (1.803 m)     General exam: Appears calm and comfortable  Respiratory system: Clear to auscultation. Respiratory effort normal. Cardiovascular system: S1 & S2 heard, RRR. No JVD,  murmurs, rubs, gallops or clicks. No pedal edema. Gastrointestinal system: Abdomen is nondistended, soft and nontender. No organomegaly or masses felt. Normal bowel sounds heard. Central nervous system: Alert and oriented. No focal neurological deficits. Extremities: Symmetric 5 x 5 power. Skin: No rashes, lesions or ulcers Psychiatry: Judgement and insight appear normal. Mood & affect appropriate.   Data Reviewed:  Lab results reviewed.  Family Communication: Son updated at bedside  Disposition: Status is: Inpatient Remains inpatient appropriate because: Severity of disease, pending decision about biopsy.  Planned Discharge Destination: Home    Time spent: 28 minutes  Author: Sharen Hones, MD 07/20/2021 2:24 PM  For on call review [email or web address].

## 2021-07-21 ENCOUNTER — Inpatient Hospital Stay: Payer: Medicare Other

## 2021-07-21 ENCOUNTER — Other Ambulatory Visit: Payer: Medicare Other

## 2021-07-21 ENCOUNTER — Encounter: Payer: Self-pay | Admitting: Internal Medicine

## 2021-07-21 DIAGNOSIS — C7931 Secondary malignant neoplasm of brain: Secondary | ICD-10-CM | POA: Diagnosis not present

## 2021-07-21 LAB — BASIC METABOLIC PANEL
Anion gap: 8 (ref 5–15)
BUN: 24 mg/dL — ABNORMAL HIGH (ref 8–23)
CO2: 22 mmol/L (ref 22–32)
Calcium: 9.3 mg/dL (ref 8.9–10.3)
Chloride: 102 mmol/L (ref 98–111)
Creatinine, Ser: 0.75 mg/dL (ref 0.61–1.24)
GFR, Estimated: >60 mL/min (ref >60)
Glucose, Bld: 163 mg/dL — ABNORMAL HIGH (ref 70–99)
Potassium: 4.6 mmol/L (ref 3.5–5.1)
Sodium: 132 mmol/L — ABNORMAL LOW (ref 135–145)

## 2021-07-21 LAB — CBC
HCT: 27.4 % — ABNORMAL LOW (ref 39.0–52.0)
Hemoglobin: 8.8 g/dL — ABNORMAL LOW (ref 13.0–17.0)
MCH: 27.5 pg (ref 26.0–34.0)
MCHC: 32.1 g/dL (ref 30.0–36.0)
MCV: 85.6 fL (ref 80.0–100.0)
Platelets: 292 10*3/uL (ref 150–400)
RBC: 3.2 MIL/uL — ABNORMAL LOW (ref 4.22–5.81)
RDW: 14.8 % (ref 11.5–15.5)
WBC: 9.5 10*3/uL (ref 4.0–10.5)
nRBC: 0 % (ref 0.0–0.2)

## 2021-07-21 LAB — GLUCOSE, CAPILLARY
Glucose-Capillary: 136 mg/dL — ABNORMAL HIGH (ref 70–99)
Glucose-Capillary: 149 mg/dL — ABNORMAL HIGH (ref 70–99)
Glucose-Capillary: 136 mg/dL — ABNORMAL HIGH (ref 70–99)
Glucose-Capillary: 173 mg/dL — ABNORMAL HIGH (ref 70–99)

## 2021-07-21 LAB — PROTIME-INR
INR: 1.2 (ref 0.8–1.2)
Prothrombin Time: 15 seconds (ref 11.4–15.2)

## 2021-07-21 LAB — APTT: aPTT: 34 seconds (ref 24–36)

## 2021-07-21 LAB — HOMOCYSTEINE: Homocysteine: 7.6 umol/L (ref 0.0–21.3)

## 2021-07-21 MED ORDER — TECHNETIUM TC 99M MEDRONATE IV KIT
20.0000 | PACK | Freq: Once | INTRAVENOUS | Status: AC | PRN
  Administered 2021-07-21: 20.08 via INTRAVENOUS

## 2021-07-21 MED ORDER — SENNA 8.6 MG PO TABS
1.0000 | ORAL_TABLET | Freq: Once | ORAL | Status: AC
Start: 2021-07-21 — End: 2021-07-21
  Administered 2021-07-21: 8.6 mg via ORAL
  Filled 2021-07-21: qty 1

## 2021-07-21 MED ORDER — VITAMIN B-12 1000 MCG PO TABS
1000.0000 ug | ORAL_TABLET | Freq: Every day | ORAL | Status: DC
  Administered 2021-07-21: 1000 ug via ORAL
  Filled 2021-07-21: qty 1

## 2021-07-21 NOTE — Progress Notes (Signed)
Evan Gonzalez  [phone] [phone]  ID: JAVELL BLACKBURN OB: [birth year]  MR#: [id number]  AOZ#:[id number]  Patient Care Team: Pcp, No as PCP - General [facility] Ormond Kathlene November, MD as Consulting Physician (Hematology and Oncology)  CHIEF COMPLAINT: History of prostate cancer now with lung mass and brain metastasis.  INTERVAL HISTORY: Patient feels significantly improved since admission and is asking when he can be discharged.  Report CT-guided biopsy of lung to confirm diagnosis as this afternoon.  Patient offers no specific complaints.  REVIEW OF SYSTEMS:   Review of Systems  Constitutional: Negative.  Negative for fever, malaise/fatigue and weight loss.  Respiratory: Negative.  Negative for cough, hemoptysis and shortness of breath.   Cardiovascular: Negative.  Negative for chest pain and leg swelling.  Gastrointestinal: Negative.  Negative for abdominal pain.  Genitourinary: Negative.  Negative for dysuria.  Musculoskeletal: Negative.  Negative for back pain.  Skin: Negative.  Negative for rash.  Neurological: Negative.  Negative for dizziness, focal weakness, weakness and headaches.  Psychiatric/Behavioral: Negative.  The patient is not nervous/anxious.    As per HPI. Otherwise, a complete review of systems is negative.  PAST MEDICAL HISTORY: Past Medical History:  Diagnosis Date   Diabetes mellitus without complication ([place])    Glaucoma    Hyperlipidemia    Hypertension     PAST SURGICAL HISTORY: Past Surgical History:  Procedure Laterality Date   EYE SURGERY     IR BONE TUMOR(S)RF ABLATION  03/28/2019   IR KYPHO THORACIC WITH BONE BIOPSY  03/28/2019   IR RADIOLOGIST EVAL & MGMT  03/21/2019   IR RADIOLOGIST EVAL & MGMT  04/11/2019    FAMILY HISTORY: Family History  Problem Relation Age of Onset   Heart attack Mother    Diabetes Father     ADVANCED DIRECTIVES (Y/N):  @ADVDIR @  HEALTH MAINTENANCE: Social History   Tobacco Use    Smoking status: Former    Types: Cigarettes    Quit date: 02/08/1987    Years since quitting: 34.4   Smokeless tobacco: Never  Vaping Use   Vaping Use: Never used  Substance Use Topics   Alcohol use: No   Drug use: No     Colonoscopy:  PAP:  Bone density:  Lipid panel:  No Known Allergies  Current Facility-Administered Medications  Medication Dose Route Frequency Provider Last Rate Last Admin   acetaminophen (TYLENOL) tablet 650 mg  650 mg Oral Q6H PRN Sharion Settler, NP   650 mg at 07/21/21 0906   amLODipine (NORVASC) tablet 5 mg  5 mg Oral Daily Sharen Hones, MD   5 mg at 07/21/21 0849   dexamethasone (DECADRON) injection 4 mg  4 mg Intravenous Q12H Sharen Hones, MD   4 mg at 07/21/21 0849   dorzolamide (TRUSOPT) 2 % ophthalmic solution 1 drop  1 drop Both Eyes BID Sharen Hones, MD   1 drop at 07/21/21 0854   insulin aspart (novoLOG) injection 0-9 Units  0-9 Units Subcutaneous TID WC Sharen Hones, MD   1 Units at 07/21/21 1232   latanoprost (XALATAN) 0.005 % ophthalmic solution 1 drop  1 drop Both Eyes QHS Sharen Hones, MD   1 drop at 07/20/21 0059   simvastatin (ZOCOR) tablet 20 mg  20 mg Oral QHS Sharen Hones, MD   20 mg at 07/20/21 2316   timolol (TIMOPTIC) 0.25 % ophthalmic solution 1 drop  1 drop Both Eyes BID Sharen Hones, MD   1 drop at 07/21/21 [phone]  traZODone (DESYREL) tablet 50 mg  50 mg Oral QHS Sharen Hones, MD   50 mg at 07/20/21 2316   vitamin B-12 (CYANOCOBALAMIN) tablet 1,000 mcg  1,000 mcg Oral Daily Sharen Hones, MD   1,000 mcg at 07/21/21 1313    OBJECTIVE: Vitals:   07/21/21 0522 07/21/21 0816  BP: 118/74 114/63  Pulse: (!) 57 (!) 57  Resp: 18 16  Temp: 98 F (36.7 C) 97.6 F (36.4 C)  SpO2: 95% 100%     Body mass index is 35.98 kg/m.    ECOG FS:0 - Asymptomatic  General: Well-developed, well-nourished, no acute distress. Eyes: Pink conjunctiva, anicteric sclera. HEENT: Normocephalic, moist mucous membranes. Lungs: No audible wheezing or  coughing. Heart: Regular rate and rhythm. Abdomen: Soft, nontender, no obvious distention. Musculoskeletal: No edema, cyanosis, or clubbing. Neuro: Alert, answering all questions appropriately. Cranial nerves grossly intact. Skin: No rashes or petechiae noted. Psych: Normal affect. Lymphatics: No cervical, calvicular, axillary or inguinal LAD.   LAB RESULTS:  Lab Results  Component Value Date   NA 132 (L) 07/21/2021   K 4.6 07/21/2021   CL 102 07/21/2021   CO2 22 07/21/2021   GLUCOSE 163 (H) 07/21/2021   BUN 24 (H) 07/21/2021   CREATININE 0.75 07/21/2021   CALCIUM 9.3 07/21/2021   PROT 7.9 07/19/2021   ALBUMIN 3.2 (L) 07/19/2021   AST 27 07/19/2021   ALT 15 07/19/2021   ALKPHOS 29 (L) 07/19/2021   BILITOT 0.7 07/19/2021   GFRNONAA >60 07/21/2021   GFRAA >60 10/10/2019    Lab Results  Component Value Date   WBC 9.5 07/21/2021   NEUTROABS 5.3 07/19/2021   HGB 8.8 (L) 07/21/2021   HCT 27.4 (L) 07/21/2021   MCV 85.6 07/21/2021   PLT 292 07/21/2021     STUDIES: CT Head Wo Contrast  Result Date: 07/19/2021 CLINICAL DATA:  Syncope EXAM: CT HEAD WITHOUT CONTRAST TECHNIQUE: Contiguous axial images were obtained from the base of the skull through the vertex without intravenous contrast. RADIATION DOSE REDUCTION: This exam was performed according to the departmental dose-optimization program which includes automated exposure control, adjustment of the mA and/or kV according to patient size and/or use of iterative reconstruction technique. COMPARISON:  September 24, 2017 CT scan FINDINGS: Brain: No subdural, epidural, or subarachnoid hemorrhage. There is a mass in the right frontal lobe as identified on axial image 20 and sagittal image 30 measuring 1.2 x 1.3 cm. There is adjacent vasogenic edema. There is low attenuation in the left occipital lobe on axial image 17 which could represent a small stroke, new since February 2021. The low-attenuation could be another site of vasogenic edema  but a definitive mass is not noted. No other masses identified. No midline shift. Ventricles and sulci are unremarkable. Cerebellum, brainstem, and basal cisterns are normal. Chronic white matter changes. No acute cortical ischemia or infarct. Vascular: Calcified atherosclerotic change in the intracranial carotids. Skull: Normal. Negative for fracture or focal lesion. Sinuses/Orbits: No acute finding. Other: None. IMPRESSION: 1. There is a 1.2 x 1.3 cm mass in the cortex of the right frontal lobe medially with adjacent vasogenic edema. The finding is concerning for a metastasis. Recommend an MRI. 2. A region of low attenuation in the left occipital lobe could represent a small stroke, new since February 2021 or a second site of vasogenic edema. Recommend attention to this region on follow-up. 3. No midline shift. 4. Chronic white matter changes. Electronically Signed   By: Dorise Bullion III M.D.  On: 07/19/2021 17:03   MR Brain W and Wo Contrast  Result Date: 07/19/2021 CLINICAL DATA:  History of prostate cancer. Acute presentation today after falling. Abnormal head CT. EXAM: MRI HEAD WITHOUT AND WITH CONTRAST TECHNIQUE: Multiplanar, multiecho pulse sequences of the brain and surrounding structures were obtained without and with intravenous contrast. CONTRAST:  12mL GADAVIST GADOBUTROL 1 MMOL/ML IV SOLN COMPARISON:  Head CT earlier same day FINDINGS: Brain: Diffusion imaging does not show any acute or subacute infarction. No abnormality is seen affecting the brainstem or cerebellum. There is cerebral hemispheric atrophy, temporal lobe predominant. There is an 8 mm enhancing mass in the left occipital lobe with surrounding vasogenic edema. There is a 13 mm mass in the right frontal lobe with regional edema. There is a 2 mm enhancing lesion within the cerebellar vermis postcontrast axial image 46. There is a 2 mm enhancing lesion within the left occipital lobe, postcontrast image 62. There is a 3 mm enhancing  lesion in the right external capsule region, postcontrast axial image 69. These most likely represent metastatic disease. Susceptibility weighted imaging shows signal loss, suggesting that there is microhemorrhage within these metastases. 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 There are mild chronic small-vessel ischemic changes affecting the white matter. No hydrocephalus. No extra-axial collection. Vascular: Major vessels at the base of the brain show flow. Skull and upper cervical spine: Negative Sinuses/Orbits: Clear/normal Other: None IMPRESSION: 13 mm micro hemorrhagic metastasis in the right frontal lobe with regional edema. 8 mm micro hemorrhagic metastasis in the left occipital lobe with regional edema. 3 mm micro hemorrhagic metastasis in the external capsule on the right with very minimal associated edema. Punctate/2 mm metastases visible in the cerebellar vermis and left occipital lobe consistent with tiny metastases. Electronically Signed   By: Nelson Chimes M.D.   On: 07/19/2021 19:14   CT CHEST ABDOMEN PELVIS W CONTRAST  Result Date: 07/19/2021 CLINICAL DATA:  Mass in the cortex of the right frontal lobe concerning for metastasis. EXAM: CT CHEST, ABDOMEN, AND PELVIS WITH CONTRAST TECHNIQUE: Multidetector CT imaging of the chest, abdomen and pelvis was performed following the standard protocol during bolus administration of intravenous contrast. RADIATION DOSE REDUCTION: This exam was performed according to the departmental dose-optimization program which includes automated exposure control, adjustment of the mA and/or kV according to patient size and/or use of iterative reconstruction technique. CONTRAST:  130mL OMNIPAQUE IOHEXOL 300 MG/ML  SOLN COMPARISON:  March 10, 2019 FINDINGS: CT CHEST FINDINGS Cardiovascular: There is mild calcification of the  aortic arch, without evidence of aortic aneurysm. Evaluation of the segmental and subsegmental pulmonary arteries within the bilateral lower lobes is limited secondary to patient motion. Normal heart size. No pericardial effusion. Mediastinum/Nodes: Lungs/Pleura: There is moderate severity emphysematous lung disease. A 5.9 cm x 4.2 cm x 6.0 cm heterogeneous lung mass is seen within the anteromedial aspect of the left upper lobe. This represents a new finding when compared to the prior study. There is no evidence of acute infiltrate, pleural effusion or pneumothorax. Musculoskeletal: There is evidence of prior vertebroplasty at the level of T12 vertebral body. Multilevel degenerative changes are seen throughout the thoracic spine. CT ABDOMEN PELVIS FINDINGS Hepatobiliary: A 2.8 cm x 2.9 cm ill-defined low-attenuation liver lesion is seen within the dome of the liver. Additional 0.9 cm and 0.8 cm foci of low attenuation are noted within the right lobe. There  is no evidence of gallstones, gallbladder wall thickening or pericholecystic inflammation. No biliary dilatation is identified. Pancreas: Unremarkable. No pancreatic ductal dilatation or surrounding inflammatory changes. Spleen: No splenic injury or perisplenic hematoma. Adrenals/Urinary Tract: A 1.9 cm x 1.6 cm low-attenuation left adrenal mass is seen (approximately 30.08 Hounsfield units). The right adrenal gland is unremarkable. Kidneys are normal in size, without obstructing renal calculi, focal lesion, or hydronephrosis. Bladder is unremarkable. Stomach/Bowel: There is a small hiatal hernia. Appendix appears normal. No evidence of bowel wall thickening, distention, or inflammatory changes. Noninflamed diverticula are seen within the descending and sigmoid colon. Vascular/Lymphatic: Aortic atherosclerosis. No enlarged abdominal or pelvic lymph nodes. Reproductive: The prostate gland is not clearly identified. Other: No abdominal wall hernia or abnormality. No  abdominopelvic ascites. Musculoskeletal: Multilevel degenerative changes seen throughout the lumbar spine. IMPRESSION: 1. 5.9 cm x 4.2 cm x 6.0 cm heterogeneous lung mass within the anteromedial aspect of the left upper lobe, consistent with a primary lung malignancy. 2. Moderate severity emphysematous lung disease. 3. Multiple low-attenuation liver lesions, concerning for metastatic disease. Correlation with nonemergent hepatic MRI is recommended. 4. Low-attenuation left adrenal mass which may represent an adrenal adenoma. Sequelae associated with metastatic disease cannot completely be excluded. 5. Colonic diverticulosis. 6. Evidence of prior vertebroplasty at the level of T12 vertebral body. 7. Small hiatal hernia. Aortic Atherosclerosis (ICD10-I70.0) and Emphysema (ICD10-J43.9). Electronically Signed   By: Virgina Norfolk M.D.   On: 07/19/2021 19:27   DG Chest Port 1 View  Result Date: 07/19/2021 CLINICAL DATA:  Unwitnessed fall.  Weakness.  Hypotension. EXAM: PORTABLE CHEST 1 VIEW COMPARISON:  02/07/2017 FINDINGS: Heart size is within normal limits. A rounded masslike opacity is seen in the left mid lung measuring approximately 5 x 7 cm. This could be due to carcinoma or pulmonary contusion. No evidence of pleural effusion. Right lung is clear. IMPRESSION: Rounded masslike opacity in left mid lung, which could be due to carcinoma or pulmonary contusion. Chest CT with contrast is recommended for further evaluation. Electronically Signed   By: Marlaine Hind M.D.   On: 07/19/2021 16:50    ASSESSMENT: History of prostate cancer now with lung mass and brain metastasis.  PLAN:    1.  Lung mass: Highly suspicious for a second primary.  Patient noted to have a 6 cm left upper lobe mass with possible liver lesions on CT scan.  MRI of the brain confirmed at least 4 different metastasis largest in the right frontal lobe with surrounding vasogenic edema.  Patient has CT-guided biopsy scheduled for today.  He will  require a PET scan after discharge to complete the staging work-up.  Radiation oncology aware. 2.  Brain metastasis: Patient's symptoms have improved with Decadron.  Please ensure patient is discharged on steroids.  Steroid taper per radiation oncology.  He will require XRT to the brain in the near future. 3.  Prostate cancer: Patient's PSA is stable at 4.13.  His next scheduled Eligard is in August 2023. 4.  Iron deficiency anemia: Patient's most recent hemoglobin has declined to 8.8, monitor.  Will follow.   Lloyd Huger, MD   07/21/2021 2:05 PM

## 2021-07-21 NOTE — Consult Note (Signed)
Chief Complaint: Patient was seen in consultation today for lung mass at the request of Ottie Glazier, MD  Referring Physician(s): Ottie Glazier, MD  Supervising Physician: Daryll Brod  Patient Status: [place] - In-pt  History of Present Illness: Evan Gonzalez is a 81 y.o. male with PMHx significant for HTN, DM, metastatic prostate cancer who presented on 6/3 to ED after a fall x 2. CT head and MR head imaging done with brain mass seen concerning for metastatic disease and CT done which showed a left lung mass. IR received request for lung biopsy. Oncology and pulmonary are following.  The patient denies any current chest pain or shortness of breath. He denies any hemoptysis. He thinks he may have taken aspirin on Saturday morning, he denies any other current blood thinner use, he denies any known bleeding or clotting disorder. The patient denies any history of sleep apnea or chronic oxygen use. He has no known complications to sedation.   Past Medical History:  Diagnosis Date   Diabetes mellitus without complication ([place])    Glaucoma    Hyperlipidemia    Hypertension     Past Surgical History:  Procedure Laterality Date   EYE SURGERY     IR BONE TUMOR(S)RF ABLATION  03/28/2019   IR KYPHO THORACIC WITH BONE BIOPSY  03/28/2019   IR RADIOLOGIST EVAL & MGMT  03/21/2019   IR RADIOLOGIST EVAL & MGMT  04/11/2019    Allergies: Patient has no known allergies.  Medications: Prior to Admission medications   Medication Sig Start Date End Date Taking? Authorizing Provider  acetaminophen (TYLENOL) 650 MG CR tablet Take 650 mg by mouth 2 (two) times daily.    [provider]  amLODipine (NORVASC) 5 MG tablet  01/23/20   [provider]  cetirizine (ZYRTEC) 10 MG tablet Take 10 mg by mouth daily.    [provider]  dipyridamole-aspirin (AGGRENOX) 200-25 MG 12hr capsule Take 1 capsule by mouth 2 (two) times daily.    [provider]  dorzolamide  (TRUSOPT) 2 % ophthalmic solution Place 1 drop into both eyes 2 (two) times daily.    [provider]  fluticasone (FLONASE) 50 MCG/ACT nasal spray Place 2 sprays into both nostrils daily. 02/07/17   Melynda Ripple, MD  latanoprost (XALATAN) 0.005 % ophthalmic solution Place 1 drop into both eyes at bedtime.    [provider]  lisinopril-hydrochlorothiazide (ZESTORETIC) 10-12.5 MG tablet  01/23/20   [provider]  meclizine (ANTIVERT) 25 MG tablet Take 1 tablet (25 mg total) by mouth 3 (three) times daily as needed for dizziness or nausea. 04/03/19   Carrie Mew, MD  metFORMIN (GLUCOPHAGE) 500 MG tablet Take 500 mg by mouth 2 (two) times daily with a meal.    [provider]  NONFORMULARY OR COMPOUNDED ITEM See pharmacy note 11/02/19   Gardiner Barefoot, DPM  ondansetron (ZOFRAN ODT) 4 MG disintegrating tablet Take 1 tablet (4 mg total) by mouth every 8 (eight) hours as needed for nausea or vomiting. 04/03/19   Carrie Mew, MD  simvastatin (ZOCOR) 20 MG tablet Take 20 mg by mouth at bedtime.    [provider]  timolol (TIMOPTIC) 0.25 % ophthalmic solution  07/28/19   [provider]  traZODone (DESYREL) 50 MG tablet Take 50 mg by mouth at bedtime. 12/10/20   [provider]     Family History  Problem Relation Age of Onset   Heart attack Mother    Diabetes Father  Social History   Socioeconomic History   Marital status: Single    Spouse name: Not on file   Number of children: Not on file   Years of education: Not on file   Highest education level: Not on file  Occupational History   Not on file  Tobacco Use   Smoking status: Former    Types: Cigarettes    Quit date: 02/08/1987    Years since quitting: 34.4   Smokeless tobacco: Never  Vaping Use   Vaping Use: Never used  Substance and Sexual Activity   Alcohol use: No   Drug use: No   Sexual activity: Not on file  Other Topics Concern   Not on file   Social History Narrative   Not on file   Social Determinants of Health   Financial Resource Strain: Not on file  Food Insecurity: Not on file  Transportation Needs: Not on file  Physical Activity: Not on file  Stress: Not on file  Social Connections: Not on file   Review of Systems: A 12 point ROS discussed and pertinent positives are indicated in the HPI above.  All other systems are negative.  Review of Systems  Vital Signs: BP 105/64 (BP Location: Left Arm)   Pulse 77   Temp 97.8 F (36.6 C)   Resp 17   Ht 5\' 11"  (1.803 m)   Wt 257 lb 15 oz (117 kg)   SpO2 98%   BMI 35.98 kg/m   Physical Exam Constitutional:      Appearance: Normal appearance.  HENT:     Head: Normocephalic and atraumatic.  Cardiovascular:     Rate and Rhythm: Normal rate and regular rhythm.  Pulmonary:     Effort: Pulmonary effort is normal. No respiratory distress.  Neurological:     Mental Status: He is alert and oriented to person, place, and time.    Imaging: CT Head Wo Contrast  Result Date: 07/19/2021 CLINICAL DATA:  Syncope EXAM: CT HEAD WITHOUT CONTRAST TECHNIQUE: Contiguous axial images were obtained from the base of the skull through the vertex without intravenous contrast. RADIATION DOSE REDUCTION: This exam was performed according to the departmental dose-optimization program which includes automated exposure control, adjustment of the mA and/or kV according to patient size and/or use of iterative reconstruction technique. COMPARISON:  September 24, 2017 CT scan FINDINGS: Brain: No subdural, epidural, or subarachnoid hemorrhage. There is a mass in the right frontal lobe as identified on axial image 20 and sagittal image 30 measuring 1.2 x 1.3 cm. There is adjacent vasogenic edema. There is low attenuation in the left occipital lobe on axial image 17 which could represent a small stroke, new since February 2021. The low-attenuation could be another site of vasogenic edema but a definitive mass  is not noted. No other masses identified. No midline shift. Ventricles and sulci are unremarkable. Cerebellum, brainstem, and basal cisterns are normal. Chronic white matter changes. No acute cortical ischemia or infarct. Vascular: Calcified atherosclerotic change in the intracranial carotids. Skull: Normal. Negative for fracture or focal lesion. Sinuses/Orbits: No acute finding. Other: None. IMPRESSION: 1. There is a 1.2 x 1.3 cm mass in the cortex of the right frontal lobe medially with adjacent vasogenic edema. The finding is concerning for a metastasis. Recommend an MRI. 2. A region of low attenuation in the left occipital lobe could represent a small stroke, new since February 2021 or a second site of vasogenic edema. Recommend attention to this region on follow-up. 3. No midline  shift. 4. Chronic white matter changes. Electronically Signed   By: Dorise Bullion III M.D.   On: 07/19/2021 17:03   MR Brain W and Wo Contrast  Result Date: 07/19/2021 CLINICAL DATA:  History of prostate cancer. Acute presentation today after falling. Abnormal head CT. EXAM: MRI HEAD WITHOUT AND WITH CONTRAST TECHNIQUE: Multiplanar, multiecho pulse sequences of the brain and surrounding structures were obtained without and with intravenous contrast. CONTRAST:  6mL GADAVIST GADOBUTROL 1 MMOL/ML IV SOLN COMPARISON:  Head CT earlier same day FINDINGS: Brain: Diffusion imaging does not show any acute or subacute infarction. No abnormality is seen affecting the brainstem or cerebellum. There is cerebral hemispheric atrophy, temporal lobe predominant. There is an 8 mm enhancing mass in the left occipital lobe with surrounding vasogenic edema. There is a 13 mm mass in the right frontal lobe with regional edema. There is a 2 mm enhancing lesion within the cerebellar vermis postcontrast axial image 46. There is a 2 mm enhancing lesion within the left occipital lobe, postcontrast image 62. There is a 3 mm enhancing lesion in the right  external capsule region, postcontrast axial image 69. These most likely represent metastatic disease. Susceptibility weighted imaging shows signal loss, suggesting that there is microhemorrhage within these metastases. 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 There are mild chronic small-vessel ischemic changes affecting the white matter. No hydrocephalus. No extra-axial collection. Vascular: Major vessels at the base of the brain show flow. Skull and upper cervical spine: Negative Sinuses/Orbits: Clear/normal Other: None IMPRESSION: 13 mm micro hemorrhagic metastasis in the right frontal lobe with regional edema. 8 mm micro hemorrhagic metastasis in the left occipital lobe with regional edema. 3 mm micro hemorrhagic metastasis in the external capsule on the right with very minimal associated edema. Punctate/2 mm metastases visible in the cerebellar vermis and left occipital lobe consistent with tiny metastases. Electronically Signed   By: Nelson Chimes M.D.   On: 07/19/2021 19:14   NM Bone Scan Whole Body  Result Date: 07/21/2021 CLINICAL DATA:  Prostate cancer suspected. PSA result on 07/19/2021 was 4.13. EXAM: NUCLEAR MEDICINE WHOLE BODY BONE SCAN TECHNIQUE: Whole body anterior and posterior images were obtained approximately 3 hours after intravenous injection of radiopharmaceutical. RADIOPHARMACEUTICALS:  20.08 mCi Technetium-50m MDP IV COMPARISON:  CT of the chest abdomen and pelvis on 07/19/2021, CT and MRI of the head on 07/19/2021 FINDINGS: There is bilateral renal activity. Bladder activity is present. There are focal areas of increased activity in the sternoclavicular joints bilaterally, shoulders bilaterally, and RIGHT midfoot region. These are favored to represent degenerative changes. Degenerative changes are also noted in the lumbosacral junction, slightly  asymmetric RIGHT greater than LEFT. There are no focal areas of increased activity to indicate presence of metastatic disease. IMPRESSION: No evidence for osseous metastatic disease. Multifocal areas of increased uptake consistent with degenerative changes. Electronically Signed   By: Nolon Nations M.D.   On: 07/21/2021 15:53   CT CHEST ABDOMEN PELVIS W CONTRAST  Result Date: 07/19/2021 CLINICAL DATA:  Mass in the cortex of the right frontal lobe concerning for metastasis. EXAM: CT CHEST, ABDOMEN, AND PELVIS WITH CONTRAST TECHNIQUE: Multidetector CT imaging of the chest, abdomen and pelvis was performed following the standard protocol during bolus administration of intravenous contrast. RADIATION DOSE REDUCTION: This exam was performed according to the departmental dose-optimization program which includes automated exposure control, adjustment of the mA and/or  kV according to patient size and/or use of iterative reconstruction technique. CONTRAST:  173mL OMNIPAQUE IOHEXOL 300 MG/ML  SOLN COMPARISON:  March 10, 2019 FINDINGS: CT CHEST FINDINGS Cardiovascular: There is mild calcification of the aortic arch, without evidence of aortic aneurysm. Evaluation of the segmental and subsegmental pulmonary arteries within the bilateral lower lobes is limited secondary to patient motion. Normal heart size. No pericardial effusion. Mediastinum/Nodes: Lungs/Pleura: There is moderate severity emphysematous lung disease. A 5.9 cm x 4.2 cm x 6.0 cm heterogeneous lung mass is seen within the anteromedial aspect of the left upper lobe. This represents a new finding when compared to the prior study. There is no evidence of acute infiltrate, pleural effusion or pneumothorax. Musculoskeletal: There is evidence of prior vertebroplasty at the level of T12 vertebral body. Multilevel degenerative changes are seen throughout the thoracic spine. CT ABDOMEN PELVIS FINDINGS Hepatobiliary: A 2.8 cm x 2.9 cm ill-defined low-attenuation  liver lesion is seen within the dome of the liver. Additional 0.9 cm and 0.8 cm foci of low attenuation are noted within the right lobe. There is no evidence of gallstones, gallbladder wall thickening or pericholecystic inflammation. No biliary dilatation is identified. Pancreas: Unremarkable. No pancreatic ductal dilatation or surrounding inflammatory changes. Spleen: No splenic injury or perisplenic hematoma. Adrenals/Urinary Tract: A 1.9 cm x 1.6 cm low-attenuation left adrenal mass is seen (approximately 30.08 Hounsfield units). The right adrenal gland is unremarkable. Kidneys are normal in size, without obstructing renal calculi, focal lesion, or hydronephrosis. Bladder is unremarkable. Stomach/Bowel: There is a small hiatal hernia. Appendix appears normal. No evidence of bowel wall thickening, distention, or inflammatory changes. Noninflamed diverticula are seen within the descending and sigmoid colon. Vascular/Lymphatic: Aortic atherosclerosis. No enlarged abdominal or pelvic lymph nodes. Reproductive: The prostate gland is not clearly identified. Other: No abdominal wall hernia or abnormality. No abdominopelvic ascites. Musculoskeletal: Multilevel degenerative changes seen throughout the lumbar spine. IMPRESSION: 1. 5.9 cm x 4.2 cm x 6.0 cm heterogeneous lung mass within the anteromedial aspect of the left upper lobe, consistent with a primary lung malignancy. 2. Moderate severity emphysematous lung disease. 3. Multiple low-attenuation liver lesions, concerning for metastatic disease. Correlation with nonemergent hepatic MRI is recommended. 4. Low-attenuation left adrenal mass which may represent an adrenal adenoma. Sequelae associated with metastatic disease cannot completely be excluded. 5. Colonic diverticulosis. 6. Evidence of prior vertebroplasty at the level of T12 vertebral body. 7. Small hiatal hernia. Aortic Atherosclerosis (ICD10-I70.0) and Emphysema (ICD10-J43.9). Electronically Signed   By:  Virgina Norfolk M.D.   On: 07/19/2021 19:27   DG Chest Port 1 View  Result Date: 07/19/2021 CLINICAL DATA:  Unwitnessed fall.  Weakness.  Hypotension. EXAM: PORTABLE CHEST 1 VIEW COMPARISON:  02/07/2017 FINDINGS: Heart size is within normal limits. A rounded masslike opacity is seen in the left mid lung measuring approximately 5 x 7 cm. This could be due to carcinoma or pulmonary contusion. No evidence of pleural effusion. Right lung is clear. IMPRESSION: Rounded masslike opacity in left mid lung, which could be due to carcinoma or pulmonary contusion. Chest CT with contrast is recommended for further evaluation. Electronically Signed   By: Marlaine Hind M.D.   On: 07/19/2021 16:50    Labs:  CBC: Recent Labs    04/14/21 0937 07/19/21 1615 07/19/21 2016 07/21/21 0451  WBC 7.3 8.5 7.1 9.5  HGB 11.6* 9.1* 9.7* 8.8*  HCT 37.0* 29.1* 30.5* 27.4*  PLT 249 315 310 292    COAGS: Recent Labs    07/21/21 0451  INR 1.2  APTT 34    BMP: Recent Labs    10/08/20 0956 04/14/21 0937 07/19/21 1615 07/21/21 0451  NA 135 136 134* 132*  K 3.8 4.4 4.6 4.6  CL 101 99 102 102  CO2 26 28 23 22   GLUCOSE 95 111* 114* 163*  BUN 9 12 26* 24*  CALCIUM 8.7* 9.4 8.9 9.3  CREATININE 0.80 0.81 1.20 0.75  GFRNONAA >60 >60 >60 >60    LIVER FUNCTION TESTS: Recent Labs    10/08/20 0956 04/14/21 0937 07/19/21 1615  BILITOT 0.6 0.3 0.7  AST 27 20 27   ALT 18 13 15   ALKPHOS 31* 30* 29*  PROT 7.4 7.7 7.9  ALBUMIN 3.6 3.6 3.2*    Assessment and Plan: This is a 81 year old male with PMHx significant for HTN, DM, metastatic prostate cancer who presented on 6/3 to ED after a fall x 2. CT head and MR head imaging done with brain mass seen concerning for metastatic disease and CT done which showed a left lung mass. IR received request for lung biopsy. Oncology and pulmonary are following.  The patient has been NPO, last dose of possible aspirin Saturday morning per patient- he is unsure if he took  this, imaging, labs and vitals have been reviewed.  Risks and benefits of CT guided lung nodule biopsy was discussed with the patient including, but not limited to bleeding, hemoptysis, respiratory failure requiring intubation, infection, pneumothorax requiring chest tube placement, even death.  All of the patient's questions were answered and the patient is agreeable to proceed.  Consent signed and in chart.   Thank you for this interesting consult.  I greatly enjoyed meeting Evan Gonzalez and look forward to participating in their care.  A copy of this report was sent to the requesting provider on this date.  Electronically Signed: Hedy Jacob, PA-C 07/21/2021, 4:58 PM   I spent a total of 20 Minutes in face to face in clinical consultation, greater than 50% of which was counseling/coordinating care for left lung mass.

## 2021-07-21 NOTE — Progress Notes (Signed)
PHARMACY - PHYSICIAN COMMUNICATION CRITICAL VALUE ALERT - BLOOD CULTURE IDENTIFICATION (BCID)  Initial BCID results: 1 (aerobic) bottle of 4 w/ GPR.  Lab sending to [facility].  Pt afebrile & WBC WNL.  Name of provider contacted: A. Zierle-Ghosh, DO  Changes to prescribed antibiotics required: No changes at this time, continue to monitor.  Renda Rolls, PharmD, [facility] 07/21/2021 5:19 AM

## 2021-07-21 NOTE — Progress Notes (Signed)
  Progress Note   Patient: Evan Gonzalez JZP:[id number] DOB: August 07, 1940 DOA: 07/19/2021     1 DOS: the patient was seen and examined on 07/21/2021   Brief hospital course: Evan Gonzalez is a 81 y.o. male with medical history significant of essential hypertension, type 2 diabetes, prostate cancer with bone mets who presents to the hospital with a fall.  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 CT abdomen/pelvis showed a large right lung mass, multiple liver metastasis.  MRI of the brain showed multiple hemorrhagic metastatic lesions  Assessment and Plan: Prostate cancer with bone mets. 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 Fall at home due to weakness. Continue Decadron for brain mets. Patient has scheduled a PET scan today.  Lung mass biopsy will be performed tomorrow.  Continue keep patient for now.   Hyponatremia. This appears to be secondary to lung cancer.  Essential hypertension. Continue amlodipine  Type 2 diabetes. Continue some sliding scale insulin.   Anemia of chronic disease. Patient has borderline B12, will start oral B12 supplement.  Pending homocystine level.       Subjective:  Doing well today, no headache or confusion. Still significant weakness.  Physical Exam: Vitals:   07/20/21 1545 07/20/21 2030 07/21/21 0522 07/21/21 0816  BP: 114/68 105/60 118/74 114/63  Pulse: 63 (!) 49 (!) 57 (!) 57  Resp: 18 18 18 16   Temp: 97.6 F (36.4 C) 97.7 F (36.5 C) 98 F (36.7 C) 97.6 F (36.4 C)  TempSrc:  Oral Oral Oral  SpO2: 97% 99% 95% 100%  Weight:      Height:       General exam: Appears calm and comfortable  Respiratory system: Clear to auscultation. Respiratory effort normal. Cardiovascular system: S1 & S2 heard, RRR. No JVD, murmurs, rubs, gallops or clicks. No pedal edema. Gastrointestinal system: Abdomen is nondistended, soft and nontender. No organomegaly or masses felt. Normal bowel sounds heard. Central nervous  system: Alert and oriented. No focal neurological deficits. Extremities: Symmetric 5 x 5 power. Skin: No rashes, lesions or ulcers Psychiatry: Judgement and insight appear normal. Mood & affect appropriate.   Data Reviewed:  Labs reviewed.  Family Communication: Son updated at bedside.  Disposition: Status is: Inpatient Remains inpatient appropriate because: Severity of disease.  Pending inpatient procedure.  Planned Discharge Destination: Home    Time spent: 28 minutes  Author: Sharen Hones, MD 07/21/2021 12:34 PM  For on call review [email or web address].

## 2021-07-21 NOTE — Progress Notes (Signed)
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  He was seen by oncology and was provided with steroids via Decadron.  He had a mechanical fall and diarrhea and was admitted for further evaluation.  He admitted to over 20 pound weight loss despite having decent appetite.  He had a CT chest done which I reviewed independently.  CT chest shows significant motion artifact however there is obvious well-circumscribed mass in the anterior subsegment of the left upper lobe abutting anterior chest wall surrounded by centrilobular emphysema.  This is highly concerning for bronchogenic carcinoma.  07/21/21- patient stable no overnight events. Reports mild chest discomfort on left but is on room air doing well.   PAST MEDICAL HISTORY   Past Medical History:  Diagnosis Date   Diabetes mellitus without complication ([place])    Glaucoma    Hyperlipidemia    Hypertension      SURGICAL HISTORY   Past Surgical History:  Procedure Laterality Date   EYE SURGERY     IR BONE TUMOR(S)RF ABLATION  03/28/2019   IR KYPHO THORACIC WITH BONE BIOPSY  03/28/2019   IR RADIOLOGIST EVAL & MGMT  03/21/2019   IR RADIOLOGIST EVAL & MGMT  04/11/2019     FAMILY HISTORY   Family History  Problem Relation Age of Onset   Heart attack Mother    Diabetes Father      SOCIAL HISTORY   Social History   Tobacco Use   Smoking status: Former    Types: Cigarettes    Quit date: 02/08/1987    Years since quitting: 34.4   Smokeless tobacco: Never  Vaping Use   Vaping Use: Never used   Substance Use Topics   Alcohol use: No   Drug use: No     MEDICATIONS    Home Medication:    Current Medication:  Current Facility-Administered Medications:    acetaminophen (TYLENOL) tablet 650 mg, 650 mg, Oral, Q6H PRN, Sharion Settler, NP, 650 mg at 07/21/21 0906   amLODipine (NORVASC) tablet 5 mg, 5 mg, Oral, Daily, Sharen Hones, MD, 5 mg at 07/21/21 0849   dexamethasone (DECADRON) injection 4 mg, 4 mg, Intravenous, Q12H, Sharen Hones, MD, 4 mg at 07/21/21 0849   dorzolamide (TRUSOPT) 2 % ophthalmic solution 1 drop, 1 drop, Both Eyes, BID, Sharen Hones, MD, 1 drop at 07/21/21 0854   insulin aspart (novoLOG) injection 0-9 Units, 0-9 Units, Subcutaneous, TID WC, Sharen Hones, MD, 1 Units at 07/21/21 1232   latanoprost (XALATAN) 0.005 % ophthalmic solution 1 drop, 1 drop, Both Eyes, QHS, Sharen Hones, MD, 1 drop at 07/20/21 0059   simvastatin (ZOCOR) tablet 20 mg, 20 mg, Oral, QHS, Sharen Hones, MD, 20 mg at 07/20/21 2316   timolol (TIMOPTIC) 0.25 % ophthalmic solution 1 drop, 1 drop, Both Eyes, BID, Sharen Hones, MD, 1 drop at 07/21/21 0850   traZODone (DESYREL) tablet 50 mg, 50 mg, Oral, QHS, Sharen Hones, MD, 50 mg at 07/20/21 2316  vitamin B-12 (CYANOCOBALAMIN) tablet 1,000 mcg, 1,000 mcg, Oral, Daily, Sharen Hones, MD, 1,000 mcg at 07/21/21 1313    ALLERGIES   Patient has no known allergies.     REVIEW OF SYSTEMS    Review of Systems:  Gen:  Denies  fever, sweats, chills weigh loss  HEENT: Denies blurred vision, double vision, ear pain, eye pain, hearing loss, nose bleeds, sore throat Cardiac:  No dizziness, chest pain or heaviness, chest tightness,edema Resp:   reports dyspnea chronically  Gi: Denies swallowing difficulty, stomach pain, nausea or vomiting, diarrhea, constipation, bowel incontinence Gu:  Denies bladder incontinence, burning urine Ext:   Denies Joint pain, stiffness or swelling Skin: Denies  skin rash, easy bruising or bleeding or hives Endoc:   Denies polyuria, polydipsia , polyphagia or weight change Psych:   Denies depression, insomnia or hallucinations   Other:  All other systems negative   VS: BP 114/63 (BP Location: Left Arm)   Pulse (!) 57   Temp 97.6 F (36.4 C) (Oral)   Resp 16   Ht 5\' 11"  (1.803 m)   Wt 117 kg   SpO2 100%   BMI 35.98 kg/m      PHYSICAL EXAM    GENERAL:NAD, no fevers, chills, no weakness no fatigue HEAD: Normocephalic, atraumatic.  EYES: Pupils equal, round, reactive to light. Extraocular muscles intact. No scleral icterus.  MOUTH: Moist mucosal membrane. Dentition intact. No abscess noted.  EAR, NOSE, THROAT: Clear without exudates. No external lesions.  NECK: Supple. No thyromegaly. No nodules. No JVD.  PULMONARY: decreased breath sounds with mild rhonchi worse at bases bilaterally.  CARDIOVASCULAR: S1 and S2. Regular rate and rhythm. No murmurs, rubs, or gallops. No edema. Pedal pulses 2+ bilaterally.  GASTROINTESTINAL: Soft, nontender, nondistended. No masses. Positive bowel sounds. No hepatosplenomegaly.  MUSCULOSKELETAL: No swelling, clubbing, or edema. Range of motion full in all extremities.  NEUROLOGIC: Cranial nerves II through XII are intact. No gross focal neurological deficits. Sensation intact. Reflexes intact.  SKIN: No ulceration, lesions, rashes, or cyanosis. Skin warm and dry. Turgor intact.  PSYCHIATRIC: Mood, affect within normal limits. The patient is awake, alert and oriented x 3. Insight, judgment intact.       IMAGING     ASSESSMENT/PLAN   Lung mass of the anterior subsegment of the left upper lobe concerning for bronchogenic carcinoma     -Patient with brain metastasis as well as previous prostate cancer    We discusssed options for biopsy and possilble IR guided vs bronchoscopy    - I recommend to do CT guided approach due to large size and adjacent to chest wall.     This will allow [place] to have tissue diagnosis without putting patient under anesthesia.    Centrilobular emphysema    Patient quit smoking 40 years ago     - his respiratory status is stable             Thank you for allowing me to participate in the care of this patient.   Patient/Family are satisfied with care plan and all questions have been answered.    Provider disclosure: Patient with at least one acute or chronic illness or injury that poses a threat to life or bodily function and is being managed actively during this encounter.  All of the below services have been performed independently by signing provider:  review of prior documentation from internal and or external health records.  Review of previous and current lab results.  Interview and comprehensive  assessment during patient visit today. Review of current and previous chest radiographs/CT scans. Discussion of management and test interpretation with health care team and patient/family.   This document was prepared using Dragon voice recognition software and may include unintentional dictation errors.     Ottie Glazier, M.D.  Division of Pulmonary & Critical Care Medicine

## 2021-07-21 NOTE — Progress Notes (Signed)
  [facility]  [phone] [phone]  ID: Evan Gonzalez OB: [birth year]  MR#: [id number]  AOZ#:[id number]  Patient Care Team: Pcp, No as PCP - General [facility]ond Kathlene November, MD as Consulting Physician (Hematology and Oncology)   Entered in error.  Lloyd Huger, MD   07/21/2021 12:29 PM

## 2021-07-22 ENCOUNTER — Encounter: Payer: Self-pay | Admitting: Internal Medicine

## 2021-07-22 ENCOUNTER — Ambulatory Visit: Payer: Medicare Other | Admitting: Radiation Oncology

## 2021-07-22 LAB — GLUCOSE, CAPILLARY
Glucose-Capillary: 151 mg/dL — ABNORMAL HIGH (ref 70–99)
Glucose-Capillary: 136 mg/dL — ABNORMAL HIGH (ref 70–99)
Glucose-Capillary: 155 mg/dL — ABNORMAL HIGH (ref 70–99)
Glucose-Capillary: 168 mg/dL — ABNORMAL HIGH (ref 70–99)

## 2021-07-22 NOTE — Plan of Care (Signed)

## 2021-07-22 NOTE — Consult Note (Signed)
NEW PATIENT EVALUATION  Name: Evan Gonzalez  MRN: [id number]  Date:   07/19/2021     DOB: Feb 14, 1941   This 81 y.o. 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  REFERRING PHYSICIAN: No ref. provider found  CHIEF COMPLAINT:  Chief Complaint  Patient presents with   Fall    DIAGNOSIS: The primary encounter diagnosis was Fall, initial encounter. A diagnosis of Metastatic cancer to brain [facility]) was also pertinent to this visit.   PREVIOUS INVESTIGATIONS:  MRI of brain CT scan of chest bone scan all reviewed Pathology pending Labs reviewed  HPI: Patient is an 81 year old male with a history of castrate sensitive metastatic prostate cancer who was recently presented with increasing fall.  He had a CT scan of his head showing 1.2 x 1.3 cm mass in the cortex with vasogenic edema.  MRI of the brain show at least 4 lesions consistent with metastatic disease to the brain.  CT scan of his chest showed a 5.9 x 4.2 x 6 cm heterogeneous mass in the anterior medial aspect of the left upper lobe consistent with primary lung cancer.  Patient also had multiple low-attenuation liver lesions consistent with metastatic disease also had a low-attenuation left adrenal mass which possibly could also be metastatic disease.  He has been started on steroids is ready for discharge seen today in his hospital room accompanied by family members.  He specifically denies any change in visual fields any focal neurologic deficits.  PLANNED TREATMENT REGIMEN: Whole brain radiation  PAST MEDICAL HISTORY:  has a past medical history of Diabetes mellitus without complication ([place]), Glaucoma, Hyperlipidemia, and Hypertension.    PAST SURGICAL HISTORY:  Past Surgical History:  Procedure Laterality Date   EYE SURGERY     IR BONE TUMOR(S)RF ABLATION  03/28/2019   IR KYPHO THORACIC WITH BONE BIOPSY  03/28/2019   IR RADIOLOGIST EVAL & MGMT  03/21/2019   IR  RADIOLOGIST EVAL & MGMT  04/11/2019    FAMILY HISTORY: family history includes Diabetes in his father; Heart attack in his mother.  SOCIAL HISTORY:  reports that he quit smoking about 34 years ago. His smoking use included cigarettes. He has never used smokeless tobacco. He reports that he does not drink alcohol and does not use drugs.  ALLERGIES: Patient has no known allergies.  MEDICATIONS:  Current Facility-Administered Medications  Medication Dose Route Frequency Provider Last Rate Last Admin   acetaminophen (TYLENOL) tablet 650 mg  650 mg Oral Q6H PRN Sharion Settler, NP   650 mg at 07/22/21 0912   amLODipine (NORVASC) tablet 5 mg  5 mg Oral Daily Sharen Hones, MD   5 mg at 07/22/21 0912   dexamethasone (DECADRON) injection 4 mg  4 mg Intravenous Q12H Sharen Hones, MD   4 mg at 07/22/21 0912   dorzolamide (TRUSOPT) 2 % ophthalmic solution 1 drop  1 drop Both Eyes BID Sharen Hones, MD   1 drop at 07/22/21 0839   insulin aspart (novoLOG) injection 0-9 Units  0-9 Units Subcutaneous TID WC Sharen Hones, MD   2 Units at 07/22/21 0912   latanoprost (XALATAN) 0.005 % ophthalmic solution 1 drop  1 drop Both Eyes QHS Sharen Hones, MD   1 drop at 07/21/21 2321   simvastatin (ZOCOR) tablet 20 mg  20 mg Oral QHS Sharen Hones, MD   20 mg at 07/21/21 2019   timolol (TIMOPTIC) 0.25 % ophthalmic solution 1 drop  1 drop Both Eyes BID Sharen Hones, MD   1 drop at 07/22/21 0839   traZODone (DESYREL) tablet 50 mg  50 mg Oral QHS Sharen Hones, MD   50 mg at 07/21/21 2019    ECOG PERFORMANCE STATUS:  1 - Symptomatic but completely ambulatory  REVIEW OF SYSTEMS: Patient has metastatic prostate cancer recent bone scan negative, pulmonary hypertension diabetes cerebrovascular disease Patient denies any weight loss, fatigue, weakness, fever, chills or night sweats. Patient denies any loss of vision, blurred vision. Patient denies any ringing  of the ears or hearing loss. No irregular heartbeat. Patient denies  heart murmur or history of fainting. Patient denies any chest pain or pain radiating to her upper extremities. Patient denies any shortness of breath, difficulty breathing at night, cough or hemoptysis. Patient denies any swelling in the lower legs. Patient denies any nausea vomiting, vomiting of blood, or coffee ground material in the vomitus. Patient denies any stomach pain. Patient states has had normal bowel movements no significant constipation or diarrhea. Patient denies any dysuria, hematuria or significant nocturia. Patient denies any problems walking, swelling in the joints or loss of balance. Patient denies any skin changes, loss of hair or loss of weight. Patient denies any excessive worrying or anxiety or significant depression. Patient denies any problems with insomnia. Patient denies excessive thirst, polyuria, polydipsia. Patient denies any swollen glands, patient denies easy bruising or easy bleeding. Patient denies any recent infections, allergies or URI. Patient "s visual fields have not changed significantly in recent time.   PHYSICAL EXAM: BP 121/69 (BP Location: Left Arm)   Pulse 62   Temp 98.2 F (36.8 C) (Oral)   Resp 16   Ht 5\' 11"  (1.803 m)   Wt 257 lb 15 oz (117 kg)   SpO2 94%   BMI 35.98 kg/m  Crude visual fields within normal range motor or sensory and DTR levels are equal and symmetric in upper lower extremities.  Proprioception is intact.  Well-developed well-nourished patient in NAD. HEENT reveals PERLA, EOMI, discs not visualized.  Oral cavity is clear. No oral mucosal lesions are identified. Neck is clear without evidence of cervical or supraclavicular adenopathy. Lungs are clear to A&P. Cardiac examination is essentially unremarkable with regular rate and rhythm without murmur rub or thrill. Abdomen is benign with no organomegaly or masses noted. Motor sensory and DTR levels are equal and symmetric in the upper and lower extremities. Cranial nerves II through XII are  grossly intact. Proprioception is intact. No peripheral adenopathy or edema is identified. No motor or sensory levels are noted. Crude visual fields are within normal range.  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  We will plan on delivering 30 Gray in 10 fractions.  Risks and benefits of treatment including hair loss skin reaction alteration of blood counts fatigue all were reviewed in detail with the patient and family members.  I have personally set up and ordered CT simulation for tomorrow.  Patient will continue on Decadron therapy at the time of discharge.  Once we have pathology may make some other palliative suggestions although systemic chemotherapy seems to be in order under medical  oncology's direction.  I would like to take this opportunity to thank you for allowing me to participate in the care of your patient.Noreene Filbert, MD

## 2021-07-22 NOTE — Progress Notes (Addendum)
  Progress Note   Patient: Evan Gonzalez SRP:[id number] DOB: Apr 09, 1940 DOA: 07/19/2021     2 DOS: the patient was seen and examined on 07/22/2021   Brief hospital course: MARTIN BELLING is a 81 y.o. male with medical history significant of essential hypertension, type 2 diabetes, prostate cancer with bone mets who presents to the hospital with a fall.  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 CT abdomen/pelvis showed a large right lung mass, multiple liver metastasis.  MRI of the brain showed multiple hemorrhagic metastatic lesions  Assessment and Plan: Prostate cancer Lung cancer with liver metastasis and brain lesion. Fall at home due to weakness. PET scan did not show any bone lesion. Patient is scheduled for CT-guided lung biopsy tomorrow. Continue Decadron for brain mets. Patient can be discharged home tomorrow after biopsy.   Hyponatremia. Secondary to lung cancer, stable.  Essential hypertension. Continue amlodipine  Type 2 diabetes. Continue some sliding scale insulin.   Anemia of chronic disease. B12 borderline, homocystine level normal.  Patient does not have a B12 deficiency  Positive blood culture. 2/4 bottles positive for gram-negative rods, discussed with micro lab, consistent with corynebacteria.  Clinically, there is no source of infection.  Likely contaminants, will repeat culture before starting any antibiotics.    Subjective:  Patient doing well, currently does not have any nausea vomiting or headache or dizziness.  Physical Exam: Vitals:   07/21/21 0816 07/21/21 1607 07/21/21 2016 07/22/21 0725  BP: 114/63 105/64 109/66 121/69  Pulse: (!) 57 77 (!) 58 62  Resp: 16 17 16 16   Temp: 97.6 F (36.4 C) 97.8 F (36.6 C) 97.7 F (36.5 C) 98.2 F (36.8 C)  TempSrc: Oral   Oral  SpO2: 100% 98% 100% 94%  Weight:      Height:       General exam: Appears calm and comfortable  Respiratory system: Clear to auscultation. Respiratory effort  normal. Cardiovascular system: S1 & S2 heard, RRR. No JVD, murmurs, rubs, gallops or clicks. No pedal edema. Gastrointestinal system: Abdomen is nondistended, soft and nontender. No organomegaly or masses felt. Normal bowel sounds heard. Central nervous system: Alert and oriented. No focal neurological deficits. Extremities: Symmetric 5 x 5 power. Skin: No rashes, lesions or ulcers Psychiatry: Judgement and insight appear normal. Mood & affect appropriate.   Data Reviewed:  Bone scan results reviewed, lab results reviewed.  Family Communication: Son at bedside.  Disposition: Status is: Inpatient Remains inpatient appropriate because: Severity of disease, IV Decadron, scheduled lung biopsy.  Planned Discharge Destination: Home with Home Health    Time spent: 28 minutes  Author: Sharen Hones, MD 07/22/2021 1:47 PM  For on call review [email or web address].

## 2021-07-22 NOTE — Progress Notes (Signed)
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  He was seen by oncology and was provided with steroids via Decadron.  He had a mechanical fall and diarrhea and was admitted for further evaluation.  He admitted to over 20 pound weight loss despite having decent appetite.  He had a CT chest done which I reviewed independently.  CT chest shows significant motion artifact however there is obvious well-circumscribed mass in the anterior subsegment of the left upper lobe abutting anterior chest wall surrounded by centrilobular emphysema.  This is highly concerning for bronchogenic carcinoma.  07/21/21- patient stable no overnight events. Reports mild chest discomfort on left but is on room air doing well.   07/22/21- Patient seen at bedside , multiple family members in room.  He is in good spirits. He knows about procedure , family had additional questions.  We discussed NPO post midnight.  Anticipate dc home post procedure in am. He is cleared to dc home from pulmonary. He is low risk for lung biopsy from pulmonary perspective.   PAST MEDICAL HISTORY   Past Medical History:  Diagnosis Date   Diabetes mellitus without complication ([place])    Glaucoma    Hyperlipidemia    Hypertension      SURGICAL HISTORY   Past Surgical History:  Procedure Laterality Date   EYE SURGERY     IR BONE TUMOR(S)RF ABLATION  03/28/2019   IR KYPHO THORACIC WITH BONE BIOPSY  03/28/2019   IR RADIOLOGIST EVAL & MGMT  03/21/2019   IR RADIOLOGIST EVAL & MGMT  04/11/2019     FAMILY HISTORY    Family History  Problem Relation Age of Onset   Heart attack Mother    Diabetes Father      SOCIAL HISTORY   Social History   Tobacco Use   Smoking status: Former    Types: Cigarettes    Quit date: 02/08/1987    Years since quitting: 34.4   Smokeless tobacco: Never  Vaping Use   Vaping Use: Never used  Substance Use Topics   Alcohol use: No   Drug use: No     MEDICATIONS    Home Medication:    Current Medication:  Current Facility-Administered Medications:    acetaminophen (TYLENOL) tablet 650 mg, 650 mg, Oral, Q6H PRN, Sharion Settler, NP, 650 mg at 07/22/21 0912   amLODipine (NORVASC) tablet 5 mg, 5 mg, Oral, Daily, Sharen Hones, MD, 5 mg at 07/22/21 0912   dexamethasone (DECADRON) injection 4 mg, 4 mg, Intravenous, Q12H, Sharen Hones, MD, 4 mg at 07/22/21 0912   dorzolamide (TRUSOPT) 2 % ophthalmic solution 1 drop, 1 drop, Both Eyes, BID, Sharen Hones, MD, 1 drop at 07/22/21 0839   insulin aspart (novoLOG) injection 0-9 Units, 0-9 Units, Subcutaneous, TID WC, Sharen Hones, MD, 2 Units at 07/22/21 0912   latanoprost (XALATAN) 0.005 % ophthalmic solution 1 drop, 1 drop, Both Eyes, QHS, Sharen Hones, MD, 1 drop at 07/21/21 2321   simvastatin (  ZOCOR) tablet 20 mg, 20 mg, Oral, QHS, Sharen Hones, MD, 20 mg at 07/21/21 2019   timolol (TIMOPTIC) 0.25 % ophthalmic solution 1 drop, 1 drop, Both Eyes, BID, Sharen Hones, MD, 1 drop at 07/22/21 0839   traZODone (DESYREL) tablet 50 mg, 50 mg, Oral, QHS, Sharen Hones, MD, 50 mg at 07/21/21 2019    ALLERGIES   Patient has no known allergies.     REVIEW OF SYSTEMS    Review of Systems:  Gen:  Denies  fever, sweats, chills weigh loss  HEENT: Denies blurred vision, double vision, ear pain, eye pain, hearing loss, nose bleeds, sore throat Cardiac:  No dizziness, chest pain or heaviness, chest tightness,edema Resp:   reports dyspnea chronically  Gi: Denies swallowing difficulty, stomach pain, nausea or vomiting,  diarrhea, constipation, bowel incontinence Gu:  Denies bladder incontinence, burning urine Ext:   Denies Joint pain, stiffness or swelling Skin: Denies  skin rash, easy bruising or bleeding or hives Endoc:  Denies polyuria, polydipsia , polyphagia or weight change Psych:   Denies depression, insomnia or hallucinations   Other:  All other systems negative   VS: BP 121/69 (BP Location: Left Arm)   Pulse 62   Temp 98.2 F (36.8 C) (Oral)   Resp 16   Ht 5\' 11"  (1.803 m)   Wt 117 kg   SpO2 94%   BMI 35.98 kg/m      PHYSICAL EXAM    GENERAL:NAD, no fevers, chills, no weakness no fatigue HEAD: Normocephalic, atraumatic.  EYES: Pupils equal, round, reactive to light. Extraocular muscles intact. No scleral icterus.  MOUTH: Moist mucosal membrane. Dentition intact. No abscess noted.  EAR, NOSE, THROAT: Clear without exudates. No external lesions.  NECK: Supple. No thyromegaly. No nodules. No JVD.  PULMONARY: decreased breath sounds with mild rhonchi worse at bases bilaterally.  CARDIOVASCULAR: S1 and S2. Regular rate and rhythm. No murmurs, rubs, or gallops. No edema. Pedal pulses 2+ bilaterally.  GASTROINTESTINAL: Soft, nontender, nondistended. No masses. Positive bowel sounds. No hepatosplenomegaly.  MUSCULOSKELETAL: No swelling, clubbing, or edema. Range of motion full in all extremities.  NEUROLOGIC: Cranial nerves II through XII are intact. No gross focal neurological deficits. Sensation intact. Reflexes intact.  SKIN: No ulceration, lesions, rashes, or cyanosis. Skin warm and dry. Turgor intact.  PSYCHIATRIC: Mood, affect within normal limits. The patient is awake, alert and oriented x 3. Insight, judgment intact.       IMAGING     ASSESSMENT/PLAN   Lung mass of the anterior subsegment of the left upper lobe concerning for bronchogenic carcinoma     -Patient with brain metastasis as well as previous prostate cancer    We discusssed options for biopsy and possilble IR  guided vs bronchoscopy    - IR consultation in progress for chest wall lesion.     This will allow [place] to have tissue diagnosis without putting patient under anesthesia.   Centrilobular emphysema    Patient quit smoking 40 years ago     - his respiratory status is stable    Pulmonary preoperative evaluation    - patient is low risk for IR lung biopsy      Thank you for allowing me to participate in the care of this patient.   Patient/Family are satisfied with care plan and all questions have been answered.    Provider disclosure: Patient with at least one acute or chronic illness or injury that poses a threat to life or bodily function and is being  managed actively during this encounter.  All of the below services have been performed independently by signing provider:  review of prior documentation from internal and or external health records.  Review of previous and current lab results.  Interview and comprehensive assessment during patient visit today. Review of current and previous chest radiographs/CT scans. Discussion of management and test interpretation with health care team and patient/family.   This document was prepared using Dragon voice recognition software and may include unintentional dictation errors.     Ottie Glazier, M.D.  Division of Pulmonary & Critical Care Medicine

## 2021-07-23 ENCOUNTER — Ambulatory Visit: Payer: Medicare Other

## 2021-07-23 ENCOUNTER — Inpatient Hospital Stay: Payer: Medicare Other

## 2021-07-23 ENCOUNTER — Inpatient Hospital Stay: Admit: 2021-07-23 | Payer: Medicare Other

## 2021-07-23 ENCOUNTER — Inpatient Hospital Stay (HOSPITAL_COMMUNITY)
Admit: 2021-07-23 | Discharge: 2021-07-23 | Disposition: A | Discharge status: Home / Self Care | Payer: Medicare Other | Attending: Cardiology | Admitting: Cardiology

## 2021-07-23 DIAGNOSIS — R9431 Abnormal electrocardiogram [ECG] [EKG]: Secondary | ICD-10-CM

## 2021-07-23 DIAGNOSIS — R001 Bradycardia, unspecified: Secondary | ICD-10-CM

## 2021-07-23 LAB — MAGNESIUM: Magnesium: 1.7 mg/dL (ref 1.7–2.4)

## 2021-07-23 LAB — ECHOCARDIOGRAM COMPLETE
AR max vel: 2.54 cm2
AV Area VTI: 2.78 cm2
AV Area mean vel: 2.85 cm2
AV Mean grad: 2 mmHg
AV Peak grad: 3.3 mmHg
Ao pk vel: 0.9 m/s
Area-P 1/2: 4.06 cm2
Height: 71 in
MV VTI: 2.44 cm2
S' Lateral: 2.4 cm
Weight: 4127.01 oz

## 2021-07-23 LAB — GLUCOSE, CAPILLARY
Glucose-Capillary: 155 mg/dL — ABNORMAL HIGH (ref 70–99)
Glucose-Capillary: 124 mg/dL — ABNORMAL HIGH (ref 70–99)
Glucose-Capillary: 177 mg/dL — ABNORMAL HIGH (ref 70–99)
Glucose-Capillary: 161 mg/dL — ABNORMAL HIGH (ref 70–99)

## 2021-07-23 LAB — TSH: TSH: 0.132 u[IU]/mL — ABNORMAL LOW (ref 0.350–4.500)

## 2021-07-23 LAB — CBC
HCT: 29.9 % — ABNORMAL LOW (ref 39.0–52.0)
Hemoglobin: 9.5 g/dL — ABNORMAL LOW (ref 13.0–17.0)
MCH: 27.7 pg (ref 26.0–34.0)
MCHC: 31.8 g/dL (ref 30.0–36.0)
MCV: 87.2 fL (ref 80.0–100.0)
Platelets: 301 10*3/uL (ref 150–400)
RBC: 3.43 MIL/uL — ABNORMAL LOW (ref 4.22–5.81)
RDW: 15.2 % (ref 11.5–15.5)
WBC: 10.6 10*3/uL — ABNORMAL HIGH (ref 4.0–10.5)
nRBC: 0 % (ref 0.0–0.2)

## 2021-07-23 LAB — BASIC METABOLIC PANEL
Anion gap: 6 (ref 5–15)
BUN: 18 mg/dL (ref 8–23)
CO2: 26 mmol/L (ref 22–32)
Calcium: 9.5 mg/dL (ref 8.9–10.3)
Chloride: 101 mmol/L (ref 98–111)
Creatinine, Ser: 0.63 mg/dL (ref 0.61–1.24)
GFR, Estimated: >60 mL/min (ref >60)
Glucose, Bld: 161 mg/dL — ABNORMAL HIGH (ref 70–99)
Potassium: 4.6 mmol/L (ref 3.5–5.1)
Sodium: 133 mmol/L — ABNORMAL LOW (ref 135–145)

## 2021-07-23 MED ORDER — MIDAZOLAM HCL 2 MG/2ML IJ SOLN
INTRAMUSCULAR | Status: AC
  Filled 2021-07-23: qty 2

## 2021-07-23 MED ORDER — FENTANYL CITRATE (PF) 100 MCG/2ML IJ SOLN
INTRAMUSCULAR | Status: AC
  Filled 2021-07-23: qty 2

## 2021-07-23 MED ORDER — SODIUM CHLORIDE 0.9 % IV SOLN
INTRAVENOUS | Status: DC | PRN
  Administered 2021-07-23: 20 mL/h via INTRAVENOUS

## 2021-07-23 MED ORDER — POLYETHYLENE GLYCOL 3350 17 G PO PACK
17.0000 g | PACK | Freq: Two times a day (BID) | ORAL | Status: DC
  Administered 2021-07-23 – 2021-07-24 (×3): 17 g via ORAL
  Filled 2021-07-23 (×3): qty 1

## 2021-07-23 MED ORDER — SENNOSIDES-DOCUSATE SODIUM 8.6-50 MG PO TABS
2.0000 | ORAL_TABLET | Freq: Two times a day (BID) | ORAL | Status: DC
  Administered 2021-07-23 – 2021-07-24 (×2): 2 via ORAL
  Filled 2021-07-23 (×3): qty 2

## 2021-07-23 NOTE — Care Management Important Message (Signed)
Important Message  Patient Details  Name: Evan Gonzalez MRN: [id number] Date of Birth: November 10, 1940   Medicare Important Message Given:  N/A - LOS <3 / Initial given by admissions     Juliann Pulse A [name] 07/23/2021, 8:56 AM

## 2021-07-23 NOTE — TOC Initial Note (Signed)
Transition of Care [facility]) - Initial/Assessment Note    Patient Details  Name: Evan Gonzalez MRN: [id number] Date of Birth: 1940/10/17  [facility]) CM/SW Contact:    Pete Pelt, RN Phone Number: 07/23/2021, 2:34 PM  Clinical Narrative:       Patient lives at home with son, who is currently at bedside.  Son and patient state that patient was driving prior to admission, but he plans to refrain from driving due to illness.  Son works 7-4 but can pick up patient prescriptions, assist with medication compliance and some of patient's care.    Son and patient state that patient is fairly independent at home, but gets "wobbly and unsteady" when ambulating at home.  RNCM notified MD, who will place a consult to Physical Therapy to assess.  Son and patient would like a rolling walker for patient to discharge with, RNCM awaiting PT OT evaluation for recommended DME at this time.  Patient and son are amenable to discharging home with home health if so recommended.  RNCM await therapy recommendations for home health orders.  Son states he can transport patient to appointments on occasion-when job allows-but overall they are searching for transportation.  Discussed the volunteer program at the [employer] as a possibility for transport.  Information provided to patient and son from the [facility] website.   Discussed son contacting Insurance [facility]) to research transportation options through insurance.  Also discussed neighborhood/community/family and friend connection possibility for transportation.  Son will evaluate, but no concrete plan at this time, he will look into these options.     Expected Discharge Plan: [place] Barriers to Discharge: Continued Medical Work up   Patient Goals and CMS Choice        Expected Discharge Plan and Services Expected Discharge Plan: [place]   Discharge Planning Services: CM Consult Post  Acute Care Choice: Home Health, Durable Medical Equipment (PT OT to evaluate further) Living arrangements for the past 2 months: Single Family Home                                      Prior Living Arrangements/Services Living arrangements for the past 2 months: Single Family Home Lives with:: Self, Adult Children Patient language and need for interpreter reviewed:: Yes Do you feel safe going back to the place where you live?: Yes      Need for Family Participation in Patient Care: Yes (Comment) Care giver support system in place?: Yes (comment)   Criminal Activity/Legal Involvement Pertinent to Current Situation/Hospitalization: No - Comment as needed  Activities of Daily Living Home Assistive Devices/Equipment: None ADL Screening (condition at time of admission) Patient's cognitive ability adequate to safely complete daily activities?: Yes Is the patient deaf or have difficulty hearing?: No Does the patient have difficulty seeing, even when wearing glasses/contacts?: No Does the patient have difficulty concentrating, remembering, or making decisions?: No Patient able to express need for assistance with ADLs?: Yes Does the patient have difficulty dressing or bathing?: Yes Independently performs ADLs?: Yes (appropriate for developmental age) Does the patient have difficulty walking or climbing stairs?: Yes Weakness of Legs: Both Weakness of Arms/Hands: None  Permission Sought/Granted Permission sought to share information with : Case Manager Permission granted to share information with : Yes, Verbal Permission Granted     Permission granted to share info w  AGENCY: Potential home health agency and DME company        Emotional Assessment Appearance:: Appears stated age Attitude/Demeanor/Rapport: Gracious, Engaged Affect (typically observed): Pleasant, Appropriate Orientation: : Oriented to Self, Oriented to Place, Oriented to  Time, Oriented to Situation Alcohol /  Substance Use: Not Applicable Psych Involvement: No (comment)  Admission diagnosis:  Metastatic cancer to brain [facility]) [C79.31] Fall, initial encounter [W19.XXXA] Patient Active Problem List   Diagnosis Date Noted   Bradycardia    Obesity (BMI 35.0-39.9 without comorbidity) 07/20/2021   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), left ([place]) 07/20/2021   Metastatic cancer to brain [facility]) 07/19/2021   Hyponatremia 07/19/2021   Genetic testing 04/24/2020   Prostate cancer metastatic to bone ([place]) 03/11/2020   Prostate cancer ([place]) 03/11/2019   Lymphadenopathy 02/21/2019   Bone lesion 02/21/2019   Acute right flank pain 01/30/2019   Vertigo 03/23/2018   Acute right-sided low back pain with right-sided sciatica 10/06/2017   Impacted cerumen of left ear 10/06/2017   Right rotator cuff tear arthropathy 02/10/2017   Aortic atherosclerosis ([place]) 10/22/2015   Benign paroxysmal positional vertigo due to bilateral vestibular disorder 09/18/2015   Cerebral aneurysm without rupture 09/18/2015   Essential hypertension 09/18/2015   Risk for falls 08/29/2015   Pulmonary hypertension ([place]) 12/07/2012   Dyspnea 10/05/2012   Headache 10/05/2012   Leg swelling 10/05/2012   Cerebrovascular disease 07/21/2012   Constipation 07/21/2012   Diabetes ([place]) 07/21/2012   OSA on CPAP 07/21/2012   Osteoarthritis of both hips 07/21/2012   Complete rupture of rotator cuff 04/08/2012   PCP:  Pcp, No Pharmacy:   [id number] - Cl[facility] - [place], [place] - [address] [place] [address] Phone: [phone] Fax: [phone]  [id number] - [place], [place] [address] Phone: [phone] Fax: [phone]     Social Determinants of Health (SDOH) Interventions    Readmission Risk Interventions     View : No data to display.

## 2021-07-23 NOTE — Progress Notes (Signed)
*  PRELIMINARY RESULTS* Echocardiogram 2D Echocardiogram has been performed.  Sherrie Sport 07/23/2021, 2:02 PM

## 2021-07-23 NOTE — Consult Note (Signed)
Cardiology Consultation:   Patient ID: Evan Gonzalez MRN: [id number]; DOB: Nov 11, 1940  Admit date: 07/19/2021 Date of Consult: 07/23/2021  PCP:  Merryl Hacker, No   [facility] Providers Cardiologist: New  Patient Profile:   Evan Gonzalez is a 81 y.o. male with a hx of HTN, DM2, prostate cancer with bone mets who is being seen 07/23/2021 for the evaluation of bradycardia at the request of Dr. Sheppard Coil.  History of Present Illness:   Evan Gonzalez has not been evaluated by cardiology in the past. No h/o MI or stent. He lives with his son. He doesn't use a cane or walker. Still drives and takes care of himself for the most part. Remote tobacco use. No alcohol or drug use.   He was admitted 07/19/21 for a fall that occurred when he was trying to get into his car at a gas station. He was unable to hold onto the handle and fell. Bystanders helped him and and he drove home and fell again, and EMS was called. Denies blacking out. He had diarrhea previously and subsequent weakness. Denies chest pain, SOB, LLE, lightheadedness, dizziness, or palpitations. Reported 20lb weight loss.CT scan showed metastatic brain lesion with edema. Oncology was consulted. He was scheduled for lung biopsy 6/7 however it was noted heart rates were in the 40s and cardiology was consulted. HE uses timolol eyedrops, most recent use was this AM.   Past Medical History:  Diagnosis Date   Diabetes mellitus without complication ([place])    Glaucoma    Hyperlipidemia    Hypertension     Past Surgical History:  Procedure Laterality Date   EYE SURGERY     IR BONE TUMOR(S)RF ABLATION  03/28/2019   IR KYPHO THORACIC WITH BONE BIOPSY  03/28/2019   IR RADIOLOGIST EVAL & MGMT  03/21/2019   IR RADIOLOGIST EVAL & MGMT  04/11/2019     Home Medications:  Prior to Admission medications   Medication Sig Start Date End Date Taking? Authorizing Provider  amLODipine (NORVASC) 5 MG tablet Take 5 mg by mouth daily. 01/23/20  Yes [provider]  cetirizine (ZYRTEC) 10 MG tablet Take 10 mg by mouth daily.   Yes [provider]  dipyridamole-aspirin (AGGRENOX) 200-25 MG 12hr capsule Take 1 capsule by mouth 2 (two) times daily.   Yes [provider]  dorzolamide (TRUSOPT) 2 % ophthalmic solution Place 1 drop into both eyes 2 (two) times daily.   Yes [provider]  latanoprost (XALATAN) 0.005 % ophthalmic solution Place 1 drop into both eyes at bedtime.   Yes [provider]  lisinopril-hydrochlorothiazide (ZESTORETIC) 10-12.5 MG tablet Take 1 tablet by mouth daily. 01/23/20  Yes [provider]  metFORMIN (GLUCOPHAGE) 500 MG tablet Take 500 mg by mouth 2 (two) times daily with a meal.   Yes [provider]  simvastatin (ZOCOR) 20 MG tablet Take 20 mg by mouth at bedtime.   Yes [provider]  timolol (TIMOPTIC) 0.25 % ophthalmic solution Place 1 drop into both eyes 2 (two) times daily. 07/28/19  Yes [provider]  traZODone (DESYREL) 50 MG tablet Take 50 mg by mouth at bedtime. 12/10/20  Yes [provider]  acetaminophen (TYLENOL) 650 MG CR tablet Take 650 mg by mouth 2 (two) times daily.    [provider]  fluticasone (FLONASE) 50 MCG/ACT nasal spray Place 2 sprays into both nostrils daily. Patient not taking: Reported on 07/21/2021 02/07/17   Melynda Ripple, MD  meclizine (ANTIVERT) 25  MG tablet Take 1 tablet (25 mg total) by mouth 3 (three) times daily as needed for dizziness or nausea. Patient not taking: Reported on 07/21/2021 04/03/19   Carrie Mew, MD  NONFORMULARY OR COMPOUNDED ITEM See pharmacy note 11/02/19   Gardiner Barefoot, DPM  ondansetron (ZOFRAN ODT) 4 MG disintegrating tablet Take 1 tablet (4 mg total) by mouth every 8 (eight) hours as needed for nausea or vomiting. Patient not taking: Reported on 07/21/2021 04/03/19   Carrie Mew, MD    Inpatient Medications: Scheduled Meds:  amLODipine  5 mg Oral Daily    dexamethasone (DECADRON) injection  4 mg Intravenous Q12H   dorzolamide  1 drop Both Eyes BID   insulin aspart  0-9 Units Subcutaneous TID WC   latanoprost  1 drop Both Eyes QHS   simvastatin  20 mg Oral QHS   timolol  1 drop Both Eyes BID   traZODone  50 mg Oral QHS   Continuous Infusions:  sodium chloride 20 mL/hr (07/23/21 0933)   PRN Meds: sodium chloride, acetaminophen  Allergies:   No Known Allergies  Social History:   Social History   Socioeconomic History   Marital status: Single    Spouse name: Not on file   Number of children: Not on file   Years of education: Not on file   Highest education level: Not on file  Occupational History   Not on file  Tobacco Use   Smoking status: Former    Types: Cigarettes    Quit date: 02/08/1987    Years since quitting: 34.4   Smokeless tobacco: Never  Vaping Use   Vaping Use: Never used  Substance and Sexual Activity   Alcohol use: No   Drug use: No   Sexual activity: Not on file  Other Topics Concern   Not on file  Social History Narrative   Not on file   Social Determinants of Health   Financial Resource Strain: Not on file  Food Insecurity: Not on file  Transportation Needs: Not on file  Physical Activity: Not on file  Stress: Not on file  Social Connections: Not on file  Intimate Partner Violence: Not on file    Family History:    Family History  Problem Relation Age of Onset   Heart attack Mother    Diabetes Father      ROS:  Please see the history of present illness.   All other ROS reviewed and negative.     Physical Exam/Data:   Vitals:   07/23/21 0935 07/23/21 0940 07/23/21 0950 07/23/21 1000  BP: 139/71 140/75 117/60 120/61  Pulse: (!) 43 (!) 41 (!) 41 (!) 42  Resp: 17 14 14 14   Temp:      TempSrc:      SpO2: 100% 100% 100% 99%  Weight:      Height:        Intake/Output Summary (Last 24 hours) at 07/23/2021 1149 Last data filed at 07/23/2021 0533 Gross per 24 hour  Intake 600 ml   Output 1650 ml  Net -1050 ml      07/19/2021    9:13 PM 04/14/2021   10:28 AM 10/08/2020   10:24 AM  Last 3 Weights  Weight (lbs) 257 lb 15 oz 257 lb 14.4 oz 266 lb  Weight (kg) 117 kg 116.983 kg 120.657 kg     Body mass index is 35.98 kg/m.  General:  Well nourished, well developed, in no acute distress HEENT: normal Neck: no JVD Vascular:  No carotid bruits; Distal pulses 2+ bilaterally Cardiac:  normal S1, S2; bradycardic, RR; no murmur  Lungs:  clear to auscultation bilaterally, no wheezing, rhonchi or rales  Abd: soft, nontender, no hepatomegaly  Ext: no edema Musculoskeletal:  No deformities, BUE and BLE strength normal and equal Skin: warm and dry  Neuro:  CNs 2-12 intact, no focal abnormalities noted Psych:  Normal affect   EKG:  The EKG was personally reviewed and demonstrates:  Sinus bradcyardia 44bpm, LAD, LAFP, TWI III, T wave fattening aVF Telemetry:  Telemetry was personally reviewed and demonstrates:  ordered  Relevant CV Studies:  Echo ordered  Laboratory Data:  High Sensitivity Troponin:   Recent Labs  Lab 07/19/21 1615 07/19/21 2016  TROPONINIHS 7 11     Chemistry Recent Labs  Lab 07/19/21 1615 07/21/21 0451  NA 134* 132*  K 4.6 4.6  CL 102 102  CO2 23 22  GLUCOSE 114* 163*  BUN 26* 24*  CREATININE 1.20 0.75  CALCIUM 8.9 9.3  GFRNONAA >60 >60  ANIONGAP 9 8    Recent Labs  Lab 07/19/21 1615  PROT 7.9  ALBUMIN 3.2*  AST 27  ALT 15  ALKPHOS 29*  BILITOT 0.7   Lipids No results for input(s): CHOL, TRIG, HDL, LABVLDL, LDLCALC, CHOLHDL in the last 168 hours.  Hematology Recent Labs  Lab 07/19/21 1615 07/19/21 2016 07/21/21 0451  WBC 8.5 7.1 9.5  RBC 3.28* 3.54* 3.20*  HGB 9.1* 9.7* 8.8*  HCT 29.1* 30.5* 27.4*  MCV 88.7 86.2 85.6  MCH 27.7 27.4 27.5  MCHC 31.3 31.8 32.1  RDW 15.0 15.0 14.8  PLT 315 310 292   Thyroid No results for input(s): TSH, FREET4 in the last 168 hours.  BNPNo results for input(s): BNP, PROBNP in the  last 168 hours.  DDimer No results for input(s): DDIMER in the last 168 hours.   Radiology/Studies:  CT Head Wo Contrast  Result Date: 07/19/2021 CLINICAL DATA:  Syncope EXAM: CT HEAD WITHOUT CONTRAST TECHNIQUE: Contiguous axial images were obtained from the base of the skull through the vertex without intravenous contrast. RADIATION DOSE REDUCTION: This exam was performed according to the departmental dose-optimization program which includes automated exposure control, adjustment of the mA and/or kV according to patient size and/or use of iterative reconstruction technique. COMPARISON:  September 24, 2017 CT scan FINDINGS: Brain: No subdural, epidural, or subarachnoid hemorrhage. There is a mass in the right frontal lobe as identified on axial image 20 and sagittal image 30 measuring 1.2 x 1.3 cm. There is adjacent vasogenic edema. There is low attenuation in the left occipital lobe on axial image 17 which could represent a small stroke, new since February 2021. The low-attenuation could be another site of vasogenic edema but a definitive mass is not noted. No other masses identified. No midline shift. Ventricles and sulci are unremarkable. Cerebellum, brainstem, and basal cisterns are normal. Chronic white matter changes. No acute cortical ischemia or infarct. Vascular: Calcified atherosclerotic change in the intracranial carotids. Skull: Normal. Negative for fracture or focal lesion. Sinuses/Orbits: No acute finding. Other: None. IMPRESSION: 1. There is a 1.2 x 1.3 cm mass in the cortex of the right frontal lobe medially with adjacent vasogenic edema. The finding is concerning for a metastasis. Recommend an MRI. 2. A region of low attenuation in the left occipital lobe could represent a small stroke, new since February 2021 or a second site of vasogenic edema. Recommend attention to this region on follow-up. 3. No midline shift.  4. Chronic white matter changes. Electronically Signed   By: Dorise Bullion III  M.D.   On: 07/19/2021 17:03   MR Brain W and Wo Contrast  Result Date: 07/19/2021 CLINICAL DATA:  History of prostate cancer. Acute presentation today after falling. Abnormal head CT. EXAM: MRI HEAD WITHOUT AND WITH CONTRAST TECHNIQUE: Multiplanar, multiecho pulse sequences of the brain and surrounding structures were obtained without and with intravenous contrast. CONTRAST:  31mL GADAVIST GADOBUTROL 1 MMOL/ML IV SOLN COMPARISON:  Head CT earlier same day FINDINGS: Brain: Diffusion imaging does not show any acute or subacute infarction. No abnormality is seen affecting the brainstem or cerebellum. There is cerebral hemispheric atrophy, temporal lobe predominant. There is an 8 mm enhancing mass in the left occipital lobe with surrounding vasogenic edema. There is a 13 mm mass in the right frontal lobe with regional edema. There is a 2 mm enhancing lesion within the cerebellar vermis postcontrast axial image 46. There is a 2 mm enhancing lesion within the left occipital lobe, postcontrast image 62. There is a 3 mm enhancing lesion in the right external capsule region, postcontrast axial image 69. These most likely represent metastatic disease. Susceptibility weighted imaging shows signal loss, suggesting that there is microhemorrhage within these metastases. 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 There are mild chronic small-vessel ischemic changes affecting the white matter. No hydrocephalus. No extra-axial collection. Vascular: Major vessels at the base of the brain show flow. Skull and upper cervical spine: Negative Sinuses/Orbits: Clear/normal Other: None IMPRESSION: 13 mm micro hemorrhagic metastasis in the right frontal lobe with regional edema. 8 mm micro hemorrhagic metastasis in the left occipital lobe with regional edema. 3 mm micro hemorrhagic metastasis in the  external capsule on the right with very minimal associated edema. Punctate/2 mm metastases visible in the cerebellar vermis and left occipital lobe consistent with tiny metastases. Electronically Signed   By: Nelson Chimes M.D.   On: 07/19/2021 19:14   NM Bone Scan Whole Body  Result Date: 07/21/2021 CLINICAL DATA:  Prostate cancer suspected. PSA result on 07/19/2021 was 4.13. EXAM: NUCLEAR MEDICINE WHOLE BODY BONE SCAN TECHNIQUE: Whole body anterior and posterior images were obtained approximately 3 hours after intravenous injection of radiopharmaceutical. RADIOPHARMACEUTICALS:  20.08 mCi Technetium-106m MDP IV COMPARISON:  CT of the chest abdomen and pelvis on 07/19/2021, CT and MRI of the head on 07/19/2021 FINDINGS: There is bilateral renal activity. Bladder activity is present. There are focal areas of increased activity in the sternoclavicular joints bilaterally, shoulders bilaterally, and RIGHT midfoot region. These are favored to represent degenerative changes. Degenerative changes are also noted in the lumbosacral junction, slightly asymmetric RIGHT greater than LEFT. There are no focal areas of increased activity to indicate presence of metastatic disease. IMPRESSION: No evidence for osseous metastatic disease. Multifocal areas of increased uptake consistent with degenerative changes. Electronically Signed   By: Nolon Nations M.D.   On: 07/21/2021 15:53   CT CHEST ABDOMEN PELVIS W CONTRAST  Result Date: 07/19/2021 CLINICAL DATA:  Mass in the cortex of the right frontal lobe concerning for metastasis. EXAM: CT CHEST, ABDOMEN, AND PELVIS WITH CONTRAST TECHNIQUE: Multidetector CT imaging of the chest, abdomen and pelvis was performed following the standard protocol during bolus administration of intravenous contrast. RADIATION DOSE REDUCTION: This exam was performed according to the departmental dose-optimization program which includes automated exposure control, adjustment of the mA and/or kV  according to patient size and/or use of iterative reconstruction technique. CONTRAST:  181mL OMNIPAQUE IOHEXOL 300 MG/ML  SOLN COMPARISON:  March 10, 2019 FINDINGS: CT CHEST FINDINGS Cardiovascular: There is mild calcification of the aortic arch, without evidence of aortic aneurysm. Evaluation of the segmental and subsegmental pulmonary arteries within the bilateral lower lobes is limited secondary to patient motion. Normal heart size. No pericardial effusion. Mediastinum/Nodes: Lungs/Pleura: There is moderate severity emphysematous lung disease. A 5.9 cm x 4.2 cm x 6.0 cm heterogeneous lung mass is seen within the anteromedial aspect of the left upper lobe. This represents a new finding when compared to the prior study. There is no evidence of acute infiltrate, pleural effusion or pneumothorax. Musculoskeletal: There is evidence of prior vertebroplasty at the level of T12 vertebral body. Multilevel degenerative changes are seen throughout the thoracic spine. CT ABDOMEN PELVIS FINDINGS Hepatobiliary: A 2.8 cm x 2.9 cm ill-defined low-attenuation liver lesion is seen within the dome of the liver. Additional 0.9 cm and 0.8 cm foci of low attenuation are noted within the right lobe. There is no evidence of gallstones, gallbladder wall thickening or pericholecystic inflammation. No biliary dilatation is identified. Pancreas: Unremarkable. No pancreatic ductal dilatation or surrounding inflammatory changes. Spleen: No splenic injury or perisplenic hematoma. Adrenals/Urinary Tract: A 1.9 cm x 1.6 cm low-attenuation left adrenal mass is seen (approximately 30.08 Hounsfield units). The right adrenal gland is unremarkable. Kidneys are normal in size, without obstructing renal calculi, focal lesion, or hydronephrosis. Bladder is unremarkable. Stomach/Bowel: There is a small hiatal hernia. Appendix appears normal. No evidence of bowel wall thickening, distention, or inflammatory changes. Noninflamed diverticula are seen  within the descending and sigmoid colon. Vascular/Lymphatic: Aortic atherosclerosis. No enlarged abdominal or pelvic lymph nodes. Reproductive: The prostate gland is not clearly identified. Other: No abdominal wall hernia or abnormality. No abdominopelvic ascites. Musculoskeletal: Multilevel degenerative changes seen throughout the lumbar spine. IMPRESSION: 1. 5.9 cm x 4.2 cm x 6.0 cm heterogeneous lung mass within the anteromedial aspect of the left upper lobe, consistent with a primary lung malignancy. 2. Moderate severity emphysematous lung disease. 3. Multiple low-attenuation liver lesions, concerning for metastatic disease. Correlation with nonemergent hepatic MRI is recommended. 4. Low-attenuation left adrenal mass which may represent an adrenal adenoma. Sequelae associated with metastatic disease cannot completely be excluded. 5. Colonic diverticulosis. 6. Evidence of prior vertebroplasty at the level of T12 vertebral body. 7. Small hiatal hernia. Aortic Atherosclerosis (ICD10-I70.0) and Emphysema (ICD10-J43.9). Electronically Signed   By: Virgina Norfolk M.D.   On: 07/19/2021 19:27   DG Chest Port 1 View  Result Date: 07/19/2021 CLINICAL DATA:  Unwitnessed fall.  Weakness.  Hypotension. EXAM: PORTABLE CHEST 1 VIEW COMPARISON:  02/07/2017 FINDINGS: Heart size is within normal limits. A rounded masslike opacity is seen in the left mid lung measuring approximately 5 x 7 cm. This could be due to carcinoma or pulmonary contusion. No evidence of pleural effusion. Right lung is clear. IMPRESSION: Rounded masslike opacity in left mid lung, which could be due to carcinoma or pulmonary contusion. Chest CT with contrast is recommended for further evaluation. Electronically Signed   By: Marlaine Hind M.D.   On: 07/19/2021 16:50     Assessment and Plan:   Bradycardia - on timolol eyedrops, most recent use this AM - Noted to be bradycardic in IR before biopsy.  - EKG with bradycardia HR 44bpm, no high grade AV  block noted - patient denies any symptoms - check BMET, Mag, TSH and CBC - echo  ordered - will order telemetry  - needs BB washout - will likely need heart monitor as OP  Prostate cancer Lung cancer with liver metastasis and brain lesion - PET with no bone lesion - plan for CT guided lung biopsy>>postponed for bradycardia. Plan to proceed with biopsy tomorrow.  HTN - amlodipine  For questions or updates, please contact [place] Please consult [email or web address] for contact info under    Signed, Diannah Rindfleisch Ninfa Meeker, PA-C  07/23/2021 11:49 AM

## 2021-07-23 NOTE — OR Nursing (Signed)
Procedure on temporary hold due to bradycardia Dr Denna Haggard in contact with prime md. 12 lead done. Procedure not to be done today.

## 2021-07-23 NOTE — Progress Notes (Signed)
IR brief note   Patient presented to CT for planned LUL lung biopsy. Patient was found to have new bradycardia with HR 40s. Otherwise BP normotensive and mentating normally. 12 lead EKG obtained. Discussed briefly with covering hospitalist Dr. Sheppard Coil. Not currently safe to proceed with lung biopsy given new arrhythmia. Will postpone biopsy for now.    Albin Felling, MD  Vascular and Interventional Radiology 07/23/2021 10:31 AM

## 2021-07-23 NOTE — Progress Notes (Signed)
Progress Note   Patient: Evan Gonzalez DOB: Oct 18, 1940 DOA: 07/19/2021     3 DOS: the patient was seen and examined on 07/23/2021   Brief hospital course: Evan Gonzalez is a 81 y.o. male with medical history significant of essential hypertension, type 2 diabetes, prostate cancer with bone mets who presents to the hospital with a fall.  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 CT abdomen/pelvis showed a large right lung mass, multiple liver metastasis.  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 Fall at home due to weakness. PET scan did not show any bone lesion. Patient is scheduled for CT-guided lung biopsy today which had to be delayed d/t bradycardia, cardiology consult placed, agree will hold timolol for now, pending Echo but ok to proceed w/ biopsy from their standpoint. Continue Decadron for brain mets, transition to po on discharge   Bradycardia Sinus bradycardia, asymptomatic  Cardiology consulted, see full note Will hold timolol eye drops for now TSH not indicating hypothyroid, is actually low, will obtain free T4, may need to f/u outpatient    Hyponatremia. Secondary to lung cancer, stable.  Essential hypertension. Continue amlodipine  Type 2 diabetes. Continue sliding scale insulin.   Anemia of chronic disease. B12 borderline, homocystine level normal.  Patient does not have a B12 deficiency  Positive blood culture. 2/4 bottles positive for gram-negative rods, discussed with micro lab, consistent  with corynebacteria.  Clinically, there is no source of infection.  Likely contaminants, will repeat culture before starting any antibiotics.       Subjective:  Patient doing well, currently does not have any nausea vomiting or headache or dizziness. Denies any weakness/dizziness w/ the bradycardia, no chest pain / SOB. Family at bedside.   Physical Exam: Vitals:   07/23/21 0935 07/23/21 0940 07/23/21 0950 07/23/21 1000  BP: 139/71 140/75 117/60 120/61  Pulse: (!) 43 (!) 41 (!) 41 (!) 42  Resp: 17 14 14 14   Temp:      TempSrc:      SpO2: 100% 100% 100% 99%  Weight:      Height:       General exam: Appears calm and comfortable  Respiratory system: Clear to auscultation. Respiratory effort normal. Cardiovascular system: S1 & S2 heard, reg rhythm but bradycardic rate about 40-45. No JVD, murmurs, rubs, gallops or clicks. No pedal edema. Gastrointestinal system: Abdomen is nondistended, soft and nontender. No organomegaly or masses felt. Normal bowel sounds heard. Central nervous system: Alert and oriented. No focal neurological deficits. Extremities: Symmetric 5 x 5 power. Skin: No rashes, lesions or ulcers Psychiatry: Judgement and insight appear normal. Mood & affect appropriate.   Data Reviewed:  Bone scan results reviewed, lab results reviewed.  Family Communication: Son at bedside.  Disposition: Status is: Inpatient Remains inpatient appropriate because: Severity of disease, IV Decadron, scheduled lung biopsy. SHould be able to be d/c if no events on telemetry and no concerns w/ biopsy procedure   Planned Discharge Destination: Home with Home Health    Time spent: 35 minutes  Author: Emeterio Reeve, DO  07/23/2021 2:34 PM  For on call review [email or web address].

## 2021-07-24 ENCOUNTER — Ambulatory Visit: Payer: Medicare Other

## 2021-07-24 ENCOUNTER — Inpatient Hospital Stay: Payer: Medicare Other

## 2021-07-24 DIAGNOSIS — C7931 Secondary malignant neoplasm of brain: Secondary | ICD-10-CM | POA: Insufficient documentation

## 2021-07-24 DIAGNOSIS — C7951 Secondary malignant neoplasm of bone: Secondary | ICD-10-CM | POA: Insufficient documentation

## 2021-07-24 DIAGNOSIS — Z87891 Personal history of nicotine dependence: Secondary | ICD-10-CM | POA: Insufficient documentation

## 2021-07-24 DIAGNOSIS — C61 Malignant neoplasm of prostate: Secondary | ICD-10-CM | POA: Insufficient documentation

## 2021-07-24 DIAGNOSIS — Z510 Encounter for antineoplastic radiation therapy: Secondary | ICD-10-CM | POA: Insufficient documentation

## 2021-07-24 DIAGNOSIS — C3490 Malignant neoplasm of unspecified part of unspecified bronchus or lung: Secondary | ICD-10-CM | POA: Insufficient documentation

## 2021-07-24 LAB — T4, FREE: Free T4: 1.05 ng/dL (ref 0.61–1.12)

## 2021-07-24 LAB — CBC
HCT: 29.3 % — ABNORMAL LOW (ref 39.0–52.0)
Hemoglobin: 9.6 g/dL — ABNORMAL LOW (ref 13.0–17.0)
MCH: 28.2 pg (ref 26.0–34.0)
MCHC: 32.8 g/dL (ref 30.0–36.0)
MCV: 85.9 fL (ref 80.0–100.0)
Platelets: 307 10*3/uL (ref 150–400)
RBC: 3.41 MIL/uL — ABNORMAL LOW (ref 4.22–5.81)
RDW: 15.1 % (ref 11.5–15.5)
WBC: 9.3 10*3/uL (ref 4.0–10.5)
nRBC: 0 % (ref 0.0–0.2)

## 2021-07-24 LAB — GLUCOSE, CAPILLARY
Glucose-Capillary: 142 mg/dL — ABNORMAL HIGH (ref 70–99)
Glucose-Capillary: 112 mg/dL — ABNORMAL HIGH (ref 70–99)

## 2021-07-24 LAB — BASIC METABOLIC PANEL
Anion gap: 6 (ref 5–15)
BUN: 19 mg/dL (ref 8–23)
CO2: 26 mmol/L (ref 22–32)
Calcium: 9.4 mg/dL (ref 8.9–10.3)
Chloride: 102 mmol/L (ref 98–111)
Creatinine, Ser: 0.63 mg/dL (ref 0.61–1.24)
GFR, Estimated: >60 mL/min (ref >60)
Glucose, Bld: 178 mg/dL — ABNORMAL HIGH (ref 70–99)
Potassium: 5.1 mmol/L (ref 3.5–5.1)
Sodium: 134 mmol/L — ABNORMAL LOW (ref 135–145)

## 2021-07-24 MED ORDER — FENTANYL CITRATE (PF) 100 MCG/2ML IJ SOLN
INTRAMUSCULAR | Status: AC
  Filled 2021-07-24: qty 2

## 2021-07-24 MED ORDER — DEXAMETHASONE 2 MG PO TABS: ORAL_TABLET | ORAL | 0 refills | Status: AC

## 2021-07-24 NOTE — Progress Notes (Signed)
Patient clinically stable post LUL CT lung biopsy per Dr Maryelizabeth Kaufmann, vitals stable pre and post procedure. Only Local used for procedure secondarily to HR, although patient did well. Awake/alert and oriented post procedure. Report given to [facility] RN post procedure with questions answered. Denies complaints at present time. Noted will remain NPO until repeat CXR @ 1030 with nurse at bedside/120 aware.

## 2021-07-24 NOTE — Discharge Summary (Signed)
Physician Discharge Summary   Patient: Evan Gonzalez MRN: [id number]  DOB: 05/17/1940   Admit:     Date of Admission: 07/19/2021 Admitted from: home   Discharge: Date of discharge: 07/24/21 Disposition: Home health Condition at discharge: good/fair  CODE STATUS: FULL   Diet recommendation: Cardiac diet   Discharge Physician: Emeterio Reeve, DO [facility]     PCP: Pcp, No  Recommendations for Outpatient Follow-up:  Follow up with PCP Pcp, No in 1-2 weeks  Please obtain labs/tests: TSH/T4, CBC, CMP, Magnesium in 1-2 weeks. Home health can also perform these and forward to PCP if that provider accepts orders Please follow up on the following pending results: Oncology to follow lung biopsy results Pt to follow as directed w/ oncology  Printed for patient on AVS:   "HOLD your timolol eye drops for now, these may be contributing to your low heart rate. More concerning with the low  heart rate is possible effects from brain metastases. If you experience severe dizziness, lightheadedness, weakness, please seek emergency medical care!  NEW MEDICINES: steroids to reduce effects of brain metastases. Please continue these medications as prescribed until follow up with oncology, then they will decide to continue or change/stop this medication."   Brief hospital course: ZEBBIE ACE is a 81 y.o. male with medical history significant of essential hypertension, type 2 diabetes, prostate cancer with bone mets who presents to the hospital with a fall.  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 CT abdomen/pelvis showed a large right lung mass, multiple liver metastasis.  MRI of the brain showed multiple hemorrhagic metastatic lesions. Lung biopsy completed. Pt did develop asymptomatic sinus bradycardia, held timolol drops though more concern for brain mets being cause of bradycardia, cardiology saw patient, Echo no concerns, EKG sinus    Consultants: Cardiology Radiation Oncology Pulmonology Oncology  Procedures: 07/24/21 CT guided biopsy L lung mass w/ IR     Discharge Diagnoses: Principal Problem:   Metastatic cancer to brain [facility]) Active Problems:   Essential hypertension   Pulmonary hypertension ([place])   Prostate cancer metastatic to bone ([place])   Hyponatremia   Obesity (BMI 35.0-39.9 without comorbidity)   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 Fall at home due to weakness. PET scan did not show any bone lesion. Patient is scheduled for CT-guided lung biopsy 06/07 which had to be delayed d/t bradycardia, cardiology consult placed, agree will hold timolol for now, ok to proceed w/ biopsy from their standpoint and this procedure was done 06/08 Continue Decadron for brain mets, transition to po on discharge  Oncology follow up in place  Brain mets may also be contributing to bradycardia   Bradycardia Sinus bradycardia, asymptomatic  Cardiology consulted, see full note Will hold timolol eye drops for now TSH not indicating hypothyroid, is actually low, normal T4, will need to f/u outpatient to repeat  Brain mets may also be contributing to bradycardia   Hyponatremia. Secondary to lung cancer, stable.  Essential hypertension. Continue amlodipine  Type 2 diabetes. Resume home meds on discharge   Anemia of chronic disease. B12 borderline, homocystine level normal.  Patient does not have a B12 deficiency   Positive blood culture. 2/4 bottles positive for gram-negative rods, discussed with micro lab, consistent with corynebacteria.  Clinically, there is no source of infection.  Likely contaminants  Discharge Instructions  Discharge Instructions     Diet - low sodium heart healthy   Complete by: As directed    Discharge instructions   Complete  by: As directed    HOLD your timolol eye drops for now, these may be contributing to your low heart rate. More concerning with the low  heart rate is possible effects from brain metastases. If you experience severe dizziness, lightheadedness, weakness, please seek emergency medical care!  NEW MEDICINES: steroids to reduce effects of brain metastases. Please continue these medications as prescribed until follow up with oncology, then they will decide to continue or change/stop this medication.   Discharge wound care:   Complete by: As directed    Monitor biopsy site for bleeding. Keep clean and dry. Change bandages 1-2 times per day / as needed if soiled. Please seek medical care if concern for significant bleeding.   Increase activity slowly   Complete by: As directed    Walker rolling   Complete by: As directed        Allergies as of 07/24/2021   No Known Allergies      Medication List     STOP taking these medications    fluticasone 50 MCG/ACT nasal spray Commonly known as: FLONASE   lisinopril-hydrochlorothiazide 10-12.5 MG tablet Commonly known as: ZESTORETIC   meclizine 25 MG tablet Commonly known as: ANTIVERT   ondansetron 4 MG disintegrating tablet Commonly known as: Zofran ODT   timolol 0.25 % ophthalmic solution Commonly known as: TIMOPTIC       TAKE these medications    acetaminophen 650 MG CR tablet Commonly known as: TYLENOL Take 650 mg by mouth 2 (two) times daily.   amLODipine 5 MG tablet Commonly known as: NORVASC Take 5 mg by mouth daily.   cetirizine 10 MG tablet Commonly known as: ZYRTEC Take 10 mg by mouth daily.   dexamethasone 2 MG tablet Commonly known as: DECADRON Take 1.5 tablets (3 mg total) by mouth 3 (three) times daily for 3 days, THEN 1 tablet (2 mg total) 3 (three) times daily for 3 days, THEN 1 tablet (2 mg total) 2 (two) times daily for 24 days. To continue dose / change dose per oncology recommendations. Start taking on: July 24, 2021   dipyridamole-aspirin 200-25 MG 12hr capsule Commonly known as: AGGRENOX Take 1 capsule by mouth 2 (two) times daily.   dorzolamide 2 % ophthalmic solution Commonly known as: TRUSOPT Place 1 drop into both eyes 2 (two) times daily.   latanoprost 0.005 % ophthalmic solution Commonly known as: XALATAN Place 1 drop into both eyes at bedtime.   metFORMIN 500 MG tablet Commonly known as: GLUCOPHAGE Take 500 mg by mouth 2 (two) times daily with a meal.   NONFORMULARY OR COMPOUNDED ITEM See pharmacy note   simvastatin 20 MG tablet Commonly known as: ZOCOR Take 20 mg by mouth at bedtime.   traZODone 50 MG tablet Commonly known as: DESYREL Take 50 mg by mouth at bedtime.               Durable Medical Equipment  (From admission, onward)           Start     Ordered   07/24/21 1210  For home use only DME Walker rolling  Once       Question Answer Comment  Walker: With 5 Inch Wheels   Patient needs a walker to treat with the following condition Difficulty walking      07/24/21  1209              Discharge Care Instructions  (From admission, onward)           Start     Ordered   07/24/21 0000  Discharge wound care:       Comments: Monitor biopsy site for bleeding. Keep clean and dry. Change bandages 1-2 times per day / as needed if soiled. Please seek medical care if concern for significant bleeding.   07/24/21 1220            Diet Orders (From admission, onward)     Start     Ordered   07/24/21 1205  Diet Heart Room service appropriate? Yes; Fluid consistency: Thin  Diet effective now       Question Answer Comment  Room service appropriate? Yes   Fluid consistency: Thin      07/24/21 1205   07/24/21 0000  Diet - low sodium heart healthy        07/24/21 1220               No Known Allergies   Subjective: patient feeling well after biopsy, wants to eat something and go home! No CP/SOB, no new headache or vision  changes   Discharge Exam: Vitals:   07/24/21 1100 07/24/21 1151  BP: 113/68 124/71  Pulse: (!) 47 (!) 43  Resp: 18 18  Temp: 98.7 F (37.1 C) 97.6 F (36.4 C)  SpO2: 100% 100%   Vitals:   07/24/21 0924 07/24/21 0933 07/24/21 1100 07/24/21 1151  BP: 104/64 102/66 113/68 124/71  Pulse: (!) 46 (!) 48 (!) 47 (!) 43  Resp: 16 16 18 18   Temp:   98.7 F (37.1 C) 97.6 F (36.4 C)  TempSrc:   Oral   SpO2: 98% 98% 100% 100%  Weight:      Height:       General: Pt is alert, awake, not in acute distress Cardiovascular: RRR, S1/S2 +, no rubs, no gallops Respiratory: CTA bilaterally, no wheezing, no rhonchi Abdominal: Soft, NT, ND, bowel sounds + Extremities: no edema, no cyanosis     The results of significant diagnostics from this hospitalization (including imaging, microbiology, ancillary and laboratory) are listed below for reference.     Microbiology: Recent Results (from the past 240 hour(s))  Blood Culture (routine x 2)     Status: None (Preliminary result)   Collection Time: 07/19/21  4:15 PM   Specimen: BLOOD  Result Value Ref Range Status   Specimen Description   Final    BLOOD RIGHT ANTECUBITAL Performed at [facility], [address], [place], [place] [address]    Special Requests   Final    BOTTLES DRAWN AEROBIC AND ANAEROBIC Blood Culture results may not be optimal due to an inadequate volume of blood received in culture bottles Performed at [facility], [address] [place], [place] [address]    Culture  Setup Time   Final    GRAM POSITIVE RODS AEROBIC BOTTLE ONLY CRITICAL RESULT CALLED TO, READ BACK BY AND VERIFIED WITHLloyd Huger @ 0272 07/21/21 LFD Performed at [facility], [address] [place], [place] [address]    Culture   Final    CULTURE REINCUBATED FOR BETTER GROWTH Performed at [facility], [place] [address] [place], [place] [address]    Report Status PENDING  Incomplete  Blood Culture (routine  x 2)     Status: None (Preliminary result)  Collection Time: 07/19/21  4:20 PM   Specimen: BLOOD  Result Value Ref Range Status   Specimen Description   Final    BLOOD LEFT ANTECUBITAL Performed at [facility], [address] [place], [place] [address]    Special Requests   Final    BOTTLES DRAWN AEROBIC AND ANAEROBIC Blood Culture results may not be optimal due to an inadequate volume of blood received in culture bottles Performed at [facility], [address] [place], [place] [address]    Culture  Setup Time   Final    GRAM POSITIVE RODS AEROBIC BOTTLE ONLY CRITICAL VALUE NOTED.  VALUE IS CONSISTENT WITH PREVIOUSLY REPORTED AND CALLED VALUE. GRAM STAIN REVIEWED-AGREE WITH RESULT    Culture   Final    GRAM POSITIVE RODS CULTURE REINCUBATED FOR BETTER GROWTH Performed at [facility], [place] [address] [place], [place] [address]    Report Status PENDING  Incomplete  Culture, blood (Routine X 2) w Reflex to ID Panel     Status: None (Preliminary result)   Collection Time: 07/22/21  2:49 PM   Specimen: BLOOD LEFT HAND  Result Value Ref Range Status   Specimen Description BLOOD LEFT HAND  Final   Special Requests   Final    BOTTLES DRAWN AEROBIC AND ANAEROBIC Blood Culture adequate volume   Culture   Final    NO GROWTH 2 DAYS Performed at [facility], [address] [place], [place] [address]    Report Status PENDING  Incomplete  Culture, blood (Routine X 2) w Reflex to ID Panel     Status: None (Preliminary result)   Collection Time: 07/22/21  5:11 PM   Specimen: BLOOD  Result Value Ref Range Status   Specimen Description BLOOD HAND  Final   Special Requests BOTTLES DRAWN AEROBIC AND ANAEROBIC BCAV  Final   Culture   Final    NO GROWTH 2 DAYS Performed at [facility], [address], [place], [place] [address]    Report Status PENDING  Incomplete     Labs: BNP (last 3 results) No results for input(s): "BNP" in the  last 8760 hours. Basic Metabolic Panel: Recent Labs  Lab 07/19/21 1615 07/21/21 0451 07/23/21 1246 07/24/21 0559  NA 134* 132* 133* 134*  K 4.6 4.6 4.6 5.1  CL 102 102 101 102  CO2 23 22 26 26   GLUCOSE 114* 163* 161* 178*  BUN 26* 24* 18 19  CREATININE 1.20 0.75 0.63 0.63  CALCIUM 8.9 9.3 9.5 9.4  MG  --   --  1.7  --    Liver Function Tests: Recent Labs  Lab 07/19/21 1615  AST 27  ALT 15  ALKPHOS 29*  BILITOT 0.7  PROT 7.9  ALBUMIN 3.2*   No results for input(s): "LIPASE", "AMYLASE" in the last 168 hours. No results for input(s): "AMMONIA" in the last 168 hours. CBC: Recent Labs  Lab 07/19/21 1615 07/19/21 2016 07/21/21 0451 07/23/21 1246 07/24/21 0559  WBC 8.5 7.1 9.5 10.6* 9.3  NEUTROABS 5.3  --   --   --   --   HGB 9.1* 9.7* 8.8* 9.5* 9.6*  HCT 29.1* 30.5* 27.4* 29.9* 29.3*  MCV 88.7 86.2 85.6 87.2 85.9  PLT 315 310 292 301 307   Cardiac Enzymes: Recent Labs  Lab 07/19/21 1615  CKTOTAL 52   BNP: Invalid input(s): "POCBNP" CBG: Recent Labs  Lab 07/23/21 1144 07/23/21 1652 07/23/21 2014 07/24/21 0719 07/24/21 1158  GLUCAP 124* 177* 161* 142* 112*   D-Dimer No results for input(s): "DDIMER" in the last 72 hours. Hgb A1c No results for input(s): "HGBA1C" in the last 72 hours. Lipid Profile No results for input(s): "CHOL", "HDL", "LDLCALC", "TRIG", "CHOLHDL", "LDLDIRECT" in the last 72 hours. Thyroid function studies Recent Labs    07/23/21 1246  TSH 0.132*   Anemia work up No results for input(s): "VITAMINB12", "FOLATE", "FERRITIN", "TIBC", "IRON", "RETICCTPCT" in the last 72 hours. Urinalysis    Component Value Date/Time   COLORURINE YELLOW 07/12/2020 1605   APPEARANCEUR CLEAR 07/12/2020 1605   LABSPEC 1.025 07/12/2020 1605   PHURINE 5.5 07/12/2020 1605   GLUCOSEU NEGATIVE 07/12/2020 1605   HGBUR SMALL (A) 07/12/2020 1605   BILIRUBINUR SMALL (A) 07/12/2020 1605   KETONESUR TRACE (A) 07/12/2020 1605   PROTEINUR NEGATIVE  07/12/2020 1605   NITRITE NEGATIVE 07/12/2020 1605   LEUKOCYTESUR NEGATIVE 07/12/2020 1605   Sepsis Labs Recent Labs  Lab 07/19/21 2016 07/21/21 0451 07/23/21 1246 07/24/21 0559  WBC 7.1 9.5 10.6* 9.3   Microbiology Recent Results (from the past 240 hour(s))  Blood Culture (routine x 2)     Status: None (Preliminary result)   Collection Time: 07/19/21  4:15 PM   Specimen: BLOOD  Result Value Ref Range Status   Specimen Description   Final    BLOOD RIGHT ANTECUBITAL Performed at [facility], [address] [place], [place] [address]    Special Requests   Final    BOTTLES DRAWN AEROBIC AND ANAEROBIC Blood Culture results may not be optimal due to an inadequate volume of blood received in culture bottles Performed at [facility], [address], [place], [place] [address]    Culture  Setup Time   Final    GRAM POSITIVE RODS AEROBIC BOTTLE ONLY CRITICAL RESULT CALLED TO, READ BACK BY AND VERIFIED WITHLloyd Huger @ 6160 07/21/21 LFD Performed at [facility], [address] [place], [place] [address]    Culture   Final    CULTURE REINCUBATED FOR BETTER GROWTH Performed at [facility], [place] [address] [place], [place] [address]    Report Status PENDING  Incomplete  Blood Culture (routine x 2)     Status: None (Preliminary result)   Collection Time: 07/19/21  4:20 PM   Specimen: BLOOD  Result Value Ref Range Status   Specimen Description   Final    BLOOD LEFT ANTECUBITAL Performed at [facility], [address] [place], [place] [address]    Special Requests   Final    BOTTLES DRAWN AEROBIC AND ANAEROBIC Blood Culture results may not be optimal due to an inadequate volume of blood received in culture bottles Performed at [facility], [address], [place],  [address]    Culture  Setup Time   Final    GRAM POSITIVE RODS AEROBIC BOTTLE ONLY CRITICAL VALUE NOTED.  VALUE IS CONSISTENT WITH PREVIOUSLY  REPORTED AND CALLED VALUE. GRAM STAIN REVIEWED-AGREE WITH RESULT    Culture   Final    GRAM POSITIVE RODS CULTURE REINCUBATED FOR BETTER GROWTH Performed at [facility], [place] [address], [place],  [address]    Report Status PENDING  Incomplete  Culture, blood (Routine X 2) w Reflex to ID Panel     Status: None (Preliminary result)   Collection Time: 07/22/21  2:49 PM   Specimen: BLOOD LEFT HAND  Result Value Ref Range Status   Specimen Description BLOOD  LEFT HAND  Final   Special Requests   Final    BOTTLES DRAWN AEROBIC AND ANAEROBIC Blood Culture adequate volume   Culture   Final    NO GROWTH 2 DAYS Performed at [facility], [address] [place], [place] [address]    Report Status PENDING  Incomplete  Culture, blood (Routine X 2) w Reflex to ID Panel     Status: None (Preliminary result)   Collection Time: 07/22/21  5:11 PM   Specimen: BLOOD  Result Value Ref Range Status   Specimen Description BLOOD HAND  Final   Special Requests BOTTLES DRAWN AEROBIC AND ANAEROBIC BCAV  Final   Culture   Final    NO GROWTH 2 DAYS Performed at [facility], [address], [place], [place] [address]    Report Status PENDING  Incomplete   Imaging DG Chest Port 1 View  Result Date: 07/24/2021 CLINICAL DATA:  Evaluate for pneumothorax following biopsy. EXAM: PORTABLE CHEST 1 VIEW COMPARISON:  Chest radiograph 07/24/2021 at 0935 hours FINDINGS: Again noted is the mass along the medial left chest. Negative for pneumothorax. Linear densities in the mid left lung are similar to the previous examination. No new airspace disease or consolidation. Heart and mediastinum are stable. The trachea is midline. IMPRESSION: 1. Negative for pneumothorax. 2. Stable appearance of the left lung mass. Electronically Signed   By: Markus Daft M.D.   On: 07/24/2021 12:07   CT LUNG MASS BIOPSY  Result Date: 07/24/2021 INDICATION: Lung mass. EXAM: CT-GUIDED LEFT LUNG MASS BIOPSY  RADIATION DOSE REDUCTION: This exam was performed according to the departmental dose-optimization program which includes automated exposure control, adjustment of the mA and/or kV according to patient size and/or use of iterative reconstruction technique. COMPARISON:  CT CAP, 07/19/2021. MEDICATIONS: None. ANESTHESIA/SEDATION: Local anesthetic was administered. The patient was continuously monitored during the procedure by the interventional radiology nurse under my direct supervision. CONTRAST:  None COMPLICATIONS: None immediate. PROCEDURE: Informed consent was obtained from the the patient and/or patient's representative following an explanation of the procedure, risks, benefits and alternatives. The patient understands,agrees and consents for the procedure. All questions were addressed. A time out was performed prior to the initiation of the procedure. The patient was positioned supine on the CT table and a limited chest CT was performed for procedural planning demonstrating large LEFT lung mass. The operative site was prepped and draped in the usual sterile fashion. Under sterile conditions and local anesthesia, a 17 gauge coaxial needle was advanced into the peripheral aspect of the nodule. Positioning was confirmed with intermittent CT fluoroscopy and followed by the acquisition of 3 core biopsy with an 18 gauge device. The coaxial needle was removed following deployment of a Bios[employer] plug and superficial hemostasis was achieved with manual compression. Limited post procedural chest CT demonstrated a small volume pneumothorax. A dressing was placed. The patient tolerated the procedure well without immediate postprocedural complication. The patient was escorted to have an upright chest radiograph. IMPRESSION: 1. Successful CT guided core biopsy of LEFT lung mass, as above. 2. Small volume LEFT anterior postprocedure pneumothorax. PLAN: Patient remained vital signs and hemodynamically stable. Additional  immediate intervention was not required. An immediate upright portable chest XR was acquired, within additional 1 hour post XR for follow-up. Michaelle Birks, MD [facility] Electronically Signed   By: Michaelle Birks M.D.   On: 07/24/2021 09:59   DG Chest Port 1 View  Result Date: 07/24/2021 CLINICAL  DATA:  Status post lung biopsy EXAM: PORTABLE CHEST 1 VIEW COMPARISON:  07/19/2021 FINDINGS: Large left upper lobe pulmonary mass again noted. No definite pneumothorax. Bilateral mild interstitial thickening. Heart and mediastinal contours are unremarkable. No acute osseous abnormality. IMPRESSION: 1. Left upper lobe pulmonary mass.  No pneumothorax. Electronically Signed   By: Kathreen Devoid M.D.   On: 07/24/2021 09:49   ECHOCARDIOGRAM COMPLETE  Result Date: 07/23/2021    ECHOCARDIOGRAM REPORT   Patient Name:   KEYMON MCELROY Date of Exam: 07/23/2021 Medical Rec #:  [id number]          Height:       71.0 in Accession #:    [id number]         Weight:       257.9 lb Date of Birth:  08/06/1940         BSA:          2.349 m Patient Age:    81 years           BP:           120/61 mmHg Patient Gender: M                  HR:           42 bpm. Exam Location:  [facility] Procedure: 2D Echo, Cardiac Doppler and Color Doppler Indications:     Abnormal ECG R64.31  History:         Patient has no prior history of Echocardiogram examinations.                  Risk Factors:Hypertension, Diabetes and Dyslipidemia.  Sonographer:     Sherrie Sport Referring Phys:  [id number] Kate Sable Diagnosing Phys: Kate Sable MD  Sonographer Comments: Technically challenging study due to limited acoustic windows, no parasternal window, suboptimal apical window and no subcostal window. IMPRESSIONS  1. Left ventricular ejection fraction, by estimation, is 55 to 60%. The left ventricle has normal function. The left ventricle has no regional wall motion abnormalities. Left ventricular  diastolic parameters are consistent with Grade I diastolic dysfunction (impaired relaxation).  2. Right ventricular systolic function was not well visualized. The right ventricular size is not well visualized.  3. The mitral valve is grossly normal. No evidence of mitral valve regurgitation.  4. The aortic valve was not well visualized. Aortic valve regurgitation is not visualized. FINDINGS  Left Ventricle: Left ventricular ejection fraction, by estimation, is 55 to 60%. The left ventricle has normal function. The left ventricle has no regional wall motion abnormalities. The left ventricular internal cavity size was normal in size. There is  no left ventricular hypertrophy. Left ventricular diastolic parameters are consistent with Grade I diastolic dysfunction (impaired relaxation). Right Ventricle: The right ventricular size is not well visualized. Right vetricular wall thickness was not well visualized. Right ventricular systolic function was not well visualized. Left Atrium: Left atrial size was normal in size. Right Atrium: Right atrial size was not well visualized. Pericardium: There is no evidence of pericardial effusion. Mitral Valve: The mitral valve is grossly normal. No evidence of mitral valve regurgitation. MV peak gradient, 2.5 mmHg. The mean mitral valve gradient is 1.0 mmHg. Tricuspid Valve: The tricuspid valve is not well visualized. Tricuspid valve regurgitation is not demonstrated. Aortic Valve: The aortic valve was not well visualized. Aortic valve regurgitation is not visualized. Aortic valve mean gradient measures 2.0 mmHg. Aortic valve peak gradient measures 3.3 mmHg. Aortic valve area, by VTI  measures 2.78 cm. Pulmonic Valve: The pulmonic valve was not well visualized. Pulmonic valve regurgitation is not visualized. Aorta: The aortic root was not well visualized. Venous: The inferior vena cava was not well visualized. IAS/Shunts: No atrial level shunt detected by color flow Doppler.  LEFT  VENTRICLE PLAX 2D LVIDd:         4.00 cm   Diastology LVIDs:         2.40 cm   LV e' medial:    5.33 cm/s LV PW:         1.20 cm   LV E/e' medial:  10.7 LV IVS:        1.00 cm   LV e' lateral:   6.31 cm/s LVOT diam:     2.00 cm   LV E/e' lateral: 9.0 LV SV:         38 LV SV Index:   16 LVOT Area:     3.14 cm  LEFT ATRIUM             Index        RIGHT ATRIUM           Index LA diam:        2.70 cm 1.15 cm/m   RA Area:     15.50 cm LA Vol (A2C):   36.0 ml 15.32 ml/m  RA Volume:   33.80 ml  14.39 ml/m LA Vol (A4C):   63.4 ml 26.99 ml/m LA Biplane Vol: 49.1 ml 20.90 ml/m  AORTIC VALVE AV Area (Vmax):    2.54 cm AV Area (Vmean):   2.85 cm AV Area (VTI):     2.78 cm AV Vmax:           90.40 cm/s AV Vmean:          55.100 cm/s AV VTI:            0.138 m AV Peak Grad:      3.3 mmHg AV Mean Grad:      2.0 mmHg LVOT Vmax:         73.10 cm/s LVOT Vmean:        49.900 cm/s LVOT VTI:          0.122 m LVOT/AV VTI ratio: 0.88  AORTA Ao Root diam: 3.60 cm MITRAL VALVE MV Area (PHT): 4.06 cm    SHUNTS MV Area VTI:   2.44 cm    Systemic VTI:  0.12 m MV Peak grad:  2.5 mmHg    Systemic Diam: 2.00 cm MV Mean grad:  1.0 mmHg MV Vmax:       0.79 m/s MV Vmean:      49.8 cm/s MV Decel Time: 187 msec MV E velocity: 57.00 cm/s MV A velocity: 78.80 cm/s MV E/A ratio:  0.72 Kate Sable MD Electronically signed by Kate Sable MD Signature Date/Time: 07/23/2021/4:38:41 PM    Final       Time coordinating discharge: Over 30 minutes  SIGNED:  Emeterio Reeve DO [facility]

## 2021-07-24 NOTE — TOC Progression Note (Addendum)
Transition of Care [facility]) - Progression Note    Patient Details  Name: Evan Gonzalez MRN: [id number] Date of Birth: 1940-10-20  Transition of [facility]) CM/SW [place], RN Phone Number: 07/24/2021, 2:23 PM  Clinical Narrative:   Attempted to obtain home health for patient and the following companies have declined:  [facility], [facility], [place], [place], [facility], [place] and [place] [facility].  All cited reasons of not being able to accept [facility], other than [facility], who was unable to provide speech therapy.  Care team aware. Rolling walker was delivered to patient room.  06/08 [facility] stated they are unable to cover patient due to location  patient also advised to call Medicaid to ask about transportation.  Expected Discharge Plan: [place] Barriers to Discharge: Continued Medical Work up  Expected Discharge Plan and Services Expected Discharge Plan: [place]   Discharge Planning Services: CM Consult Post Acute Care Choice: Home Health, Durable Medical Equipment (PT OT to evaluate further) Living arrangements for the past 2 months: Single Family Home Expected Discharge Date: 07/24/21                                     Social Determinants of Health (SDOH) Interventions    Readmission Risk Interventions     No data to display

## 2021-07-24 NOTE — Care Management Important Message (Signed)
Important Message  Patient Details  Name: Evan Gonzalez MRN: [id number] Date of Birth: April 17, 1940   Medicare Important Message Given:  Yes     Juliann Pulse A Taylr Meuth 07/24/2021, 12:56 PM

## 2021-07-24 NOTE — Progress Notes (Signed)
Pt A/Ox4 upon review of AVS. Family picking patient up. PIV removed. Patient got dressed on his own. No further needs.

## 2021-07-24 NOTE — Evaluation (Signed)
Physical Therapy Evaluation Patient Details Name: Evan Gonzalez MRN: [id number] DOB: August 03, 1940 Today's Date: 07/24/2021  History of Present Illness  Patient is a 81 year old male with medical history significant of essential hypertension, type 2 diabetes, prostate cancer with bone mets who presents to the hospital with a fall.  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  CT abdomen/pelvis showed a large right lung mass, multiple liver metastasis.  MRI of the brain showed multiple hemorrhagic metastatic lesions  Clinical Impression  Patient agreeable to PT evaluation. He is eager to go home. Spoke with the son earlier today who reports patient is independent but a little unsteady with ambulation at home.  Today, the patient is modified independent with bed mobility and transfers. His standing balance improved with unilateral UE support. Recommend to use rolling walker with ambulation for safety and fall prevention. Gait training performed with education on how to use rolling walker. Sp02 100% on room air with activity. The patient feels mildly weak overall compared to baseline. Recommend to continue PT to maximize independence and facilitate return to prior level of function. Anticipate patient can return home with PRN family assistance, rolling walker for ambulation, and HHPT.      Recommendations for follow up therapy are one component of a multi-disciplinary discharge planning process, led by the attending physician.  Recommendations may be updated based on patient status, additional functional criteria and insurance authorization.  Follow Up Recommendations Home health PT    Assistance Recommended at Discharge PRN  Patient can return home with the following  Help with stairs or ramp for entrance;Assist for transportation    Equipment Recommendations Rolling walker (2 wheels)  Recommendations for Other Services       Functional Status Assessment Patient has had a recent  decline in their functional status and demonstrates the ability to make significant improvements in function in a reasonable and predictable amount of time.     Precautions / Restrictions Precautions Precautions: Fall Restrictions Weight Bearing Restrictions: No      Mobility  Bed Mobility Overal bed mobility: Modified Independent             General bed mobility comments: supine to sitting, head of bed elevated but no use of bed rails    Transfers Overall transfer level: Modified independent Equipment used: Rolling walker (2 wheels)               General transfer comment: cues for safety. no physical assistance required    Ambulation/Gait Ambulation/Gait assistance: Supervision, Min guard Gait Distance (Feet): 35 Feet Assistive device: Rolling walker (2 wheels) Gait Pattern/deviations: Step-through pattern, Wide base of support Gait velocity: decreased     General Gait Details: recommned to use rolling walker based on assessment of balance in standing. patient educated on proper use of rolling walker and importance of using for fall prevention. verbal cues for standing closer to rolling walker and negotiating turns. Sp02 100% on room air and heart rate 60's with activity  Stairs            Wheelchair Mobility    Modified Rankin (Stroke Patients Only)       Balance Overall balance assessment: Needs assistance Sitting-balance support: Feet supported Sitting balance-Leahy Scale: Normal     Standing balance support: Single extremity supported Standing balance-Leahy Scale: Fair Standing balance comment: patient reaching out for support in standing without UE support with fair standing balance with unilateral UE support. recommend to use rolling walker for safety with  ambulation/dynamic standing activity                             Pertinent Vitals/Pain Pain Assessment Pain Assessment: No/denies pain    Home Living Family/patient  expects to be discharged to:: Private residence Living Arrangements: Children Available Help at Discharge: Family Type of Home: House Home Access: Stairs to enter   Technical brewer of Steps: 2     Home Equipment: None Additional Comments: spoke with son to confirm prior level of function information    Prior Function Prior Level of Function : Independent/Modified Independent             Mobility Comments: independent but occasionally unsteady per son report ADLs Comments: independent     Hand Dominance        Extremity/Trunk Assessment   Upper Extremity Assessment Upper Extremity Assessment: Overall WFL for tasks assessed    Lower Extremity Assessment Lower Extremity Assessment: Generalized weakness (endurance impaired for sustained activity)       Communication   Communication: No difficulties  Cognition Arousal/Alertness: Awake/alert Behavior During Therapy: WFL for tasks assessed/performed Overall Cognitive Status: Within Functional Limits for tasks assessed                                 General Comments: patient able to follow all commands without difficulty        General Comments General comments (skin integrity, edema, etc.): patient is hopeful to discharge home today    Exercises     Assessment/Plan    PT Assessment Patient needs continued PT services  PT Problem List Decreased strength;Decreased activity tolerance;Decreased balance;Decreased mobility       PT Treatment Interventions DME instruction;Gait training;Stair training;Functional mobility training;Therapeutic activities;Therapeutic exercise;Balance training;Neuromuscular re-education;Patient/family education    PT Goals (Current goals can be found in the Care Plan section)  Acute Rehab PT Goals Patient Stated Goal: patient's goal is to return home. son's goal is to return home with HHPT and rolling walker. PT Goal Formulation: With patient/family Time For Goal  Achievement: 08/07/21 Potential to Achieve Goals: Good    Frequency Min 2X/week     Co-evaluation               AM-PAC PT "6 Clicks" Mobility  Outcome Measure Help needed turning from your back to your side while in a flat bed without using bedrails?: None Help needed moving from lying on your back to sitting on the side of a flat bed without using bedrails?: None Help needed moving to and from a bed to a chair (including a wheelchair)?: A Little Help needed standing up from a chair using your arms (e.g., wheelchair or bedside chair)?: A Little Help needed to walk in hospital room?: A Little Help needed climbing 3-5 steps with a railing? : A Little 6 Click Score: 20    End of Session   Activity Tolerance: Patient tolerated treatment well Patient left: in bed;with call bell/phone within reach;with nursing/sitter in room;with bed alarm set (he preferred to remain sitting on the side of bed. nurse aide present in room) Nurse Communication: Mobility status PT Visit Diagnosis: Muscle weakness (generalized) (M62.81);Unsteadiness on feet (R26.81)    Time: 1130-1147 PT Time Calculation (min) (ACUTE ONLY): 17 min   Charges:   PT Evaluation $PT Eval Low Complexity: 1 Low PT Treatments $Gait Training: 8-22 mins  Minna Merritts, PT, MPT   Percell Locus 07/24/2021, 12:03 PM

## 2021-07-24 NOTE — Procedures (Signed)
Vascular and Interventional Radiology Procedure Note  Patient: NAVDEEP HALT DOB: 1940-11-12 Medical Record Number: [id number] Note Date/Time: 07/24/21 9:30 AM   Performing Physician: Michaelle Birks, MD Assistant(s): None  Diagnosis: Lung mass  Procedure: LEFT LUNG MASS BIOPSY  Anesthesia: Conscious Sedation Complications: *A small post procedural PTX was present* Estimated Blood Loss: Minimal Specimens: Sent for Pathology  Findings:  Successful CT-guided biopsy of LEFT lung mass. A total of 3 samples were obtained. Hemostasis of the tract was achieved using  [employer] plug .  Plan: CXR immediate and 1 Hr post. NPO and Bed rest for 1 hours.  See detailed procedure note with images in PACS. The patient tolerated the procedure well without incident or complication and was returned to Floor Bed in stable condition.    Michaelle Birks, MD [facility] Radiology   Pager. [phone]

## 2021-07-24 NOTE — Progress Notes (Addendum)
PT Cancellation Note  Patient Details Name: HAL NORRINGTON MRN: [id number] DOB: 12/01/1940   Cancelled Treatment:    Reason Eval/Treat Not Completed: Patient at procedure or test/unavailable. PT will follow up later when patient is back to room. Spoke to the son at the bedside who was asking if patient was going to be discharge today. The son is also requesting a rolling walker and home health PT at discharge.  Minna Merritts, PT, MPT  Percell Locus 07/24/2021, 8:35 AM

## 2021-07-25 ENCOUNTER — Other Ambulatory Visit: Payer: Self-pay | Admitting: *Deleted

## 2021-07-25 DIAGNOSIS — C7951 Secondary malignant neoplasm of bone: Secondary | ICD-10-CM | POA: Diagnosis not present

## 2021-07-25 DIAGNOSIS — Z510 Encounter for antineoplastic radiation therapy: Secondary | ICD-10-CM | POA: Diagnosis not present

## 2021-07-25 DIAGNOSIS — C61 Malignant neoplasm of prostate: Secondary | ICD-10-CM | POA: Diagnosis not present

## 2021-07-25 DIAGNOSIS — C3490 Malignant neoplasm of unspecified part of unspecified bronchus or lung: Secondary | ICD-10-CM | POA: Diagnosis not present

## 2021-07-25 DIAGNOSIS — Z87891 Personal history of nicotine dependence: Secondary | ICD-10-CM | POA: Diagnosis not present

## 2021-07-25 DIAGNOSIS — C3492 Malignant neoplasm of unspecified part of left bronchus or lung: Secondary | ICD-10-CM

## 2021-07-25 DIAGNOSIS — C7931 Secondary malignant neoplasm of brain: Secondary | ICD-10-CM | POA: Diagnosis present

## 2021-07-25 LAB — CULTURE, BLOOD (ROUTINE X 2)

## 2021-07-27 LAB — CULTURE, BLOOD (ROUTINE X 2)
Culture: NO GROWTH
Culture: NO GROWTH
Special Requests: ADEQUATE

## 2021-07-29 ENCOUNTER — Ambulatory Visit: Admission: RE | Admit: 2021-07-29 | Payer: Medicare Other | Source: Ambulatory Visit

## 2021-07-29 DIAGNOSIS — Z510 Encounter for antineoplastic radiation therapy: Secondary | ICD-10-CM | POA: Diagnosis not present

## 2021-07-29 LAB — SURGICAL PATHOLOGY

## 2021-07-29 NOTE — Progress Notes (Deleted)
[facility]  [phone] [phone]  ID: Evan Gonzalez OB: 1941/01/12  MR#: [id number]  JQB#:[id number]  Patient Care Team: Pcp, No as PCP - General Grayland Ormond Kathlene November, MD as Consulting Physician (Hematology and Oncology) Telford Nab, RN as Oncology Nurse Navigator  CHIEF COMPLAINT: Stage IVB prostate cancer.  INTERVAL HISTORY: Patient returns to clinic today for repeat laboratory work, routine 81-month evaluation, and continuation of Eligard.  He continues to feel well and remains asymptomatic. He denies any pain. He has no neurologic complaints.  He denies any recent fevers or illnesses.  He has a good appetite and denies weight loss.  He has no chest pain, shortness of breath, cough, or hemoptysis.  He denies any nausea, vomiting, constipation or diarrhea.  He has no urinary complaints.  Patient feels at his baseline and offers no specific complaints today.  REVIEW OF SYSTEMS:   Review of Systems  Constitutional: Negative.  Negative for fever and malaise/fatigue.  Respiratory: Negative.  Negative for cough and shortness of breath.   Cardiovascular: Negative.  Negative for chest pain and leg swelling.  Gastrointestinal: Negative.  Negative for abdominal pain and diarrhea.  Genitourinary: Negative.  Negative for flank pain.  Musculoskeletal: Negative.  Negative for back pain.  Skin: Negative.  Negative for rash.  Neurological: Negative.  Negative for dizziness, focal weakness, weakness and headaches.  Psychiatric/Behavioral: Negative.  The patient is not nervous/anxious.     As per HPI. Otherwise, a complete review of systems is negative.  PAST MEDICAL HISTORY: Past Medical History:  Diagnosis Date   Diabetes mellitus without complication (HCC)    Glaucoma    Hyperlipidemia    Hypertension     PAST SURGICAL HISTORY: Past Surgical History:  Procedure Laterality Date   EYE SURGERY     IR BONE TUMOR(S)RF ABLATION  03/28/2019   IR  KYPHO THORACIC WITH BONE BIOPSY  03/28/2019   IR RADIOLOGIST EVAL & MGMT  03/21/2019   IR RADIOLOGIST EVAL & MGMT  04/11/2019    FAMILY HISTORY: Family History  Problem Relation Age of Onset   Heart attack Mother    Diabetes Father     ADVANCED DIRECTIVES (Y/N):  N  HEALTH MAINTENANCE: Social History   Tobacco Use   Smoking status: Former    Types: Cigarettes    Quit date: 02/08/1987    Years since quitting: 34.4   Smokeless tobacco: Never  Vaping Use   Vaping Use: Never used  Substance Use Topics   Alcohol use: No   Drug use: No     Colonoscopy:  PAP:  Bone density:  Lipid panel:  No Known Allergies  Current Outpatient Medications  Medication Sig Dispense Refill   acetaminophen (TYLENOL) 650 MG CR tablet Take 650 mg by mouth 2 (two) times daily.     amLODipine (NORVASC) 5 MG tablet Take 5 mg by mouth daily.     cetirizine (ZYRTEC) 10 MG tablet Take 10 mg by mouth daily.     dexamethasone (DECADRON) 2 MG tablet Take 1.5 tablets (3 mg total) by mouth 3 (three) times daily for 3 days, THEN 1 tablet (2 mg total) 3 (three) times daily for 3 days, THEN 1 tablet (2 mg total) 2 (two) times daily for 24 days. To continue dose / change dose per oncology recommendations. 70.5 tablet 0   dipyridamole-aspirin (AGGRENOX) 200-25 MG 12hr capsule Take 1 capsule by mouth 2 (two) times daily.     dorzolamide (TRUSOPT) 2 % ophthalmic solution Place  1 drop into both eyes 2 (two) times daily.     latanoprost (XALATAN) 0.005 % ophthalmic solution Place 1 drop into both eyes at bedtime.     metFORMIN (GLUCOPHAGE) 500 MG tablet Take 500 mg by mouth 2 (two) times daily with a meal.     NONFORMULARY OR COMPOUNDED ITEM See pharmacy note 120 each 2   simvastatin (ZOCOR) 20 MG tablet Take 20 mg by mouth at bedtime.     traZODone (DESYREL) 50 MG tablet Take 50 mg by mouth at bedtime.     No current facility-administered medications for this visit.    OBJECTIVE: There were no vitals filed for this  visit.    There is no height or weight on file to calculate BMI.    ECOG FS:0 - Asymptomatic  General: Well-developed, well-nourished, no acute distress. Eyes: Pink conjunctiva, anicteric sclera. HEENT: Normocephalic, moist mucous membranes. Lungs: No audible wheezing or coughing. Heart: Regular rate and rhythm. Abdomen: Soft, nontender, no obvious distention. Musculoskeletal: No edema, cyanosis, or clubbing. Neuro: Alert, answering all questions appropriately. Cranial nerves grossly intact. Skin: No rashes or petechiae noted. Psych: Normal affect.  LAB RESULTS:  Lab Results  Component Value Date   NA 134 (L) 07/24/2021   K 5.1 07/24/2021   CL 102 07/24/2021   CO2 26 07/24/2021   GLUCOSE 178 (H) 07/24/2021   BUN 19 07/24/2021   CREATININE 0.63 07/24/2021   CALCIUM 9.4 07/24/2021   PROT 7.9 07/19/2021   ALBUMIN 3.2 (L) 07/19/2021   AST 27 07/19/2021   ALT 15 07/19/2021   ALKPHOS 29 (L) 07/19/2021   BILITOT 0.7 07/19/2021   GFRNONAA >60 07/24/2021   GFRAA >60 10/10/2019    Lab Results  Component Value Date   WBC 9.3 07/24/2021   NEUTROABS 5.3 07/19/2021   HGB 9.6 (L) 07/24/2021   HCT 29.3 (L) 07/24/2021   MCV 85.9 07/24/2021   PLT 307 07/24/2021     STUDIES: DG Chest Port 1 View  Result Date: 07/24/2021 CLINICAL DATA:  Evaluate for pneumothorax following biopsy. EXAM: PORTABLE CHEST 1 VIEW COMPARISON:  Chest radiograph 07/24/2021 at 0935 hours FINDINGS: Again noted is the mass along the medial left chest. Negative for pneumothorax. Linear densities in the mid left lung are similar to the previous examination. No new airspace disease or consolidation. Heart and mediastinum are stable. The trachea is midline. IMPRESSION: 1. Negative for pneumothorax. 2. Stable appearance of the left lung mass. Electronically Signed   By: Markus Daft M.D.   On: 07/24/2021 12:07   CT LUNG MASS BIOPSY  Result Date: 07/24/2021 INDICATION: Lung mass. EXAM: CT-GUIDED LEFT LUNG MASS BIOPSY  RADIATION DOSE REDUCTION: This exam was performed according to the departmental dose-optimization program which includes automated exposure control, adjustment of the mA and/or kV according to patient size and/or use of iterative reconstruction technique. COMPARISON:  CT CAP, 07/19/2021. MEDICATIONS: None. ANESTHESIA/SEDATION: Local anesthetic was administered. The patient was continuously monitored during the procedure by the interventional radiology nurse under my direct supervision. CONTRAST:  None COMPLICATIONS: None immediate. PROCEDURE: Informed consent was obtained from the the patient and/or patient's representative following an explanation of the procedure, risks, benefits and alternatives. The patient understands,agrees and consents for the procedure. All questions were addressed. A time out was performed prior to the initiation of the procedure. The patient was positioned supine on the CT table and a limited chest CT was performed for procedural planning demonstrating large LEFT lung mass. The operative site was prepped and  draped in the usual sterile fashion. Under sterile conditions and local anesthesia, a 17 gauge coaxial needle was advanced into the peripheral aspect of the nodule. Positioning was confirmed with intermittent CT fluoroscopy and followed by the acquisition of 3 core biopsy with an 18 gauge device. The coaxial needle was removed following deployment of a Biosentry plug and superficial hemostasis was achieved with manual compression. Limited post procedural chest CT demonstrated a small volume pneumothorax. A dressing was placed. The patient tolerated the procedure well without immediate postprocedural complication. The patient was escorted to have an upright chest radiograph. IMPRESSION: 1. Successful CT guided core biopsy of LEFT lung mass, as above. 2. Small volume LEFT anterior postprocedure pneumothorax. PLAN: Patient remained vital signs and hemodynamically stable. Additional  immediate intervention was not required. An immediate upright portable chest XR was acquired, within additional 1 hour post XR for follow-up. Michaelle Birks, MD [facility] Radiology Electronically Signed   By: Michaelle Birks M.D.   On: 07/24/2021 09:59   DG Chest Port 1 View  Result Date: 07/24/2021 CLINICAL DATA:  Status post lung biopsy EXAM: PORTABLE CHEST 1 VIEW COMPARISON:  07/19/2021 FINDINGS: Large left upper lobe pulmonary mass again noted. No definite pneumothorax. Bilateral mild interstitial thickening. Heart and mediastinal contours are unremarkable. No acute osseous abnormality. IMPRESSION: 1. Left upper lobe pulmonary mass.  No pneumothorax. Electronically Signed   By: Kathreen Devoid M.D.   On: 07/24/2021 09:49   ECHOCARDIOGRAM COMPLETE  Result Date: 07/23/2021    ECHOCARDIOGRAM REPORT   Patient Name:   Evan Gonzalez Date of Exam: 07/23/2021 Medical Rec #:  [id number]          Height:       71.0 in Accession #:    [id number]         Weight:       257.9 lb Date of Birth:  [birth year]         BSA:          2.349 m Patient Age:    81 years           BP:           120/61 mmHg Patient Gender: M                  HR:           42 bpm. Exam Location:  [facility] Procedure: 2D Echo, Cardiac Doppler and Color Doppler Indications:     Abnormal ECG R64.31  History:         Patient has no prior history of Echocardiogram examinations.                  Risk Factors:Hypertension, Diabetes and Dyslipidemia.  Sonographer:     Sherrie Sport Referring Phys:  [id number] Kate Sable Diagnosing Phys: Kate Sable MD  Sonographer Comments: Technically challenging study due to limited acoustic windows, no parasternal window, suboptimal apical window and no subcostal window. IMPRESSIONS  1. Left ventricular ejection fraction, by estimation, is 55 to 60%. The left ventricle has normal function. The left ventricle has no regional wall motion abnormalities. Left ventricular  diastolic parameters are consistent with Grade I diastolic dysfunction (impaired relaxation).  2. Right ventricular systolic function was not well visualized. The right ventricular size is not well visualized.  3. The mitral valve is grossly normal. No evidence of mitral valve regurgitation.  4. The aortic valve was not well visualized. Aortic valve regurgitation is not visualized.  FINDINGS  Left Ventricle: Left ventricular ejection fraction, by estimation, is 55 to 60%. The left ventricle has normal function. The left ventricle has no regional wall motion abnormalities. The left ventricular internal cavity size was normal in size. There is  no left ventricular hypertrophy. Left ventricular diastolic parameters are consistent with Grade I diastolic dysfunction (impaired relaxation). Right Ventricle: The right ventricular size is not well visualized. Right vetricular wall thickness was not well visualized. Right ventricular systolic function was not well visualized. Left Atrium: Left atrial size was normal in size. Right Atrium: Right atrial size was not well visualized. Pericardium: There is no evidence of pericardial effusion. Mitral Valve: The mitral valve is grossly normal. No evidence of mitral valve regurgitation. MV peak gradient, 2.5 mmHg. The mean mitral valve gradient is 1.0 mmHg. Tricuspid Valve: The tricuspid valve is not well visualized. Tricuspid valve regurgitation is not demonstrated. Aortic Valve: The aortic valve was not well visualized. Aortic valve regurgitation is not visualized. Aortic valve mean gradient measures 2.0 mmHg. Aortic valve peak gradient measures 3.3 mmHg. Aortic valve area, by VTI measures 2.78 cm. Pulmonic Valve: The pulmonic valve was not well visualized. Pulmonic valve regurgitation is not visualized. Aorta: The aortic root was not well visualized. Venous: The inferior vena cava was not well visualized. IAS/Shunts: No atrial level shunt detected by color flow Doppler.  LEFT  VENTRICLE PLAX 2D LVIDd:         4.00 cm   Diastology LVIDs:         2.40 cm   LV e' medial:    5.33 cm/s LV PW:         1.20 cm   LV E/e' medial:  10.7 LV IVS:        1.00 cm   LV e' lateral:   6.31 cm/s LVOT diam:     2.00 cm   LV E/e' lateral: 9.0 LV SV:         38 LV SV Index:   16 LVOT Area:     3.14 cm  LEFT ATRIUM             Index        RIGHT ATRIUM           Index LA diam:        2.70 cm 1.15 cm/m   RA Area:     15.50 cm LA Vol (A2C):   36.0 ml 15.32 ml/m  RA Volume:   33.80 ml  14.39 ml/m LA Vol (A4C):   63.4 ml 26.99 ml/m LA Biplane Vol: 49.1 ml 20.90 ml/m  AORTIC VALVE AV Area (Vmax):    2.54 cm AV Area (Vmean):   2.85 cm AV Area (VTI):     2.78 cm AV Vmax:           90.40 cm/s AV Vmean:          55.100 cm/s AV VTI:            0.138 m AV Peak Grad:      3.3 mmHg AV Mean Grad:      2.0 mmHg LVOT Vmax:         73.10 cm/s LVOT Vmean:        49.900 cm/s LVOT VTI:          0.122 m LVOT/AV VTI ratio: 0.88  AORTA Ao Root diam: 3.60 cm MITRAL VALVE MV Area (PHT): 4.06 cm    SHUNTS MV Area VTI:   2.44 cm  Systemic VTI:  0.12 m MV Peak grad:  2.5 mmHg    Systemic Diam: 2.00 cm MV Mean grad:  1.0 mmHg MV Vmax:       0.79 m/s MV Vmean:      49.8 cm/s MV Decel Time: 187 msec MV E velocity: 57.00 cm/s MV A velocity: 78.80 cm/s MV E/A ratio:  0.72 Kate Sable MD Electronically signed by Kate Sable MD Signature Date/Time: 07/23/2021/4:38:41 PM    Final    NM Bone Scan Whole Body  Result Date: 07/21/2021 CLINICAL DATA:  Prostate cancer suspected. PSA result on 07/19/2021 was 4.13. EXAM: NUCLEAR MEDICINE WHOLE BODY BONE SCAN TECHNIQUE: Whole body anterior and posterior images were obtained approximately 3 hours after intravenous injection of radiopharmaceutical. RADIOPHARMACEUTICALS:  20.08 mCi Technetium-29m MDP IV COMPARISON:  CT of the chest abdomen and pelvis on 07/19/2021, CT and MRI of the head on 07/19/2021 FINDINGS: There is bilateral renal activity. Bladder activity is present. There  are focal areas of increased activity in the sternoclavicular joints bilaterally, shoulders bilaterally, and RIGHT midfoot region. These are favored to represent degenerative changes. Degenerative changes are also noted in the lumbosacral junction, slightly asymmetric RIGHT greater than LEFT. There are no focal areas of increased activity to indicate presence of metastatic disease. IMPRESSION: No evidence for osseous metastatic disease. Multifocal areas of increased uptake consistent with degenerative changes. Electronically Signed   By: Nolon Nations M.D.   On: 07/21/2021 15:53   CT CHEST ABDOMEN PELVIS W CONTRAST  Result Date: 07/19/2021 CLINICAL DATA:  Mass in the cortex of the right frontal lobe concerning for metastasis. EXAM: CT CHEST, ABDOMEN, AND PELVIS WITH CONTRAST TECHNIQUE: Multidetector CT imaging of the chest, abdomen and pelvis was performed following the standard protocol during bolus administration of intravenous contrast. RADIATION DOSE REDUCTION: This exam was performed according to the departmental dose-optimization program which includes automated exposure control, adjustment of the mA and/or kV according to patient size and/or use of iterative reconstruction technique. CONTRAST:  12mL OMNIPAQUE IOHEXOL 300 MG/ML  SOLN COMPARISON:  March 10, 2019 FINDINGS: CT CHEST FINDINGS Cardiovascular: There is mild calcification of the aortic arch, without evidence of aortic aneurysm. Evaluation of the segmental and subsegmental pulmonary arteries within the bilateral lower lobes is limited secondary to patient motion. Normal heart size. No pericardial effusion. Mediastinum/Nodes: Lungs/Pleura: There is moderate severity emphysematous lung disease. A 5.9 cm x 4.2 cm x 6.0 cm heterogeneous lung mass is seen within the anteromedial aspect of the left upper lobe. This represents a new finding when compared to the prior study. There is no evidence of acute infiltrate, pleural effusion or pneumothorax.  Musculoskeletal: There is evidence of prior vertebroplasty at the level of T12 vertebral body. Multilevel degenerative changes are seen throughout the thoracic spine. CT ABDOMEN PELVIS FINDINGS Hepatobiliary: A 2.8 cm x 2.9 cm ill-defined low-attenuation liver lesion is seen within the dome of the liver. Additional 0.9 cm and 0.8 cm foci of low attenuation are noted within the right lobe. There is no evidence of gallstones, gallbladder wall thickening or pericholecystic inflammation. No biliary dilatation is identified. Pancreas: Unremarkable. No pancreatic ductal dilatation or surrounding inflammatory changes. Spleen: No splenic injury or perisplenic hematoma. Adrenals/Urinary Tract: A 1.9 cm x 1.6 cm low-attenuation left adrenal mass is seen (approximately 30.08 Hounsfield units). The right adrenal gland is unremarkable. Kidneys are normal in size, without obstructing renal calculi, focal lesion, or hydronephrosis. Bladder is unremarkable. Stomach/Bowel: There is a small hiatal hernia. Appendix appears normal. No evidence  of bowel wall thickening, distention, or inflammatory changes. Noninflamed diverticula are seen within the descending and sigmoid colon. Vascular/Lymphatic: Aortic atherosclerosis. No enlarged abdominal or pelvic lymph nodes. Reproductive: The prostate gland is not clearly identified. Other: No abdominal wall hernia or abnormality. No abdominopelvic ascites. Musculoskeletal: Multilevel degenerative changes seen throughout the lumbar spine. IMPRESSION: 1. 5.9 cm x 4.2 cm x 6.0 cm heterogeneous lung mass within the anteromedial aspect of the left upper lobe, consistent with a primary lung malignancy. 2. Moderate severity emphysematous lung disease. 3. Multiple low-attenuation liver lesions, concerning for metastatic disease. Correlation with nonemergent hepatic MRI is recommended. 4. Low-attenuation left adrenal mass which may represent an adrenal adenoma. Sequelae associated with metastatic  disease cannot completely be excluded. 5. Colonic diverticulosis. 6. Evidence of prior vertebroplasty at the level of T12 vertebral body. 7. Small hiatal hernia. Aortic Atherosclerosis (ICD10-I70.0) and Emphysema (ICD10-J43.9). Electronically Signed   By: Virgina Norfolk M.D.   On: 07/19/2021 19:27   MR Brain W and Wo Contrast  Result Date: 07/19/2021 CLINICAL DATA:  History of prostate cancer. Acute presentation today after falling. Abnormal head CT. EXAM: MRI HEAD WITHOUT AND WITH CONTRAST TECHNIQUE: Multiplanar, multiecho pulse sequences of the brain and surrounding structures were obtained without and with intravenous contrast. CONTRAST:  85mL GADAVIST GADOBUTROL 1 MMOL/ML IV SOLN COMPARISON:  Head CT earlier same day FINDINGS: Brain: Diffusion imaging does not show any acute or subacute infarction. No abnormality is seen affecting the brainstem or cerebellum. There is cerebral hemispheric atrophy, temporal lobe predominant. There is an 8 mm enhancing mass in the left occipital lobe with surrounding vasogenic edema. There is a 13 mm mass in the right frontal lobe with regional edema. There is a 2 mm enhancing lesion within the cerebellar vermis postcontrast axial image 46. There is a 2 mm enhancing lesion within the left occipital lobe, postcontrast image 62. There is a 3 mm enhancing lesion in the right external capsule region, postcontrast axial image 69. These most likely represent metastatic disease. Susceptibility weighted imaging shows signal loss, suggesting that there is microhemorrhage within these metastases. 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 There are mild chronic small-vessel ischemic changes affecting the white matter. No hydrocephalus. No extra-axial collection. Vascular: Major vessels at the base of the brain show flow. Skull and upper cervical  spine: Negative Sinuses/Orbits: Clear/normal Other: None IMPRESSION: 13 mm micro hemorrhagic metastasis in the right frontal lobe with regional edema. 8 mm micro hemorrhagic metastasis in the left occipital lobe with regional edema. 3 mm micro hemorrhagic metastasis in the external capsule on the right with very minimal associated edema. Punctate/2 mm metastases visible in the cerebellar vermis and left occipital lobe consistent with tiny metastases. Electronically Signed   By: Nelson Chimes M.D.   On: 07/19/2021 19:14   CT Head Wo Contrast  Result Date: 07/19/2021 CLINICAL DATA:  Syncope EXAM: CT HEAD WITHOUT CONTRAST TECHNIQUE: Contiguous axial images were obtained from the base of the skull through the vertex without intravenous contrast. RADIATION DOSE REDUCTION: This exam was performed according to the departmental dose-optimization program which includes automated exposure control, adjustment of the mA and/or kV according to patient size and/or use of iterative reconstruction technique. COMPARISON:  September 24, 2017 CT scan FINDINGS: Brain: No subdural, epidural, or subarachnoid hemorrhage. There is a mass in the right frontal lobe as identified on axial image 20 and sagittal image  30 measuring 1.2 x 1.3 cm. There is adjacent vasogenic edema. There is low attenuation in the left occipital lobe on axial image 17 which could represent a small stroke, new since February 2021. The low-attenuation could be another site of vasogenic edema but a definitive mass is not noted. No other masses identified. No midline shift. Ventricles and sulci are unremarkable. Cerebellum, brainstem, and basal cisterns are normal. Chronic white matter changes. No acute cortical ischemia or infarct. Vascular: Calcified atherosclerotic change in the intracranial carotids. Skull: Normal. Negative for fracture or focal lesion. Sinuses/Orbits: No acute finding. Other: None. IMPRESSION: 1. There is a 1.2 x 1.3 cm mass in the cortex of the  right frontal lobe medially with adjacent vasogenic edema. The finding is concerning for a metastasis. Recommend an MRI. 2. A region of low attenuation in the left occipital lobe could represent a small stroke, new since February 2021 or a second site of vasogenic edema. Recommend attention to this region on follow-up. 3. No midline shift. 4. Chronic white matter changes. Electronically Signed   By: Dorise Bullion III M.D.   On: 07/19/2021 17:03   DG Chest Port 1 View  Result Date: 07/19/2021 CLINICAL DATA:  Unwitnessed fall.  Weakness.  Hypotension. EXAM: PORTABLE CHEST 1 VIEW COMPARISON:  02/07/2017 FINDINGS: Heart size is within normal limits. A rounded masslike opacity is seen in the left mid lung measuring approximately 5 x 7 cm. This could be due to carcinoma or pulmonary contusion. No evidence of pleural effusion. Right lung is clear. IMPRESSION: Rounded masslike opacity in left mid lung, which could be due to carcinoma or pulmonary contusion. Chest CT with contrast is recommended for further evaluation. Electronically Signed   By: Marlaine Hind M.D.   On: 07/19/2021 16:50    ASSESSMENT: Stage IVB prostate cancer.  PLAN:    1.  Stage IVB prostate cancer: Biopsy and subsequent RFA of the T12 bony lesion confirmed metastatic prostate cancer.  Patient's PSA was initially 213, but now has trended down to 8.27.  Today's result is pending.  Continue with 45 mg Eligard every 6 months.  Given his minimal bony disease, he does not require Zometa at this time.  Return to clinic in 6 months with repeat laboratory work, further evaluation, and continuation of treatment. 2.  Back pain: Resolved.  RFA as above.  3.  Anemia: Mild.  Patient's most recent hemoglobin is 11.9.    I spent a total of 30 minutes reviewing chart data, face-to-face evaluation with the patient, counseling and coordination of care as detailed above.     Patient expressed understanding and was in agreement with this plan. He also  understands that He can call clinic at any time with any questions, concerns, or complaints.    Cancer Staging  Prostate cancer [facility]) Staging form: Prostate, AJCC 8th Edition - Clinical stage from 04/11/2020: Stage IVB (cTX, cN0, cM1b, PSA: 213) - Signed by Lloyd Huger, MD on 04/11/2020 Prostate specific antigen (PSA) range: 20 or greater   Lloyd Huger, MD   07/29/2021 7:53 AM

## 2021-07-30 ENCOUNTER — Other Ambulatory Visit: Payer: Self-pay

## 2021-07-30 ENCOUNTER — Ambulatory Visit
Admission: RE | Admit: 2021-07-30 | Discharge: 2021-07-30 | Disposition: A | Discharge status: Home / Self Care | Payer: Medicare Other | Source: Ambulatory Visit | Attending: Radiation Oncology | Admitting: Radiation Oncology

## 2021-07-30 DIAGNOSIS — Z510 Encounter for antineoplastic radiation therapy: Secondary | ICD-10-CM | POA: Diagnosis not present

## 2021-07-30 LAB — RAD ONC ARIA SESSION SUMMARY
Course Elapsed Days: 0
Plan Fractions Treated to Date: 1
Plan Prescribed Dose Per Fraction: 3 Gy
Plan Total Fractions Prescribed: 10
Plan Total Prescribed Dose: 30 Gy
Reference Point Dosage Given to Date: 3 Gy
Reference Point Session Dosage Given: 3 Gy
Session Number: 1

## 2021-07-31 ENCOUNTER — Other Ambulatory Visit: Payer: Self-pay

## 2021-07-31 ENCOUNTER — Ambulatory Visit
Admission: RE | Admit: 2021-07-31 | Discharge: 2021-07-31 | Disposition: A | Discharge status: Home / Self Care | Payer: Medicare Other | Source: Ambulatory Visit | Attending: Radiation Oncology | Admitting: Radiation Oncology

## 2021-07-31 ENCOUNTER — Inpatient Hospital Stay: Payer: Medicare Other | Admitting: Oncology

## 2021-07-31 DIAGNOSIS — C61 Malignant neoplasm of prostate: Secondary | ICD-10-CM

## 2021-07-31 DIAGNOSIS — Z510 Encounter for antineoplastic radiation therapy: Secondary | ICD-10-CM | POA: Diagnosis not present

## 2021-07-31 LAB — RAD ONC ARIA SESSION SUMMARY
Course Elapsed Days: 1
Plan Fractions Treated to Date: 2
Plan Prescribed Dose Per Fraction: 3 Gy
Plan Total Fractions Prescribed: 10
Plan Total Prescribed Dose: 30 Gy
Reference Point Dosage Given to Date: 6 Gy
Reference Point Session Dosage Given: 3 Gy
Session Number: 2

## 2021-08-01 ENCOUNTER — Other Ambulatory Visit: Payer: Self-pay

## 2021-08-01 ENCOUNTER — Ambulatory Visit
Admission: RE | Admit: 2021-08-01 | Discharge: 2021-08-01 | Disposition: A | Discharge status: Home / Self Care | Payer: Medicare Other | Source: Ambulatory Visit | Attending: Radiation Oncology | Admitting: Radiation Oncology

## 2021-08-01 DIAGNOSIS — Z510 Encounter for antineoplastic radiation therapy: Secondary | ICD-10-CM | POA: Diagnosis not present

## 2021-08-01 LAB — RAD ONC ARIA SESSION SUMMARY
Course Elapsed Days: 2
Plan Fractions Treated to Date: 3
Plan Prescribed Dose Per Fraction: 3 Gy
Plan Total Fractions Prescribed: 10
Plan Total Prescribed Dose: 30 Gy
Reference Point Dosage Given to Date: 9 Gy
Reference Point Session Dosage Given: 3 Gy
Session Number: 3

## 2021-08-01 NOTE — Progress Notes (Unsigned)
[place]  [phone] [phone]) [phone]  ID: Evan Gonzalez OB: 26-Aug-1940  MR#: [id number]  EYC#:[id number]  Patient Care Team: Pcp, No as PCP - General Grayland Ormond Kathlene November, MD as Consulting Physician (Hematology and Oncology) Telford Nab, RN as Oncology Nurse Navigator  CHIEF COMPLAINT: Stage IVB prostate cancer.  INTERVAL HISTORY: Patient returns to clinic today for repeat laboratory work, routine 44-month evaluation, and continuation of Eligard.  He continues to feel well and remains asymptomatic. He denies any pain. He has no neurologic complaints.  He denies any recent fevers or illnesses.  He has a good appetite and denies weight loss.  He has no chest pain, shortness of breath, cough, or hemoptysis.  He denies any nausea, vomiting, constipation or diarrhea.  He has no urinary complaints.  Patient feels at his baseline and offers no specific complaints today.  REVIEW OF SYSTEMS:   Review of Systems  Constitutional: Negative.  Negative for fever and malaise/fatigue.  Respiratory: Negative.  Negative for cough and shortness of breath.   Cardiovascular: Negative.  Negative for chest pain and leg swelling.  Gastrointestinal: Negative.  Negative for abdominal pain and diarrhea.  Genitourinary: Negative.  Negative for flank pain.  Musculoskeletal: Negative.  Negative for back pain.  Skin: Negative.  Negative for rash.  Neurological: Negative.  Negative for dizziness, focal weakness, weakness and headaches.  Psychiatric/Behavioral: Negative.  The patient is not nervous/anxious.     As per HPI. Otherwise, a complete review of systems is negative.  PAST MEDICAL HISTORY: Past Medical History:  Diagnosis Date   Diabetes mellitus without complication (HCC)    Glaucoma    Hyperlipidemia    Hypertension     PAST SURGICAL HISTORY: Past Surgical History:  Procedure Laterality Date   EYE SURGERY     IR BONE TUMOR(S)RF ABLATION  03/28/2019   IR  KYPHO THORACIC WITH BONE BIOPSY  03/28/2019   IR RADIOLOGIST EVAL & MGMT  03/21/2019   IR RADIOLOGIST EVAL & MGMT  04/11/2019    FAMILY HISTORY: Family History  Problem Relation Age of Onset   Heart attack Mother    Diabetes Father     ADVANCED DIRECTIVES (Y/N):  N  HEALTH MAINTENANCE: Social History   Tobacco Use   Smoking status: Former    Types: Cigarettes    Quit date: 02/08/1987    Years since quitting: 34.5   Smokeless tobacco: Never  Vaping Use   Vaping Use: Never used  Substance Use Topics   Alcohol use: No   Drug use: No     Colonoscopy:  PAP:  Bone density:  Lipid panel:  No Known Allergies  Current Outpatient Medications  Medication Sig Dispense Refill   acetaminophen (TYLENOL) 650 MG CR tablet Take 650 mg by mouth 2 (two) times daily.     amLODipine (NORVASC) 5 MG tablet Take 5 mg by mouth daily.     cetirizine (ZYRTEC) 10 MG tablet Take 10 mg by mouth daily.     dexamethasone (DECADRON) 2 MG tablet Take 1.5 tablets (3 mg total) by mouth 3 (three) times daily for 3 days, THEN 1 tablet (2 mg total) 3 (three) times daily for 3 days, THEN 1 tablet (2 mg total) 2 (two) times daily for 24 days. To continue dose / change dose per oncology recommendations. 70.5 tablet 0   dipyridamole-aspirin (AGGRENOX) 200-25 MG 12hr capsule Take 1 capsule by mouth 2 (two) times daily.     dorzolamide (TRUSOPT) 2 % ophthalmic solution Place  1 drop into both eyes 2 (two) times daily.     latanoprost (XALATAN) 0.005 % ophthalmic solution Place 1 drop into both eyes at bedtime.     metFORMIN (GLUCOPHAGE) 500 MG tablet Take 500 mg by mouth 2 (two) times daily with a meal.     NONFORMULARY OR COMPOUNDED ITEM See pharmacy note 120 each 2   simvastatin (ZOCOR) 20 MG tablet Take 20 mg by mouth at bedtime.     traZODone (DESYREL) 50 MG tablet Take 50 mg by mouth at bedtime.     No current facility-administered medications for this visit.    OBJECTIVE: There were no vitals filed for this  visit.    There is no height or weight on file to calculate BMI.    ECOG FS:0 - Asymptomatic  General: Well-developed, well-nourished, no acute distress. Eyes: Pink conjunctiva, anicteric sclera. HEENT: Normocephalic, moist mucous membranes. Lungs: No audible wheezing or coughing. Heart: Regular rate and rhythm. Abdomen: Soft, nontender, no obvious distention. Musculoskeletal: No edema, cyanosis, or clubbing. Neuro: Alert, answering all questions appropriately. Cranial nerves grossly intact. Skin: No rashes or petechiae noted. Psych: Normal affect.  LAB RESULTS:  Lab Results  Component Value Date   NA 134 (L) 07/24/2021   K 5.1 07/24/2021   CL 102 07/24/2021   CO2 26 07/24/2021   GLUCOSE 178 (H) 07/24/2021   BUN 19 07/24/2021   CREATININE 0.63 07/24/2021   CALCIUM 9.4 07/24/2021   PROT 7.9 07/19/2021   ALBUMIN 3.2 (L) 07/19/2021   AST 27 07/19/2021   ALT 15 07/19/2021   ALKPHOS 29 (L) 07/19/2021   BILITOT 0.7 07/19/2021   GFRNONAA >60 07/24/2021   GFRAA >60 10/10/2019    Lab Results  Component Value Date   WBC 9.3 07/24/2021   NEUTROABS 5.3 07/19/2021   HGB 9.6 (L) 07/24/2021   HCT 29.3 (L) 07/24/2021   MCV 85.9 07/24/2021   PLT 307 07/24/2021     STUDIES: DG Chest Port 1 View  Result Date: 07/24/2021 CLINICAL DATA:  Evaluate for pneumothorax following biopsy. EXAM: PORTABLE CHEST 1 VIEW COMPARISON:  Chest radiograph 07/24/2021 at 0935 hours FINDINGS: Again noted is the mass along the medial left chest. Negative for pneumothorax. Linear densities in the mid left lung are similar to the previous examination. No new airspace disease or consolidation. Heart and mediastinum are stable. The trachea is midline. IMPRESSION: 1. Negative for pneumothorax. 2. Stable appearance of the left lung mass. Electronically Signed   By: Markus Daft M.D.   On: 07/24/2021 12:07   CT LUNG MASS BIOPSY  Result Date: 07/24/2021 INDICATION: Lung mass. EXAM: CT-GUIDED LEFT LUNG MASS BIOPSY  RADIATION DOSE REDUCTION: This exam was performed according to the departmental dose-optimization program which includes automated exposure control, adjustment of the mA and/or kV according to patient size and/or use of iterative reconstruction technique. COMPARISON:  CT CAP, 07/19/2021. MEDICATIONS: None. ANESTHESIA/SEDATION: Local anesthetic was administered. The patient was continuously monitored during the procedure by the interventional radiology nurse under my direct supervision. CONTRAST:  None COMPLICATIONS: None immediate. PROCEDURE: Informed consent was obtained from the the patient and/or patient's representative following an explanation of the procedure, risks, benefits and alternatives. The patient understands,agrees and consents for the procedure. All questions were addressed. A time out was performed prior to the initiation of the procedure. The patient was positioned supine on the CT table and a limited chest CT was performed for procedural planning demonstrating large LEFT lung mass. The operative site was prepped and  draped in the usual sterile fashion. Under sterile conditions and local anesthesia, a 17 gauge coaxial needle was advanced into the peripheral aspect of the nodule. Positioning was confirmed with intermittent CT fluoroscopy and followed by the acquisition of 3 core biopsy with an 18 gauge device. The coaxial needle was removed following deployment of a Biosentry plug and superficial hemostasis was achieved with manual compression. Limited post procedural chest CT demonstrated a small volume pneumothorax. A dressing was placed. The patient tolerated the procedure well without immediate postprocedural complication. The patient was escorted to have an upright chest radiograph. IMPRESSION: 1. Successful CT guided core biopsy of LEFT lung mass, as above. 2. Small volume LEFT anterior postprocedure pneumothorax. PLAN: Patient remained vital signs and hemodynamically stable. Additional  immediate intervention was not required. An immediate upright portable chest XR was acquired, within additional 1 hour post XR for follow-up. Michaelle Birks, MD [facility] Electronically Signed   By: Michaelle Birks M.D.   On: 07/24/2021 09:59   DG Chest Port 1 View  Result Date: 07/24/2021 CLINICAL DATA:  Status post lung biopsy EXAM: PORTABLE CHEST 1 VIEW COMPARISON:  07/19/2021 FINDINGS: Large left upper lobe pulmonary mass again noted. No definite pneumothorax. Bilateral mild interstitial thickening. Heart and mediastinal contours are unremarkable. No acute osseous abnormality. IMPRESSION: 1. Left upper lobe pulmonary mass.  No pneumothorax. Electronically Signed   By: Kathreen Devoid M.D.   On: 07/24/2021 09:49   ECHOCARDIOGRAM COMPLETE  Result Date: 07/23/2021    ECHOCARDIOGRAM REPORT   Patient Name:   FINLEY CHEVEZ Date of Exam: 07/23/2021 Medical Rec #:  [id number]          Height:       71.0 in Accession #:    [id number]         Weight:       257.9 lb Date of Birth:  [birth year]         BSA:          2.349 m Patient Age:    69 years           BP:           120/61 mmHg Patient Gender: M                  HR:           42 bpm. Exam Location:  [facility] Procedure: 2D Echo, Cardiac Doppler and Color Doppler Indications:     Abnormal ECG R64.31  History:         Patient has no prior history of Echocardiogram examinations.                  Risk Factors:Hypertension, Diabetes and Dyslipidemia.  Sonographer:     Sherrie Sport Referring Phys:  [id number] Kate Sable Diagnosing Phys: Kate Sable MD  Sonographer Comments: Technically challenging study due to limited acoustic windows, no parasternal window, suboptimal apical window and no subcostal window. IMPRESSIONS  1. Left ventricular ejection fraction, by estimation, is 55 to 60%. The left ventricle has normal function. The left ventricle has no regional wall motion abnormalities. Left ventricular  diastolic parameters are consistent with Grade I diastolic dysfunction (impaired relaxation).  2. Right ventricular systolic function was not well visualized. The right ventricular size is not well visualized.  3. The mitral valve is grossly normal. No evidence of mitral valve regurgitation.  4. The aortic valve was not well visualized. Aortic valve regurgitation is not visualized.  FINDINGS  Left Ventricle: Left ventricular ejection fraction, by estimation, is 55 to 60%. The left ventricle has normal function. The left ventricle has no regional wall motion abnormalities. The left ventricular internal cavity size was normal in size. There is  no left ventricular hypertrophy. Left ventricular diastolic parameters are consistent with Grade I diastolic dysfunction (impaired relaxation). Right Ventricle: The right ventricular size is not well visualized. Right vetricular wall thickness was not well visualized. Right ventricular systolic function was not well visualized. Left Atrium: Left atrial size was normal in size. Right Atrium: Right atrial size was not well visualized. Pericardium: There is no evidence of pericardial effusion. Mitral Valve: The mitral valve is grossly normal. No evidence of mitral valve regurgitation. MV peak gradient, 2.5 mmHg. The mean mitral valve gradient is 1.0 mmHg. Tricuspid Valve: The tricuspid valve is not well visualized. Tricuspid valve regurgitation is not demonstrated. Aortic Valve: The aortic valve was not well visualized. Aortic valve regurgitation is not visualized. Aortic valve mean gradient measures 2.0 mmHg. Aortic valve peak gradient measures 3.3 mmHg. Aortic valve area, by VTI measures 2.78 cm. Pulmonic Valve: The pulmonic valve was not well visualized. Pulmonic valve regurgitation is not visualized. Aorta: The aortic root was not well visualized. Venous: The inferior vena cava was not well visualized. IAS/Shunts: No atrial level shunt detected by color flow Doppler.  LEFT  VENTRICLE PLAX 2D LVIDd:         4.00 cm   Diastology LVIDs:         2.40 cm   LV e' medial:    5.33 cm/s LV PW:         1.20 cm   LV E/e' medial:  10.7 LV IVS:        1.00 cm   LV e' lateral:   6.31 cm/s LVOT diam:     2.00 cm   LV E/e' lateral: 9.0 LV SV:         38 LV SV Index:   16 LVOT Area:     3.14 cm  LEFT ATRIUM             Index        RIGHT ATRIUM           Index LA diam:        2.70 cm 1.15 cm/m   RA Area:     15.50 cm LA Vol (A2C):   36.0 ml 15.32 ml/m  RA Volume:   33.80 ml  14.39 ml/m LA Vol (A4C):   63.4 ml 26.99 ml/m LA Biplane Vol: 49.1 ml 20.90 ml/m  AORTIC VALVE AV Area (Vmax):    2.54 cm AV Area (Vmean):   2.85 cm AV Area (VTI):     2.78 cm AV Vmax:           90.40 cm/s AV Vmean:          55.100 cm/s AV VTI:            0.138 m AV Peak Grad:      3.3 mmHg AV Mean Grad:      2.0 mmHg LVOT Vmax:         73.10 cm/s LVOT Vmean:        49.900 cm/s LVOT VTI:          0.122 m LVOT/AV VTI ratio: 0.88  AORTA Ao Root diam: 3.60 cm MITRAL VALVE MV Area (PHT): 4.06 cm    SHUNTS MV Area VTI:   2.44 cm  Systemic VTI:  0.12 m MV Peak grad:  2.5 mmHg    Systemic Diam: 2.00 cm MV Mean grad:  1.0 mmHg MV Vmax:       0.79 m/s MV Vmean:      49.8 cm/s MV Decel Time: 187 msec MV E velocity: 57.00 cm/s MV A velocity: 78.80 cm/s MV E/A ratio:  0.72 Kate Sable MD Electronically signed by Kate Sable MD Signature Date/Time: 07/23/2021/4:38:41 PM    Final    NM Bone Scan Whole Body  Result Date: 07/21/2021 CLINICAL DATA:  Prostate cancer suspected. PSA result on 07/19/2021 was 4.13. EXAM: NUCLEAR MEDICINE WHOLE BODY BONE SCAN TECHNIQUE: Whole body anterior and posterior images were obtained approximately 3 hours after intravenous injection of radiopharmaceutical. RADIOPHARMACEUTICALS:  20.08 mCi Technetium-25m MDP IV COMPARISON:  CT of the chest abdomen and pelvis on 07/19/2021, CT and MRI of the head on 07/19/2021 FINDINGS: There is bilateral renal activity. Bladder activity is present. There  are focal areas of increased activity in the sternoclavicular joints bilaterally, shoulders bilaterally, and RIGHT midfoot region. These are favored to represent degenerative changes. Degenerative changes are also noted in the lumbosacral junction, slightly asymmetric RIGHT greater than LEFT. There are no focal areas of increased activity to indicate presence of metastatic disease. IMPRESSION: No evidence for osseous metastatic disease. Multifocal areas of increased uptake consistent with degenerative changes. Electronically Signed   By: Nolon Nations M.D.   On: 07/21/2021 15:53   CT CHEST ABDOMEN PELVIS W CONTRAST  Result Date: 07/19/2021 CLINICAL DATA:  Mass in the cortex of the right frontal lobe concerning for metastasis. EXAM: CT CHEST, ABDOMEN, AND PELVIS WITH CONTRAST TECHNIQUE: Multidetector CT imaging of the chest, abdomen and pelvis was performed following the standard protocol during bolus administration of intravenous contrast. RADIATION DOSE REDUCTION: This exam was performed according to the departmental dose-optimization program which includes automated exposure control, adjustment of the mA and/or kV according to patient size and/or use of iterative reconstruction technique. CONTRAST:  175mL OMNIPAQUE IOHEXOL 300 MG/ML  SOLN COMPARISON:  March 10, 2019 FINDINGS: CT CHEST FINDINGS Cardiovascular: There is mild calcification of the aortic arch, without evidence of aortic aneurysm. Evaluation of the segmental and subsegmental pulmonary arteries within the bilateral lower lobes is limited secondary to patient motion. Normal heart size. No pericardial effusion. Mediastinum/Nodes: Lungs/Pleura: There is moderate severity emphysematous lung disease. A 5.9 cm x 4.2 cm x 6.0 cm heterogeneous lung mass is seen within the anteromedial aspect of the left upper lobe. This represents a new finding when compared to the prior study. There is no evidence of acute infiltrate, pleural effusion or pneumothorax.  Musculoskeletal: There is evidence of prior vertebroplasty at the level of T12 vertebral body. Multilevel degenerative changes are seen throughout the thoracic spine. CT ABDOMEN PELVIS FINDINGS Hepatobiliary: A 2.8 cm x 2.9 cm ill-defined low-attenuation liver lesion is seen within the dome of the liver. Additional 0.9 cm and 0.8 cm foci of low attenuation are noted within the right lobe. There is no evidence of gallstones, gallbladder wall thickening or pericholecystic inflammation. No biliary dilatation is identified. Pancreas: Unremarkable. No pancreatic ductal dilatation or surrounding inflammatory changes. Spleen: No splenic injury or perisplenic hematoma. Adrenals/Urinary Tract: A 1.9 cm x 1.6 cm low-attenuation left adrenal mass is seen (approximately 30.08 Hounsfield units). The right adrenal gland is unremarkable. Kidneys are normal in size, without obstructing renal calculi, focal lesion, or hydronephrosis. Bladder is unremarkable. Stomach/Bowel: There is a small hiatal hernia. Appendix appears normal. No evidence  of bowel wall thickening, distention, or inflammatory changes. Noninflamed diverticula are seen within the descending and sigmoid colon. Vascular/Lymphatic: Aortic atherosclerosis. No enlarged abdominal or pelvic lymph nodes. Reproductive: The prostate gland is not clearly identified. Other: No abdominal wall hernia or abnormality. No abdominopelvic ascites. Musculoskeletal: Multilevel degenerative changes seen throughout the lumbar spine. IMPRESSION: 1. 5.9 cm x 4.2 cm x 6.0 cm heterogeneous lung mass within the anteromedial aspect of the left upper lobe, consistent with a primary lung malignancy. 2. Moderate severity emphysematous lung disease. 3. Multiple low-attenuation liver lesions, concerning for metastatic disease. Correlation with nonemergent hepatic MRI is recommended. 4. Low-attenuation left adrenal mass which may represent an adrenal adenoma. Sequelae associated with metastatic  disease cannot completely be excluded. 5. Colonic diverticulosis. 6. Evidence of prior vertebroplasty at the level of T12 vertebral body. 7. Small hiatal hernia. Aortic Atherosclerosis (ICD10-I70.0) and Emphysema (ICD10-J43.9). Electronically Signed   By: Virgina Norfolk M.D.   On: 07/19/2021 19:27   MR Brain W and Wo Contrast  Result Date: 07/19/2021 CLINICAL DATA:  History of prostate cancer. Acute presentation today after falling. Abnormal head CT. EXAM: MRI HEAD WITHOUT AND WITH CONTRAST TECHNIQUE: Multiplanar, multiecho pulse sequences of the brain and surrounding structures were obtained without and with intravenous contrast. CONTRAST:  57mL GADAVIST GADOBUTROL 1 MMOL/ML IV SOLN COMPARISON:  Head CT earlier same day FINDINGS: Brain: Diffusion imaging does not show any acute or subacute infarction. No abnormality is seen affecting the brainstem or cerebellum. There is cerebral hemispheric atrophy, temporal lobe predominant. There is an 8 mm enhancing mass in the left occipital lobe with surrounding vasogenic edema. There is a 13 mm mass in the right frontal lobe with regional edema. There is a 2 mm enhancing lesion within the cerebellar vermis postcontrast axial image 46. There is a 2 mm enhancing lesion within the left occipital lobe, postcontrast image 62. There is a 3 mm enhancing lesion in the right external capsule region, postcontrast axial image 69. These most likely represent metastatic disease. Susceptibility weighted imaging shows signal loss, suggesting that there is microhemorrhage within these metastases. 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 There are mild chronic small-vessel ischemic changes affecting the white matter. No hydrocephalus. No extra-axial collection. Vascular: Major vessels at the base of the brain show flow. Skull and upper cervical  spine: Negative Sinuses/Orbits: Clear/normal Other: None IMPRESSION: 13 mm micro hemorrhagic metastasis in the right frontal lobe with regional edema. 8 mm micro hemorrhagic metastasis in the left occipital lobe with regional edema. 3 mm micro hemorrhagic metastasis in the external capsule on the right with very minimal associated edema. Punctate/2 mm metastases visible in the cerebellar vermis and left occipital lobe consistent with tiny metastases. Electronically Signed   By: Nelson Chimes M.D.   On: 07/19/2021 19:14   CT Head Wo Contrast  Result Date: 07/19/2021 CLINICAL DATA:  Syncope EXAM: CT HEAD WITHOUT CONTRAST TECHNIQUE: Contiguous axial images were obtained from the base of the skull through the vertex without intravenous contrast. RADIATION DOSE REDUCTION: This exam was performed according to the departmental dose-optimization program which includes automated exposure control, adjustment of the mA and/or kV according to patient size and/or use of iterative reconstruction technique. COMPARISON:  September 24, 2017 CT scan FINDINGS: Brain: No subdural, epidural, or subarachnoid hemorrhage. There is a mass in the right frontal lobe as identified on axial image 20 and sagittal image  30 measuring 1.2 x 1.3 cm. There is adjacent vasogenic edema. There is low attenuation in the left occipital lobe on axial image 17 which could represent a small stroke, new since February 2021. The low-attenuation could be another site of vasogenic edema but a definitive mass is not noted. No other masses identified. No midline shift. Ventricles and sulci are unremarkable. Cerebellum, brainstem, and basal cisterns are normal. Chronic white matter changes. No acute cortical ischemia or infarct. Vascular: Calcified atherosclerotic change in the intracranial carotids. Skull: Normal. Negative for fracture or focal lesion. Sinuses/Orbits: No acute finding. Other: None. IMPRESSION: 1. There is a 1.2 x 1.3 cm mass in the cortex of the  right frontal lobe medially with adjacent vasogenic edema. The finding is concerning for a metastasis. Recommend an MRI. 2. A region of low attenuation in the left occipital lobe could represent a small stroke, new since February 2021 or a second site of vasogenic edema. Recommend attention to this region on follow-up. 3. No midline shift. 4. Chronic white matter changes. Electronically Signed   By: Dorise Bullion III M.D.   On: 07/19/2021 17:03   DG Chest Port 1 View  Result Date: 07/19/2021 CLINICAL DATA:  Unwitnessed fall.  Weakness.  Hypotension. EXAM: PORTABLE CHEST 1 VIEW COMPARISON:  02/07/2017 FINDINGS: Heart size is within normal limits. A rounded masslike opacity is seen in the left mid lung measuring approximately 5 x 7 cm. This could be due to carcinoma or pulmonary contusion. No evidence of pleural effusion. Right lung is clear. IMPRESSION: Rounded masslike opacity in left mid lung, which could be due to carcinoma or pulmonary contusion. Chest CT with contrast is recommended for further evaluation. Electronically Signed   By: Marlaine Hind M.D.   On: 07/19/2021 16:50    ASSESSMENT: Stage IVB prostate cancer.  PLAN:    1.  Stage IVB prostate cancer: Biopsy and subsequent RFA of the T12 bony lesion confirmed metastatic prostate cancer.  Patient's PSA was initially 213, but now has trended down to 8.27.  Today's result is pending.  Continue with 45 mg Eligard every 6 months.  Given his minimal bony disease, he does not require Zometa at this time.  Return to clinic in 6 months with repeat laboratory work, further evaluation, and continuation of treatment. 2.  Back pain: Resolved.  RFA as above.  3.  Anemia: Mild.  Patient's most recent hemoglobin is 11.9.    I spent a total of 30 minutes reviewing chart data, face-to-face evaluation with the patient, counseling and coordination of care as detailed above.     Patient expressed understanding and was in agreement with this plan. He also  understands that He can call clinic at any time with any questions, concerns, or complaints.    Cancer Staging  Prostate cancer [facility]) Staging form: Prostate, AJCC 8th Edition - Clinical stage from 04/11/2020: Stage IVB (cTX, cN0, cM1b, PSA: 213) - Signed by Lloyd Huger, MD on 04/11/2020 Prostate specific antigen (PSA) range: 20 or greater   Lloyd Huger, MD   08/01/2021 12:10 PM

## 2021-08-04 ENCOUNTER — Ambulatory Visit
Admission: RE | Admit: 2021-08-04 | Discharge: 2021-08-04 | Disposition: A | Discharge status: Home / Self Care | Payer: Medicare Other | Source: Ambulatory Visit | Attending: Radiation Oncology | Admitting: Radiation Oncology

## 2021-08-04 ENCOUNTER — Other Ambulatory Visit: Payer: Self-pay

## 2021-08-04 DIAGNOSIS — Z510 Encounter for antineoplastic radiation therapy: Secondary | ICD-10-CM | POA: Diagnosis not present

## 2021-08-04 LAB — RAD ONC ARIA SESSION SUMMARY
Course Elapsed Days: 5
Plan Fractions Treated to Date: 4
Plan Prescribed Dose Per Fraction: 3 Gy
Plan Total Fractions Prescribed: 10
Plan Total Prescribed Dose: 30 Gy
Reference Point Dosage Given to Date: 12 Gy
Reference Point Session Dosage Given: 3 Gy
Session Number: 4

## 2021-08-05 ENCOUNTER — Inpatient Hospital Stay (HOSPITAL_BASED_OUTPATIENT_CLINIC_OR_DEPARTMENT_OTHER): Payer: Medicare Other | Admitting: Oncology

## 2021-08-05 ENCOUNTER — Other Ambulatory Visit: Payer: Self-pay | Admitting: *Deleted

## 2021-08-05 ENCOUNTER — Inpatient Hospital Stay: Payer: Medicare Other | Attending: Oncology

## 2021-08-05 ENCOUNTER — Encounter: Payer: Self-pay | Admitting: *Deleted

## 2021-08-05 ENCOUNTER — Ambulatory Visit
Admission: RE | Admit: 2021-08-05 | Discharge: 2021-08-05 | Disposition: A | Discharge status: Home / Self Care | Payer: Medicare Other | Source: Ambulatory Visit | Attending: Radiation Oncology | Admitting: Radiation Oncology

## 2021-08-05 ENCOUNTER — Encounter: Payer: Self-pay | Admitting: Oncology

## 2021-08-05 ENCOUNTER — Other Ambulatory Visit: Payer: Self-pay

## 2021-08-05 VITALS — BP 82/56 | HR 86 | Temp 97.0°F | Resp 16 | Ht 71.0 in | Wt 213.0 lb

## 2021-08-05 DIAGNOSIS — C7931 Secondary malignant neoplasm of brain: Secondary | ICD-10-CM | POA: Diagnosis not present

## 2021-08-05 DIAGNOSIS — C3490 Malignant neoplasm of unspecified part of unspecified bronchus or lung: Secondary | ICD-10-CM | POA: Insufficient documentation

## 2021-08-05 DIAGNOSIS — C3492 Malignant neoplasm of unspecified part of left bronchus or lung: Secondary | ICD-10-CM

## 2021-08-05 DIAGNOSIS — C61 Malignant neoplasm of prostate: Secondary | ICD-10-CM

## 2021-08-05 DIAGNOSIS — Z510 Encounter for antineoplastic radiation therapy: Secondary | ICD-10-CM | POA: Diagnosis not present

## 2021-08-05 LAB — CBC
HCT: 31.1 % — ABNORMAL LOW (ref 39.0–52.0)
Hemoglobin: 9.7 g/dL — ABNORMAL LOW (ref 13.0–17.0)
MCH: 28.4 pg (ref 26.0–34.0)
MCHC: 31.2 g/dL (ref 30.0–36.0)
MCV: 90.9 fL (ref 80.0–100.0)
Platelets: 249 10*3/uL (ref 150–400)
RBC: 3.42 MIL/uL — ABNORMAL LOW (ref 4.22–5.81)
RDW: 16.8 % — ABNORMAL HIGH (ref 11.5–15.5)
WBC: 8.3 10*3/uL (ref 4.0–10.5)
nRBC: 0 % (ref 0.0–0.2)

## 2021-08-05 LAB — COMPREHENSIVE METABOLIC PANEL
ALT: 22 U/L (ref 0–44)
AST: 20 U/L (ref 15–41)
Albumin: 3.4 g/dL — ABNORMAL LOW (ref 3.5–5.0)
Alkaline Phosphatase: 39 U/L (ref 38–126)
Anion gap: 12 (ref 5–15)
BUN: 53 mg/dL — ABNORMAL HIGH (ref 8–23)
CO2: 20 mmol/L — ABNORMAL LOW (ref 22–32)
Calcium: 9.4 mg/dL (ref 8.9–10.3)
Chloride: 100 mmol/L (ref 98–111)
Creatinine, Ser: 1.8 mg/dL — ABNORMAL HIGH (ref 0.61–1.24)
GFR, Estimated: 37 mL/min — ABNORMAL LOW (ref >60)
Glucose, Bld: 110 mg/dL — ABNORMAL HIGH (ref 70–99)
Potassium: 4 mmol/L (ref 3.5–5.1)
Sodium: 132 mmol/L — ABNORMAL LOW (ref 135–145)
Total Bilirubin: 1 mg/dL (ref 0.3–1.2)
Total Protein: 8.1 g/dL (ref 6.5–8.1)

## 2021-08-05 LAB — RAD ONC ARIA SESSION SUMMARY
Course Elapsed Days: 6
Plan Fractions Treated to Date: 5
Plan Prescribed Dose Per Fraction: 3 Gy
Plan Total Fractions Prescribed: 10
Plan Total Prescribed Dose: 30 Gy
Reference Point Dosage Given to Date: 15 Gy
Reference Point Session Dosage Given: 3 Gy
Session Number: 5

## 2021-08-05 LAB — PSA: Prostatic Specific Antigen: 7.98 ng/mL — ABNORMAL HIGH (ref 0.00–4.00)

## 2021-08-05 MED ORDER — HYDROCODONE-ACETAMINOPHEN 5-325 MG PO TABS: 1.0000 | ORAL_TABLET | Freq: Four times a day (QID) | ORAL | 0 refills | Status: DC | PRN

## 2021-08-05 MED ORDER — MEGESTROL ACETATE 40 MG PO TABS: 40.0000 mg | ORAL_TABLET | Freq: Every day | ORAL | 1 refills | Status: AC

## 2021-08-05 NOTE — Progress Notes (Signed)
Met with patient during follow up visit with Dr. Grayland Ormond. Introduced to navigator services. Reviewed upcoming appts. Contact info given and instructed to call with any questions or needs. Pt and his daughter verbalized understanding. Nothing further needed at this time.

## 2021-08-05 NOTE — Progress Notes (Signed)
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

## 2021-08-06 ENCOUNTER — Ambulatory Visit: Payer: Medicare Other

## 2021-08-06 ENCOUNTER — Encounter: Payer: Self-pay | Admitting: Oncology

## 2021-08-06 ENCOUNTER — Telehealth: Payer: Self-pay

## 2021-08-06 NOTE — Telephone Encounter (Signed)
Called and LVM for son with results. Asked him to call back with any questions or concerns.

## 2021-08-06 NOTE — Telephone Encounter (Signed)
-----   Message from Lloyd Huger, MD sent at 08/05/2021  4:37 PM EDT ----- Patient's creatinine has trended up to 1.8.  Can you please call family and encourage increased fluid intake.  Thanks! ----- Message ----- From: Buel Ream, [facility] [place] Sent: 08/05/2021  12:24 PM EDT To: Lloyd Huger, MD

## 2021-08-07 ENCOUNTER — Ambulatory Visit: Payer: Medicare Other

## 2021-08-08 ENCOUNTER — Other Ambulatory Visit: Payer: Self-pay

## 2021-08-08 ENCOUNTER — Ambulatory Visit
Admission: RE | Admit: 2021-08-08 | Discharge: 2021-08-08 | Disposition: A | Discharge status: Home / Self Care | Payer: Medicare Other | Source: Ambulatory Visit | Attending: Radiation Oncology | Admitting: Radiation Oncology

## 2021-08-08 DIAGNOSIS — Z510 Encounter for antineoplastic radiation therapy: Secondary | ICD-10-CM | POA: Diagnosis not present

## 2021-08-08 LAB — RAD ONC ARIA SESSION SUMMARY
Course Elapsed Days: 9
Plan Fractions Treated to Date: 6
Plan Prescribed Dose Per Fraction: 3 Gy
Plan Total Fractions Prescribed: 10
Plan Total Prescribed Dose: 30 Gy
Reference Point Dosage Given to Date: 18 Gy
Reference Point Session Dosage Given: 3 Gy
Session Number: 6

## 2021-08-08 NOTE — Progress Notes (Deleted)
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   INTERVAL HISTORY: Patient returns to clinic today for hospital follow-up, discussion of his pathology results, and treatment planning.  He continues with daily XRT to his brain and is tolerating treatment well.  His performance status is declining with increased weakness and fatigue and a poor appetite.  He also reports several falls this past week.  He has no chest pain, shortness of breath, cough, or hemoptysis.  He denies any nausea, vomiting, constipation or diarrhea.  He has no urinary complaints.  Patient feels generally terrible, but offers no further specific complaints today.  REVIEW OF SYSTEMS:   Review of Systems  Constitutional:  Positive for malaise/fatigue and weight loss. Negative for fever.  Respiratory: Negative.  Negative for cough and shortness of breath.   Cardiovascular: Negative.  Negative for chest pain and leg swelling.  Gastrointestinal: Negative.  Negative for abdominal pain and diarrhea.  Genitourinary: Negative.  Negative for flank pain.  Musculoskeletal:  Positive for back pain and falls.  Skin: Negative.  Negative for rash.  Neurological:  Positive for dizziness and weakness. Negative for focal weakness and headaches.  Psychiatric/Behavioral: Negative.  The patient is not nervous/anxious.     As per HPI. Otherwise, a complete review of systems is negative.  PAST MEDICAL HISTORY: Past Medical History:  Diagnosis Date   Diabetes mellitus without complication (HCC)    Glaucoma    Hyperlipidemia    Hypertension     PAST SURGICAL  HISTORY: Past Surgical History:  Procedure Laterality Date   EYE SURGERY     IR BONE TUMOR(S)RF ABLATION  03/28/2019   IR KYPHO THORACIC WITH BONE BIOPSY  03/28/2019   IR RADIOLOGIST EVAL & MGMT  03/21/2019   IR RADIOLOGIST EVAL & MGMT  04/11/2019    FAMILY HISTORY: Family History  Problem Relation Age of Onset   Heart attack Mother    Diabetes Father     ADVANCED DIRECTIVES (Y/N):  N  HEALTH MAINTENANCE: Social History   Tobacco Use   Smoking status: Former    Types: Cigarettes    Quit date: 02/08/1987    Years since quitting: 34.5   Smokeless tobacco: Never  Vaping Use   Vaping Use: Never used  Substance Use Topics   Alcohol use: No   Drug use: No     Colonoscopy:  PAP:  Bone density:  Lipid panel:  No Known Allergies  Current Outpatient Medications  Medication Sig Dispense Refill   acetaminophen (TYLENOL) 650 MG CR tablet Take 650 mg by mouth 2 (two) times daily.     amLODipine (NORVASC) 5 MG tablet Take 5 mg by mouth daily.     cetirizine (ZYRTEC) 10 MG tablet Take 10 mg by mouth daily.     dexamethasone (DECADRON) 2 MG tablet Take 1.5 tablets (3 mg total) by mouth 3 (three) times daily for 3 days, THEN 1 tablet (2 mg total) 3 (three) times daily for 3 days, THEN 1 tablet (2 mg total) 2 (two) times daily for 24 days. To continue dose / change dose per oncology recommendations. 70.5 tablet 0   dipyridamole-aspirin (AGGRENOX) 200-25 MG 12hr capsule Take  1 capsule by mouth 2 (two) times daily.     dorzolamide (TRUSOPT) 2 % ophthalmic solution Place 1 drop into both eyes 2 (two) times daily.     HYDROcodone-acetaminophen (NORCO/VICODIN) 5-325 MG tablet Take 1 tablet by mouth every 6 (six) hours as needed for moderate pain. 60 tablet 0   latanoprost (XALATAN) 0.005 % ophthalmic solution Place 1 drop into both eyes at bedtime.     megestrol (MEGACE) 40 MG tablet Take 1 tablet (40 mg total) by mouth daily. 30 tablet 1   metFORMIN (GLUCOPHAGE) 500 MG tablet Take 500 mg by  mouth 2 (two) times daily with a meal.     NONFORMULARY OR COMPOUNDED ITEM See pharmacy note 120 each 2   simvastatin (ZOCOR) 20 MG tablet Take 20 mg by mouth at bedtime.     traZODone (DESYREL) 50 MG tablet Take 50 mg by mouth at bedtime.     No current facility-administered medications for this visit.    OBJECTIVE: There were no vitals filed for this visit.    There is no height or weight on file to calculate BMI.    ECOG FS:3 - Symptomatic, >50% confined to bed  General: No acute distress.  Sitting in a wheelchair. Eyes: Pink conjunctiva, anicteric sclera. HEENT: Normocephalic, moist mucous membranes. Lungs: No audible wheezing or coughing. Heart: Regular rate and rhythm. Abdomen: Soft, nontender, no obvious distention. Musculoskeletal: No edema, cyanosis, or clubbing. Neuro: Alert, answering all questions appropriately. Cranial nerves grossly intact. Skin: No rashes or petechiae noted. Psych: Normal affect.   LAB RESULTS:  Lab Results  Component Value Date   NA 132 (L) 08/05/2021   K 4.0 08/05/2021   CL 100 08/05/2021   CO2 20 (L) 08/05/2021   GLUCOSE 110 (H) 08/05/2021   BUN 53 (H) 08/05/2021   CREATININE 1.80 (H) 08/05/2021   CALCIUM 9.4 08/05/2021   PROT 8.1 08/05/2021   ALBUMIN 3.4 (L) 08/05/2021   AST 20 08/05/2021   ALT 22 08/05/2021   ALKPHOS 39 08/05/2021   BILITOT 1.0 08/05/2021   GFRNONAA 37 (L) 08/05/2021   GFRAA >60 10/10/2019    Lab Results  Component Value Date   WBC 8.3 08/05/2021   NEUTROABS 5.3 07/19/2021   HGB 9.7 (L) 08/05/2021   HCT 31.1 (L) 08/05/2021   MCV 90.9 08/05/2021   PLT 249 08/05/2021     STUDIES: DG Chest Port 1 View  Result Date: 07/24/2021 CLINICAL DATA:  Evaluate for pneumothorax following biopsy. EXAM: PORTABLE CHEST 1 VIEW COMPARISON:  Chest radiograph 07/24/2021 at 0935 hours FINDINGS: Again noted is the mass along the medial left chest. Negative for pneumothorax. Linear densities in the mid left lung are similar to  the previous examination. No new airspace disease or consolidation. Heart and mediastinum are stable. The trachea is midline. IMPRESSION: 1. Negative for pneumothorax. 2. Stable appearance of the left lung mass. Electronically Signed   By: Markus Daft M.D.   On: 07/24/2021 12:07   CT LUNG MASS BIOPSY  Result Date: 07/24/2021 INDICATION: Lung mass. EXAM: CT-GUIDED LEFT LUNG MASS BIOPSY RADIATION DOSE REDUCTION: This exam was performed according to the departmental dose-optimization program which includes automated exposure control, adjustment of the mA and/or kV according to patient size and/or use of iterative reconstruction technique. COMPARISON:  CT CAP, 07/19/2021. MEDICATIONS: None. ANESTHESIA/SEDATION: Local anesthetic was administered. The patient was continuously monitored during the procedure by the interventional radiology nurse under my direct supervision. CONTRAST:  None COMPLICATIONS: None immediate. PROCEDURE: Informed consent  was obtained from the the patient and/or patient's representative following an explanation of the procedure, risks, benefits and alternatives. The patient understands,agrees and consents for the procedure. All questions were addressed. A time out was performed prior to the initiation of the procedure. The patient was positioned supine on the CT table and a limited chest CT was performed for procedural planning demonstrating large LEFT lung mass. The operative site was prepped and draped in the usual sterile fashion. Under sterile conditions and local anesthesia, a 17 gauge coaxial needle was advanced into the peripheral aspect of the nodule. Positioning was confirmed with intermittent CT fluoroscopy and followed by the acquisition of 3 core biopsy with an 18 gauge device. The coaxial needle was removed following deployment of a Biosentry plug and superficial hemostasis was achieved with manual compression. Limited post procedural chest CT demonstrated a small volume  pneumothorax. A dressing was placed. The patient tolerated the procedure well without immediate postprocedural complication. The patient was escorted to have an upright chest radiograph. IMPRESSION: 1. Successful CT guided core biopsy of LEFT lung mass, as above. 2. Small volume LEFT anterior postprocedure pneumothorax. PLAN: Patient remained vital signs and hemodynamically stable. Additional immediate intervention was not required. An immediate upright portable chest XR was acquired, within additional 1 hour post XR for follow-up. Michaelle Birks, MD [facility] Radiology Electronically Signed   By: Michaelle Birks M.D.   On: 07/24/2021 09:59   DG Chest Port 1 View  Result Date: 07/24/2021 CLINICAL DATA:  Status post lung biopsy EXAM: PORTABLE CHEST 1 VIEW COMPARISON:  07/19/2021 FINDINGS: Large left upper lobe pulmonary mass again noted. No definite pneumothorax. Bilateral mild interstitial thickening. Heart and mediastinal contours are unremarkable. No acute osseous abnormality. IMPRESSION: 1. Left upper lobe pulmonary mass.  No pneumothorax. Electronically Signed   By: Kathreen Devoid M.D.   On: 07/24/2021 09:49   ECHOCARDIOGRAM COMPLETE  Result Date: 07/23/2021    ECHOCARDIOGRAM REPORT   Patient Name:   ARLING CERONE Date of Exam: 07/23/2021 Medical Rec #:  [id number]          Height:       71.0 in Accession #:    [id number]         Weight:       257.9 lb Date of Birth:  1940/06/13         BSA:          2.349 m Patient Age:    39 years           BP:           120/61 mmHg Patient Gender: M                  HR:           42 bpm. Exam Location:  [facility] Procedure: 2D Echo, Cardiac Doppler and Color Doppler Indications:     Abnormal ECG R64.31  History:         Patient has no prior history of Echocardiogram examinations.                  Risk Factors:Hypertension, Diabetes and Dyslipidemia.  Sonographer:     Sherrie Sport Referring Phys:  [id number] Kate Sable  Diagnosing Phys: Kate Sable MD  Sonographer Comments: Technically challenging study due to limited acoustic windows, no parasternal window, suboptimal apical window and no subcostal window. IMPRESSIONS  1. Left ventricular ejection fraction, by estimation, is 55 to 60%. The left  ventricle has normal function. The left ventricle has no regional wall motion abnormalities. Left ventricular diastolic parameters are consistent with Grade I diastolic dysfunction (impaired relaxation).  2. Right ventricular systolic function was not well visualized. The right ventricular size is not well visualized.  3. The mitral valve is grossly normal. No evidence of mitral valve regurgitation.  4. The aortic valve was not well visualized. Aortic valve regurgitation is not visualized. FINDINGS  Left Ventricle: Left ventricular ejection fraction, by estimation, is 55 to 60%. The left ventricle has normal function. The left ventricle has no regional wall motion abnormalities. The left ventricular internal cavity size was normal in size. There is  no left ventricular hypertrophy. Left ventricular diastolic parameters are consistent with Grade I diastolic dysfunction (impaired relaxation). Right Ventricle: The right ventricular size is not well visualized. Right vetricular wall thickness was not well visualized. Right ventricular systolic function was not well visualized. Left Atrium: Left atrial size was normal in size. Right Atrium: Right atrial size was not well visualized. Pericardium: There is no evidence of pericardial effusion. Mitral Valve: The mitral valve is grossly normal. No evidence of mitral valve regurgitation. MV peak gradient, 2.5 mmHg. The mean mitral valve gradient is 1.0 mmHg. Tricuspid Valve: The tricuspid valve is not well visualized. Tricuspid valve regurgitation is not demonstrated. Aortic Valve: The aortic valve was not well visualized. Aortic valve regurgitation is not visualized. Aortic valve mean gradient  measures 2.0 mmHg. Aortic valve peak gradient measures 3.3 mmHg. Aortic valve area, by VTI measures 2.78 cm. Pulmonic Valve: The pulmonic valve was not well visualized. Pulmonic valve regurgitation is not visualized. Aorta: The aortic root was not well visualized. Venous: The inferior vena cava was not well visualized. IAS/Shunts: No atrial level shunt detected by color flow Doppler.  LEFT VENTRICLE PLAX 2D LVIDd:         4.00 cm   Diastology LVIDs:         2.40 cm   LV e' medial:    5.33 cm/s LV PW:         1.20 cm   LV E/e' medial:  10.7 LV IVS:        1.00 cm   LV e' lateral:   6.31 cm/s LVOT diam:     2.00 cm   LV E/e' lateral: 9.0 LV SV:         38 LV SV Index:   16 LVOT Area:     3.14 cm  LEFT ATRIUM             Index        RIGHT ATRIUM           Index LA diam:        2.70 cm 1.15 cm/m   RA Area:     15.50 cm LA Vol (A2C):   36.0 ml 15.32 ml/m  RA Volume:   33.80 ml  14.39 ml/m LA Vol (A4C):   63.4 ml 26.99 ml/m LA Biplane Vol: 49.1 ml 20.90 ml/m  AORTIC VALVE AV Area (Vmax):    2.54 cm AV Area (Vmean):   2.85 cm AV Area (VTI):     2.78 cm AV Vmax:           90.40 cm/s AV Vmean:          55.100 cm/s AV VTI:            0.138 m AV Peak Grad:      3.3 mmHg AV Mean Grad:  2.0 mmHg LVOT Vmax:         73.10 cm/s LVOT Vmean:        49.900 cm/s LVOT VTI:          0.122 m LVOT/AV VTI ratio: 0.88  AORTA Ao Root diam: 3.60 cm MITRAL VALVE MV Area (PHT): 4.06 cm    SHUNTS MV Area VTI:   2.44 cm    Systemic VTI:  0.12 m MV Peak grad:  2.5 mmHg    Systemic Diam: 2.00 cm MV Mean grad:  1.0 mmHg MV Vmax:       0.79 m/s MV Vmean:      49.8 cm/s MV Decel Time: 187 msec MV E velocity: 57.00 cm/s MV A velocity: 78.80 cm/s MV E/A ratio:  0.72 Kate Sable MD Electronically signed by Kate Sable MD Signature Date/Time: 07/23/2021/4:38:41 PM    Final    NM Bone Scan Whole Body  Result Date: 07/21/2021 CLINICAL DATA:  Prostate cancer suspected. PSA result on 07/19/2021 was 4.13. EXAM: NUCLEAR MEDICINE  WHOLE BODY BONE SCAN TECHNIQUE: Whole body anterior and posterior images were obtained approximately 3 hours after intravenous injection of radiopharmaceutical. RADIOPHARMACEUTICALS:  20.08 mCi Technetium-57mMDP IV COMPARISON:  CT of the chest abdomen and pelvis on 07/19/2021, CT and MRI of the head on 07/19/2021 FINDINGS: There is bilateral renal activity. Bladder activity is present. There are focal areas of increased activity in the sternoclavicular joints bilaterally, shoulders bilaterally, and RIGHT midfoot region. These are favored to represent degenerative changes. Degenerative changes are also noted in the lumbosacral junction, slightly asymmetric RIGHT greater than LEFT. There are no focal areas of increased activity to indicate presence of metastatic disease. IMPRESSION: No evidence for osseous metastatic disease. Multifocal areas of increased uptake consistent with degenerative changes. Electronically Signed   By: ENolon NationsM.D.   On: 07/21/2021 15:53   CT CHEST ABDOMEN PELVIS W CONTRAST  Result Date: 07/19/2021 CLINICAL DATA:  Mass in the cortex of the right frontal lobe concerning for metastasis. EXAM: CT CHEST, ABDOMEN, AND PELVIS WITH CONTRAST TECHNIQUE: Multidetector CT imaging of the chest, abdomen and pelvis was performed following the standard protocol during bolus administration of intravenous contrast. RADIATION DOSE REDUCTION: This exam was performed according to the departmental dose-optimization program which includes automated exposure control, adjustment of the mA and/or kV according to patient size and/or use of iterative reconstruction technique. CONTRAST:  107mOMNIPAQUE IOHEXOL 300 MG/ML  SOLN COMPARISON:  March 10, 2019 FINDINGS: CT CHEST FINDINGS Cardiovascular: There is mild calcification of the aortic arch, without evidence of aortic aneurysm. Evaluation of the segmental and subsegmental pulmonary arteries within the bilateral lower lobes is limited secondary to  patient motion. Normal heart size. No pericardial effusion. Mediastinum/Nodes: Lungs/Pleura: There is moderate severity emphysematous lung disease. A 5.9 cm x 4.2 cm x 6.0 cm heterogeneous lung mass is seen within the anteromedial aspect of the left upper lobe. This represents a new finding when compared to the prior study. There is no evidence of acute infiltrate, pleural effusion or pneumothorax. Musculoskeletal: There is evidence of prior vertebroplasty at the level of T12 vertebral body. Multilevel degenerative changes are seen throughout the thoracic spine. CT ABDOMEN PELVIS FINDINGS Hepatobiliary: A 2.8 cm x 2.9 cm ill-defined low-attenuation liver lesion is seen within the dome of the liver. Additional 0.9 cm and 0.8 cm foci of low attenuation are noted within the right lobe. There is no evidence of gallstones, gallbladder wall thickening or pericholecystic inflammation. No biliary dilatation is  identified. Pancreas: Unremarkable. No pancreatic ductal dilatation or surrounding inflammatory changes. Spleen: No splenic injury or perisplenic hematoma. Adrenals/Urinary Tract: A 1.9 cm x 1.6 cm low-attenuation left adrenal mass is seen (approximately 30.08 Hounsfield units). The right adrenal gland is unremarkable. Kidneys are normal in size, without obstructing renal calculi, focal lesion, or hydronephrosis. Bladder is unremarkable. Stomach/Bowel: There is a small hiatal hernia. Appendix appears normal. No evidence of bowel wall thickening, distention, or inflammatory changes. Noninflamed diverticula are seen within the descending and sigmoid colon. Vascular/Lymphatic: Aortic atherosclerosis. No enlarged abdominal or pelvic lymph nodes. Reproductive: The prostate gland is not clearly identified. Other: No abdominal wall hernia or abnormality. No abdominopelvic ascites. Musculoskeletal: Multilevel degenerative changes seen throughout the lumbar spine. IMPRESSION: 1. 5.9 cm x 4.2 cm x 6.0 cm heterogeneous lung mass  within the anteromedial aspect of the left upper lobe, consistent with a primary lung malignancy. 2. Moderate severity emphysematous lung disease. 3. Multiple low-attenuation liver lesions, concerning for metastatic disease. Correlation with nonemergent hepatic MRI is recommended. 4. Low-attenuation left adrenal mass which may represent an adrenal adenoma. Sequelae associated with metastatic disease cannot completely be excluded. 5. Colonic diverticulosis. 6. Evidence of prior vertebroplasty at the level of T12 vertebral body. 7. Small hiatal hernia. Aortic Atherosclerosis (ICD10-I70.0) and Emphysema (ICD10-J43.9). Electronically Signed   By: Virgina Norfolk M.D.   On: 07/19/2021 19:27   MR Brain W and Wo Contrast  Result Date: 07/19/2021 CLINICAL DATA:  History of prostate cancer. Acute presentation today after falling. Abnormal head CT. EXAM: MRI HEAD WITHOUT AND WITH CONTRAST TECHNIQUE: Multiplanar, multiecho pulse sequences of the brain and surrounding structures were obtained without and with intravenous contrast. CONTRAST:  80m GADAVIST GADOBUTROL 1 MMOL/ML IV SOLN COMPARISON:  Head CT earlier same day FINDINGS: Brain: Diffusion imaging does not show any acute or subacute infarction. No abnormality is seen affecting the brainstem or cerebellum. There is cerebral hemispheric atrophy, temporal lobe predominant. There is an 8 mm enhancing mass in the left occipital lobe with surrounding vasogenic edema. There is a 13 mm mass in the right frontal lobe with regional edema. There is a 2 mm enhancing lesion within the cerebellar vermis postcontrast axial image 46. There is a 2 mm enhancing lesion within the left occipital lobe, postcontrast image 62. There is a 3 mm enhancing lesion in the right external capsule region, postcontrast axial image 69. These most likely represent metastatic disease. Susceptibility weighted imaging shows signal loss, suggesting that there is microhemorrhage within these metastases.  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 There are mild chronic small-vessel ischemic changes affecting the white matter. No hydrocephalus. No extra-axial collection. Vascular: Major vessels at the base of the brain show flow. Skull and upper cervical spine: Negative Sinuses/Orbits: Clear/normal Other: None IMPRESSION: 13 mm micro hemorrhagic metastasis in the right frontal lobe with regional edema. 8 mm micro hemorrhagic metastasis in the left occipital lobe with regional edema. 3 mm micro hemorrhagic metastasis in the external capsule on the right with very minimal associated edema. Punctate/2 mm metastases visible in the cerebellar vermis and left occipital lobe consistent with tiny metastases. Electronically Signed   By: MNelson ChimesM.D.   On: 07/19/2021 19:14   CT Head Wo Contrast  Result Date: 07/19/2021 CLINICAL DATA:  Syncope EXAM: CT HEAD WITHOUT CONTRAST TECHNIQUE: Contiguous axial images were obtained from the base of the skull through the vertex  without intravenous contrast. RADIATION DOSE REDUCTION: This exam was performed according to the departmental dose-optimization program which includes automated exposure control, adjustment of the mA and/or kV according to patient size and/or use of iterative reconstruction technique. COMPARISON:  September 24, 2017 CT scan FINDINGS: Brain: No subdural, epidural, or subarachnoid hemorrhage. There is a mass in the right frontal lobe as identified on axial image 20 and sagittal image 30 measuring 1.2 x 1.3 cm. There is adjacent vasogenic edema. There is low attenuation in the left occipital lobe on axial image 17 which could represent a small stroke, new since February 2021. The low-attenuation could be another site of vasogenic edema but a definitive mass is not noted. No other masses identified. No midline shift. Ventricles  and sulci are unremarkable. Cerebellum, brainstem, and basal cisterns are normal. Chronic white matter changes. No acute cortical ischemia or infarct. Vascular: Calcified atherosclerotic change in the intracranial carotids. Skull: Normal. Negative for fracture or focal lesion. Sinuses/Orbits: No acute finding. Other: None. IMPRESSION: 1. There is a 1.2 x 1.3 cm mass in the cortex of the right frontal lobe medially with adjacent vasogenic edema. The finding is concerning for a metastasis. Recommend an MRI. 2. A region of low attenuation in the left occipital lobe could represent a small stroke, new since February 2021 or a second site of vasogenic edema. Recommend attention to this region on follow-up. 3. No midline shift. 4. Chronic white matter changes. Electronically Signed   By: Dorise Bullion III M.D.   On: 07/19/2021 17:03   DG Chest Port 1 View  Result Date: 07/19/2021 CLINICAL DATA:  Unwitnessed fall.  Weakness.  Hypotension. EXAM: PORTABLE CHEST 1 VIEW COMPARISON:  02/07/2017 FINDINGS: Heart size is within normal limits. A rounded masslike opacity is seen in the left mid lung measuring approximately 5 x 7 cm. This could be due to carcinoma or pulmonary contusion. No evidence of pleural effusion. Right lung is clear. IMPRESSION: Rounded masslike opacity in left mid lung, which could be due to carcinoma or pulmonary contusion. Chest CT with contrast is recommended for further evaluation. Electronically Signed   By: Marlaine Hind M.D.   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   PLAN:    1.  Stage IV non-small cell lung carcinoma: Patient noted to have brain metastasis in the hospital and has initiated XRT.  He will complete treatment on August 12, 2021.  There was only enough tissue for PD-L1 testing and this is pending at time of dictation.  Patient's performance status is declining, but he wishes to attempt  treatment.  He agrees that chemotherapy may be more detrimental, but is willing to try single agent Keytruda.  Hospice and end-of-life care were also discussed today.  Return to clinic in 1 week for further evaluation and initiation of Keytruda.  Palliative care consult also been placed. 2.  Stage IVB prostate cancer: Biopsy and subsequent RFA of the T12 bony lesion confirmed metastatic prostate cancer.  Patient's PSA was initially 213.  His most recent PSA is 4.13.  If patient does not enroll in hospice, can continue with 45 mg Eligard every 6 months.  His next dose will be due October 12, 2021. 3.  Back pain: Patient was given a prescription for Vicodin today. 4.  Poor appetite: Patient currently is on steroids which do not seem to be helping.  He was given a prescription for Megace. 5.  Anemia: Chronic and unchanged.  Patient's hemoglobin is 9.7 today.   Patient expressed understanding and was in agreement with this plan. He also understands that He can call clinic at any time with any questions, concerns, or complaints.    Cancer Staging  Prostate cancer [facility]) Staging form: Prostate, AJCC 8th Edition - Clinical stage from 04/11/2020: Stage IVB (cTX, cN0, cM1b, PSA: 213) - Signed by Lloyd Huger, MD on 04/11/2020 Prostate specific antigen (PSA) range: 20 or greater   Lloyd Huger, MD   08/08/2021 1:43 PM

## 2021-08-11 ENCOUNTER — Telehealth: Payer: Self-pay | Admitting: Oncology

## 2021-08-11 ENCOUNTER — Inpatient Hospital Stay: Payer: Medicare Other

## 2021-08-11 ENCOUNTER — Ambulatory Visit: Payer: Medicare Other

## 2021-08-11 ENCOUNTER — Inpatient Hospital Stay (HOSPITAL_BASED_OUTPATIENT_CLINIC_OR_DEPARTMENT_OTHER): Payer: Medicare Other | Admitting: Hospice and Palliative Medicine

## 2021-08-11 ENCOUNTER — Other Ambulatory Visit: Payer: Self-pay

## 2021-08-11 ENCOUNTER — Telehealth: Payer: Self-pay | Admitting: *Deleted

## 2021-08-11 VITALS — BP 84/59

## 2021-08-11 VITALS — BP 73/54 | Temp 97.6°F

## 2021-08-11 DIAGNOSIS — E119 Type 2 diabetes mellitus without complications: Secondary | ICD-10-CM | POA: Diagnosis not present

## 2021-08-11 DIAGNOSIS — C3412 Malignant neoplasm of upper lobe, left bronchus or lung: Secondary | ICD-10-CM | POA: Insufficient documentation

## 2021-08-11 DIAGNOSIS — C7931 Secondary malignant neoplasm of brain: Secondary | ICD-10-CM | POA: Diagnosis not present

## 2021-08-11 DIAGNOSIS — C61 Malignant neoplasm of prostate: Secondary | ICD-10-CM

## 2021-08-11 DIAGNOSIS — E860 Dehydration: Secondary | ICD-10-CM | POA: Diagnosis not present

## 2021-08-11 DIAGNOSIS — I10 Essential (primary) hypertension: Secondary | ICD-10-CM | POA: Diagnosis not present

## 2021-08-11 DIAGNOSIS — Z515 Encounter for palliative care: Secondary | ICD-10-CM

## 2021-08-11 LAB — CBC WITH DIFFERENTIAL/PLATELET
Abs Immature Granulocytes: 0.03 10*3/uL (ref 0.00–0.07)
Basophils Absolute: 0 10*3/uL (ref 0.0–0.1)
Basophils Relative: 0 %
Eosinophils Absolute: 0.3 10*3/uL (ref 0.0–0.5)
Eosinophils Relative: 5 %
HCT: 32.4 % — ABNORMAL LOW (ref 39.0–52.0)
Hemoglobin: 10.4 g/dL — ABNORMAL LOW (ref 13.0–17.0)
Immature Granulocytes: 0 %
Lymphocytes Relative: 23 %
Lymphs Abs: 1.7 10*3/uL (ref 0.7–4.0)
MCH: 28.1 pg (ref 26.0–34.0)
MCHC: 32.1 g/dL (ref 30.0–36.0)
MCV: 87.6 fL (ref 80.0–100.0)
Monocytes Absolute: 0.6 10*3/uL (ref 0.1–1.0)
Monocytes Relative: 8 %
Neutro Abs: 4.5 10*3/uL (ref 1.7–7.7)
Neutrophils Relative %: 64 %
Platelets: 300 10*3/uL (ref 150–400)
RBC: 3.7 MIL/uL — ABNORMAL LOW (ref 4.22–5.81)
RDW: 17.5 % — ABNORMAL HIGH (ref 11.5–15.5)
WBC: 7.1 10*3/uL (ref 4.0–10.5)
nRBC: 0 % (ref 0.0–0.2)

## 2021-08-11 LAB — COMPREHENSIVE METABOLIC PANEL
ALT: 54 U/L — ABNORMAL HIGH (ref 0–44)
AST: 79 U/L — ABNORMAL HIGH (ref 15–41)
Albumin: 3.4 g/dL — ABNORMAL LOW (ref 3.5–5.0)
Alkaline Phosphatase: 56 U/L (ref 38–126)
Anion gap: 11 (ref 5–15)
BUN: 57 mg/dL — ABNORMAL HIGH (ref 8–23)
CO2: 21 mmol/L — ABNORMAL LOW (ref 22–32)
Calcium: 10 mg/dL (ref 8.9–10.3)
Chloride: 106 mmol/L (ref 98–111)
Creatinine, Ser: 2.07 mg/dL — ABNORMAL HIGH (ref 0.61–1.24)
GFR, Estimated: 32 mL/min — ABNORMAL LOW (ref >60)
Glucose, Bld: 148 mg/dL — ABNORMAL HIGH (ref 70–99)
Potassium: 4.3 mmol/L (ref 3.5–5.1)
Sodium: 138 mmol/L (ref 135–145)
Total Bilirubin: 1.1 mg/dL (ref 0.3–1.2)
Total Protein: 8.3 g/dL — ABNORMAL HIGH (ref 6.5–8.1)

## 2021-08-11 MED ORDER — SODIUM CHLORIDE 0.9 % IV SOLN
INTRAVENOUS | Status: AC
  Filled 2021-08-11 (×2): qty 250

## 2021-08-11 NOTE — Telephone Encounter (Signed)
Spoke to granddaughter. She is agreeable to [facility]. Appointments scheduled.

## 2021-08-12 ENCOUNTER — Ambulatory Visit: Payer: Medicare Other

## 2021-08-12 ENCOUNTER — Inpatient Hospital Stay: Payer: Medicare Other

## 2021-08-12 ENCOUNTER — Encounter: Payer: Self-pay | Admitting: *Deleted

## 2021-08-13 ENCOUNTER — Ambulatory Visit: Payer: Medicare Other

## 2021-08-14 ENCOUNTER — Inpatient Hospital Stay: Payer: Medicare Other

## 2021-08-14 ENCOUNTER — Inpatient Hospital Stay: Payer: Medicare Other | Admitting: Oncology

## 2021-08-14 ENCOUNTER — Ambulatory Visit: Payer: Medicare Other

## 2021-08-14 ENCOUNTER — Inpatient Hospital Stay: Payer: Medicare Other | Admitting: Hospice and Palliative Medicine

## 2021-08-14 DIAGNOSIS — C3492 Malignant neoplasm of unspecified part of left bronchus or lung: Secondary | ICD-10-CM

## 2021-08-15 ENCOUNTER — Emergency Department
Admission: EM | Admit: 2021-08-15 | Discharge: 2021-08-15 | Disposition: A | Discharge status: Home / Self Care | Payer: Medicare Other | Attending: Emergency Medicine | Admitting: Emergency Medicine

## 2021-08-15 ENCOUNTER — Other Ambulatory Visit: Payer: Self-pay

## 2021-08-15 ENCOUNTER — Other Ambulatory Visit: Payer: Medicare Other

## 2021-08-15 ENCOUNTER — Ambulatory Visit: Payer: Medicare Other

## 2021-08-15 DIAGNOSIS — E860 Dehydration: Secondary | ICD-10-CM

## 2021-08-15 DIAGNOSIS — R531 Weakness: Secondary | ICD-10-CM | POA: Insufficient documentation

## 2021-08-15 DIAGNOSIS — S8992XA Unspecified injury of left lower leg, initial encounter: Secondary | ICD-10-CM | POA: Diagnosis present

## 2021-08-15 DIAGNOSIS — S80212A Abrasion, left knee, initial encounter: Secondary | ICD-10-CM | POA: Diagnosis not present

## 2021-08-15 DIAGNOSIS — Y92002 Bathroom of unspecified non-institutional (private) residence single-family (private) house as the place of occurrence of the external cause: Secondary | ICD-10-CM | POA: Insufficient documentation

## 2021-08-15 DIAGNOSIS — D649 Anemia, unspecified: Secondary | ICD-10-CM | POA: Diagnosis not present

## 2021-08-15 DIAGNOSIS — W1811XA Fall from or off toilet without subsequent striking against object, initial encounter: Secondary | ICD-10-CM | POA: Diagnosis not present

## 2021-08-15 DIAGNOSIS — Z515 Encounter for palliative care: Secondary | ICD-10-CM

## 2021-08-15 DIAGNOSIS — N179 Acute kidney failure, unspecified: Secondary | ICD-10-CM | POA: Insufficient documentation

## 2021-08-15 LAB — CBC
HCT: 33.6 % — ABNORMAL LOW (ref 39.0–52.0)
Hemoglobin: 10 g/dL — ABNORMAL LOW (ref 13.0–17.0)
MCH: 27.7 pg (ref 26.0–34.0)
MCHC: 29.8 g/dL — ABNORMAL LOW (ref 30.0–36.0)
MCV: 93.1 fL (ref 80.0–100.0)
Platelets: 294 10*3/uL (ref 150–400)
RBC: 3.61 MIL/uL — ABNORMAL LOW (ref 4.22–5.81)
RDW: 18.2 % — ABNORMAL HIGH (ref 11.5–15.5)
WBC: 7.8 10*3/uL (ref 4.0–10.5)
nRBC: 0 % (ref 0.0–0.2)

## 2021-08-15 LAB — BASIC METABOLIC PANEL
Anion gap: 9 (ref 5–15)
BUN: 39 mg/dL — ABNORMAL HIGH (ref 8–23)
CO2: 24 mmol/L (ref 22–32)
Calcium: 10.4 mg/dL — ABNORMAL HIGH (ref 8.9–10.3)
Chloride: 113 mmol/L — ABNORMAL HIGH (ref 98–111)
Creatinine, Ser: 1.62 mg/dL — ABNORMAL HIGH (ref 0.61–1.24)
GFR, Estimated: 42 mL/min — ABNORMAL LOW (ref >60)
Glucose, Bld: 137 mg/dL — ABNORMAL HIGH (ref 70–99)
Potassium: 5.3 mmol/L — ABNORMAL HIGH (ref 3.5–5.1)
Sodium: 146 mmol/L — ABNORMAL HIGH (ref 135–145)

## 2021-08-15 LAB — CBG MONITORING, ED: Glucose-Capillary: 134 mg/dL — ABNORMAL HIGH (ref 70–99)

## 2021-08-15 MED ORDER — SODIUM CHLORIDE 0.9 % IV BOLUS
500.0000 mL | Freq: Once | INTRAVENOUS | Status: AC
  Administered 2021-08-15: 500 mL via INTRAVENOUS

## 2021-08-15 MED ORDER — LACTATED RINGERS IV BOLUS
1000.0000 mL | Freq: Once | INTRAVENOUS | Status: AC
  Administered 2021-08-15: 1000 mL via INTRAVENOUS

## 2021-08-15 NOTE — Progress Notes (Addendum)
ED 12 [facility])       This patient is a current hospice patient with [facility], admitted with a terminal diagnosis non-small cell lung cancer.   [facility] will continue to follow for any discharge planning needs and to coordinate continuation of hospice care.     Please call with any questions/concerns.    Thank you for the opportunity to participate in this patient's care.  Daphene Calamity, MSW [facility] Liaison  [phone]

## 2021-08-15 NOTE — ED Notes (Addendum)
Called out to lobby to locate family. None present at this time.  Called son, Mcgeehan, at number listed in chart.  Unable to leave message d/t voice mailbox being full.

## 2021-08-15 NOTE — ED Notes (Signed)
Pt to ED for weakness and fall today, pt states he just felt weak and was unable to keep balance. Pt has brain, prostate, and lung cancer. Pt and pt family talking about hospice. Pt denies any pain at this time. Pt is A&Ox4.

## 2021-08-15 NOTE — ED Provider Triage Note (Signed)
Emergency Medicine Provider Triage Evaluation Note  OSAMA COLESON , a 81 y.o. male  was evaluated in triage.  Pt complains of weakness for the past week. Brother was helping him to the bathroom when he started to fall. Brother controlled the fall, but couldn't prevent it. No head strike or loss of consciousness.  Physical Exam  BP (!) 130/109 (BP Location: Left Arm)   Pulse 63   Temp 98.4 F (36.9 C) (Oral)   Resp 20   Wt 101.2 kg   SpO2 94%   BMI 31.10 kg/m  Gen:   Awake, no distress   Resp:  Normal effort  MSK:   Moves extremities without difficulty  Other:    Medical Decision Making  Medically screening exam initiated at 11:52 AM.  Appropriate orders placed.  MARCO RAPER was informed that the remainder of the evaluation will be completed by another provider, this initial triage assessment does not replace that evaluation, and the importance of remaining in the ED until their evaluation is complete.    Victorino Dike, FNP 08/15/21 1203

## 2021-08-15 NOTE — ED Triage Notes (Signed)
Arrived by EMS from home. Patient c/o weakness and dizziness. Brain and prostate stage 4 lung cancer. Cancer treatment cancelled Monday due to hypotension and was given fluids and sent home. EMS vitals 97.2oral, 166 blood sugar, 124/78 blood pressure. EMS administered 100cc fluid and 4mg  zofran. 22G right hand

## 2021-08-15 NOTE — ED Provider Notes (Signed)
[facility] Provider Note    Event Date/Time   First MD Initiated Contact with Patient 08/15/21 1543     (approximate)   History   Weakness   HPI  Evan Gonzalez is a 81 y.o. male who presents to the ED for evaluation of Weakness   I review palliative care visit from 4 days ago.  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  S/p radiation to the brain.  Poor candidate for chemo but patient did just start pembrolizumab.  Discussion regarding hospice and comfort care was had and apparently patient and family were agreeable with this.  Planning to keep him home as long as possible and then progressing to inpatient hospice.  DNR order signed.  Patient presents to the ED from home by EMS for evaluation of increasing weakness.  After getting up from the toilet, patient reports feeling weak in both of his legs gave out so he crumpled to the ground.  Denies any syncope.  He was unable to get up so family called 911.  Patient reports feeling weak and is thirsty, requesting water.  Denies any pain.  Long discussion with family and hospice providers.  They just want him checked out and plan to continue hospice measures at home.  They have a hospital bed being delivered tomorrow.  Physical Exam   Triage Vital Signs: ED Triage Vitals  Enc Vitals Group     BP 08/15/21 1144 (!) 130/109     Pulse Rate 08/15/21 1144 63     Resp 08/15/21 1144 18     Temp 08/15/21 1144 98.4 F (36.9 C)     Temp Source 08/15/21 1144 Oral     SpO2 08/15/21 1144 100 %     Weight 08/15/21 1150 223 lb (101.2 kg)     Height --      Head Circumference --      Peak Flow --      Pain Score 08/15/21 1150 0     Pain Loc --      Pain Edu? --      Excl. in [place]? --     Most recent vital signs: Vitals:   08/15/21 1149 08/15/21 1202  BP:  (!) 93/58  Pulse:    Resp: 20   Temp:    SpO2: 94%     General: Awake, no distress.  Weak and chronically ill-appearing.   Pleasant.  Follows commands in all 4. CV:  Good peripheral perfusion.  Resp:  Normal effort.  Abd:  No distention.  Soft and benign MSK:   Has a small, 1 cm, superficial abrasion to the left patella without bony step-offs, laceration or impaired range of motion.  Otherwise no signs of trauma or deformity to the 4 extremities. Neuro:  No focal deficits appreciated. Other:     ED Results / Procedures / Treatments   Labs (all labs ordered are listed, but only abnormal results are displayed) Labs Reviewed  BASIC METABOLIC PANEL - Abnormal; Notable for the following components:      Result Value   Sodium 146 (*)    Potassium 5.3 (*)    Chloride 113 (*)    Glucose, Bld 137 (*)    BUN 39 (*)    Creatinine, Ser 1.62 (*)    Calcium 10.4 (*)    GFR, Estimated 42 (*)    All other components within normal limits  CBC - Abnormal; Notable for the following components:  RBC 3.61 (*)    Hemoglobin 10.0 (*)    HCT 33.6 (*)    MCHC 29.8 (*)    RDW 18.2 (*)    All other components within normal limits  CBG MONITORING, ED - Abnormal; Notable for the following components:   Glucose-Capillary 134 (*)    All other components within normal limits  URINALYSIS, ROUTINE W REFLEX MICROSCOPIC    EKG Tremulous baseline makes fine detail difficult.  Seems to demonstrate a sinus rhythm with a rate of 99 bpm.  Narrow QRS without clear interval changes.  No obvious STEMI.  RADIOLOGY   Official radiology report(s): No results found.  PROCEDURES and INTERVENTIONS:  .1-3 Lead EKG Interpretation  Performed by: Vladimir Crofts, MD Authorized by: Vladimir Crofts, MD     Interpretation: normal     ECG rate:  96   ECG rate assessment: normal     Rhythm: sinus rhythm     Ectopy: none     Conduction: normal     Medications  sodium chloride 0.9 % bolus 500 mL (500 mLs Intravenous New Bag/Given 08/15/21 1205)     IMPRESSION / MDM / ASSESSMENT AND PLAN / ED COURSE  I reviewed the triage vital signs and  the nursing notes.  Differential diagnosis includes, but is not limited to, dehydration, AKI, stroke, hip fracture  {Patient presents with symptoms of an acute illness or injury that is potentially life-threatening.  81 year old male presents to the ED with acute on chronic weakness in the setting of home hospice care.  He is evidence of an AKI on his metabolic panel, chronic anemia on his CBC.  EKG without interval changes and no dysrhythmias on the monitor is to suggest cardiogenic syncope.  Low suspicion clinically for any fracture based on his clinical exam.  We will provide IV fluids and return to home hospice, as indicated below.  Discussed return precautions with the family.  Clinical Course as of 08/15/21 1712  Fri Aug 15, 2021  1559 I called hospice and they provide me with information for the hospital liaison below [facility], [phone].  I call her, leave VM.  [DS]  [id number] from [place] by [place], they can get a hospital bed at home. Go home first, not quite ready for hospice house.  [DS]  [id number] .  Son and granddaughter at the bedside.  We discussed plan of care.  They are agreeable with a liter of IV fluids and then returning home with increased resources through [employer].  We reviewed hospice, comfort measures, ED precautions [DS]  1710 I discussed with nursing staff.  They are having trouble getting an IV because his veins are small and is dry.  Another nurse will try [DS]    Clinical Course User Index [DS] Vladimir Crofts, MD     FINAL CLINICAL IMPRESSION(S) / ED DIAGNOSES   Final diagnoses:  None     Rx / DC Orders   ED Discharge Orders     None        Note:  This document was prepared using Dragon voice recognition software and may include unintentional dictation errors.   Vladimir Crofts, MD 08/15/21 [phone]

## 2021-08-27 ENCOUNTER — Other Ambulatory Visit: Payer: Self-pay | Admitting: Oncology

## 2021-08-27 DIAGNOSIS — C61 Malignant neoplasm of prostate: Secondary | ICD-10-CM

## 2021-08-27 DIAGNOSIS — C7931 Secondary malignant neoplasm of brain: Secondary | ICD-10-CM

## 2021-08-29 ENCOUNTER — Telehealth: Payer: Self-pay | Admitting: *Deleted

## 2021-08-29 ENCOUNTER — Other Ambulatory Visit: Payer: Self-pay | Admitting: Hospice and Palliative Medicine

## 2021-08-29 MED ORDER — MORPHINE SULFATE (CONCENTRATE) 10 MG /0.5 ML PO SOLN: 5.0000 mg | ORAL | 0 refills | Status: AC | PRN

## 2021-08-29 MED ORDER — NYSTATIN 100000 UNIT/ML MT SUSP: 5.0000 mL | Freq: Four times a day (QID) | OROMUCOSAL | 0 refills | Status: AC

## 2021-08-29 NOTE — Progress Notes (Signed)
Hospice nurse, Ashby Dawes, patient is having more difficulty swallowing pills.  She requested to rotate from Norco to morphine elixir for pain/dyspnea.  Additionally, patient has thrush and she requested prescription for nystatin.  Rx sent to pharmacy.

## 2021-08-29 NOTE — Telephone Encounter (Signed)
CAll from [place] with hospice asking for comfort orders of Morphine and Hyothiamine and also something for thrush for this patient.

## 2021-08-29 NOTE — Telephone Encounter (Signed)
I spoke with her.  Rx given.

## 2021-09-02 ENCOUNTER — Telehealth: Payer: Self-pay | Admitting: *Deleted

## 2021-09-16 NOTE — Telephone Encounter (Signed)
Call from [place] reporting that patient passed away in his home at 4:01 AM this morning

## 2021-09-16 DEATH — deceased

## 2021-09-17 ENCOUNTER — Ambulatory Visit: Payer: Medicare Other | Admitting: Radiation Oncology

## 2021-10-13 ENCOUNTER — Ambulatory Visit: Payer: Medicare Other | Admitting: Podiatry

## 2021-10-14 ENCOUNTER — Ambulatory Visit: Payer: Medicare Other | Admitting: Oncology

## 2021-10-14 ENCOUNTER — Other Ambulatory Visit: Payer: Medicare Other

## 2021-10-14 ENCOUNTER — Ambulatory Visit: Payer: Medicare Other
